# Patient Record
Sex: Female | Born: 1950 | Race: Black or African American | Hispanic: No | Marital: Married
Health system: Southern US, Community
[De-identification: ages and names within clinical notes are randomized; demographics above are authoritative.]

---

## 1998-06-10 ENCOUNTER — Other Ambulatory Visit: Admission: RE | Admit: 1998-06-10 | Discharge: 1998-06-10 | Payer: Self-pay | Admitting: Family Medicine

## 2006-01-08 ENCOUNTER — Other Ambulatory Visit: Admission: RE | Admit: 2006-01-08 | Discharge: 2006-01-08 | Payer: Self-pay | Admitting: Family Medicine

## 2008-03-01 ENCOUNTER — Ambulatory Visit: Payer: Self-pay

## 2008-03-01 ENCOUNTER — Ambulatory Visit: Payer: Self-pay | Admitting: Cardiology

## 2008-03-16 ENCOUNTER — Ambulatory Visit: Payer: Self-pay

## 2008-03-16 ENCOUNTER — Encounter: Payer: Self-pay | Admitting: Cardiology

## 2008-03-16 ENCOUNTER — Ambulatory Visit: Payer: Self-pay | Admitting: Cardiology

## 2008-03-16 LAB — CONVERTED CEMR LAB: Pro B Natriuretic peptide (BNP): 70 pg/mL (ref 0.0–100.0)

## 2010-11-28 NOTE — Letter (Signed)
March 16, 2008    [name]  [address]  [place], [place] [address]   RE:  NYNA, CHILTON  MRN:  [id number]  /  DOB:  03-Dec-1950   DICTATING PHYSICIAN:  Loralie Champagne, MD   Dear Dr. 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  The patient did  have an event monitor, with several readings available to me today.  All  of these showed normal sinus rhythm and no evidence for a tachy or brady  arrhythmia.  The patient did perform an exercise treadmill test today,  which showed a poor overall exercise tolerance.  She stopped after 3  minutes and 4 seconds due to dyspnea likely due to her underlying lung  disease.  Her baseline rhythm was sinus with an intraventricular  conduction delay and a short PR interval, which may be consistent with a  Wolff-Parkinson-White type picture.  At her maximum heart rate of 130  beats per minute, the QRS complex had shortened slightly and the PR  interval had lengthened slightly as well.  This suggests that there is  some decrement in conduction along the possible accessory pathway with  increased heart rate.  However, there does still appear to be some  conduction down the accessory pathway even at a heart rate of up to 130  beats per minute.  The patient additionally had an echocardiogram done  here in the office today, which showed normal left ventricular size and  overall normal ejection fraction; EF is estimated at 55%.  There was  paradoxical septal motion consistent with an intraventricular conduction  delay, however, the septum did thicken and does not appear to be  hypokinetic.  The right ventricle had normal size and function and there  was mild mitral and tricuspid regurgitation as well.  Finally it was  noted that on the treadmill, the patient's blood pressure rose up to a  maximum of 201/109, which is significantly elevated.  I spoke with  the  patient after all of her tests were done.  I did suggest that she start  on ablood pressure medication such as amlodipine, however, she declined  blood pressure medication.  The patient did decline followup here and  states that she will return to see you in your office.  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  She does not have LV systolic dysfunction.  She was  on event monitor for number of weeks with no events.  Per her report,  she has never been symptomatic from the standpoint of having tachy  arrhythmias due to WPW.  So, I think at this time the best course would  be to follow her with observation.   Thank you for the opportunity to care for this patient with you.   Dalton Aundra Dubin    Sincerely,      Loralie Champagne, MD  Electronically Signed    DM/MedQ  DD: 03/16/2008  DT: 03/17/2008  Job #: [id number]   CC:    Vanna Scotland. Olevia Perches, MD, [facility]

## 2010-11-28 NOTE — Procedures (Signed)
[facility]                              EXERCISE TREADMILL   NAME:Kristen Montgomery, Kristen Montgomery                        MRN:          [id number]  DATE:03/16/2008                            DOB:          1951/05/20    INDICATION:  Possible Wolff-Parkinson-White syndrome; please assess for  response of the QRS duration to exercise.   PROCEDURE NOTE:  After informed consent was obtained, the patient  exercised on the treadmill using standard Bruce protocol.  She exercised  for 3 minutes and 4 seconds, achieving 4.60 METS.  The resting heart  rate rose from 78 to 134 beats per minute, representing 81% of the  maximum predicted heart rate.  The resting blood pressure of 134/94,  rose to maximum blood pressure of 202/109.  The patient did reach stage  II of the Bruce protocol. Testing was stopped due to shortness of  breath.  During the test at baseline, the patient's EKG showed sinus  rhythm with a short PR and a widened QRs.  This pattern was thought to  be suggestive of Wolff-Parkinson-White with an accessory pathway.  With  exercise, the patient's heart rate reached up to about 130 beats per  minute.  The QRS complex did narrow somewhat with exercise suggesting a  decremental response of the accessory pathway and some degree of  refractoriness with increased heart rate.   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  The  accessory pathway does seem to be somewhat more refractory at higher  heart rates, as the QRS complex does narrow a bit, however, does not  narrow completely to normal, so there still appears to be some degree of  conduction down the accessory pathway even at higher heart rates.  Also,  it is noted the patient has very poor exercise tolerance; this is  thought to be probably due to her underlying lung disease.     Loralie Champagne, MD  Electronically Signed    DM/MedQ  DD: 03/16/2008  DT: 03/17/2008  Job #: [id number]   cc:   [name], M.D.  Bruce Alfonso Patten Olevia Perches, MD, [facility]

## 2010-11-28 NOTE — Assessment & Plan Note (Signed)
[facility]                            CARDIOLOGY OFFICE NOTE   NAME:Kristen Montgomery, Kristen Montgomery                        MRN:          [id number]  DATE:02/29/2008                            DOB:          [birth year]    PRIMARY CARE PHYSICIAN:  [name], M.D.   HISTORY OF PRESENT ILLNESS:  This is a 60 year old with history of COPD  and smoking who presents to Cardiology Clinic for evaluation of abnormal  EKG at baseline.  The patient does not have any episodes of chest pain.  She does have some dyspnea on exertion that is thought to be related to  her COPD.  If she climbs a flight of steps, she does get short of  breath.  She does not tend to have shortness of breath walking on flat  ground.  She has no orthopnea and no PND.  She was noted by her primary  care physician to have an abnormal EKG that shows a short PR interval  and a possible delta wave consistent with WPW.  She states that she gets  occasional fluttering in her chest and this happens on rare intervals  and tends to be every 6 months to a year (episodes are not common at  all).  They last for about 15 minutes at a time and are not associated  with lightheadedness or syncope.  In fact, she has never had an episode  of syncope or presyncope.  She noted that she does not have any known  heart problems.   PAST MEDICAL HISTORY:  1. COPD.  The patient is an active smoker.  She did stop smoking in      the past using Chantix.  However, she did gain a lot of weight      after she stopped smoking, so she started smoking again.  2. Possible WPW   SOCIAL HISTORY:  The patient is a smoker.  She smokes about a pack a  day.  She drinks alcohol rarely.  She does not use any illicit drugs.  She does work in a factory.  She is married.   FAMILY HISTORY:  The patient's parents are relatively healthy except for  the fact that her mother has some mild COPD.  There is no family history  of premature coronary  artery disease.   REVIEW OF SYSTEMS:  Negative except as noted in the history of present  illness.   MEDICATIONS:  The patient takes  1. Advair 250/50 b.i.d.  2. Singulair 10 mg daily.  3. Albuterol p.r.n.   EKG today normal sinus rhythm.  There is a short PR with what appears to  be a delta wave.  There is a left bundle-branch block pattern.   PHYSICAL EXAMINATION:  VITAL SIGNS:  Blood pressure 133/85, heart rate  85 and regular, weight is 145 pounds.  GENERAL:  No apparent distress.  This is a well-developed female.  NEURO:  Alert and oriented x3.  Normal affect.  LUNGS:  Distant breath sounds, but clear and normal respiratory effort.  NECK:  JVP does appear to be elevated at  about 10-12 cm of water.  There  is no thyromegaly or thyroid nodule.  ABDOMEN:  Soft, nontender.  No hepatosplenomegaly.  Normal bowel sounds.  HEART:  Regular S1 and S2, no S3, no S4.  There is a slight 1/6 systolic  murmur at the left sternal border.  There are no carotid bruits.  There  is no pedal edema.  EXTREMITIES:  No clubbing, cyanosis.  HEENT:  Normal.  SKIN:  Normal.  MUSCULOSKELETAL:  Normal.   ASSESSMENT AND PLAN:  This is a 60 year old with history of COPD and  active smoking who is found to have an abnormal EKG with left bundle-  branch pattern/short PR and possible delta wave.  1. Abnormal EKG.  The patient's EKG does appear consistent with a      Wolff-Parkinson-White pattern but she does not have a whole lot of      symptoms consistent with arrhythmia.  She does have very rare      episodes of palpitations every 65-monthto a year that are not      associated with syncope or lightheadedness.  She has never passed      out.  I do think she needs some further evaluation, given the      abnormal EKG.  I will go ahead and get an echocardiogram to look      for any structural heart abnormalities.  We will set her up with an      event monitor that she can wear for a month.  We will see if  she      has any evidence for AVRT or atrial fibrillation related to WPW.  I      somewhat doubt that we will find either given her general lack of      symptoms.  We will also obtain an exercise treadmill test to follow      her PR interval and the delta wave with exercise.  It may be that      this is weak pathway with decreasing conduction as heart rate      increases.  In this situation, we would see loss of the delta wave      at higher heart rates.  2. I did encourage the patient to stop smoking.  She does have a      prescription for Chantix at home.  I did encourage her to try using      it again as it did work to stop her smoking the first time.  3. We will check fasting lipids when she returns.  4. The patient does appear to have elevated neck veins on exam.  We      are planning on getting an echocardiogram.  We will also check a      BNP.  She does have some shortness of breath on exertion,      especially with climbing stairs, this has been related to her COPD      in the past.     DLoralie Champagne MD  Electronically Signed    DM/MedQ  DD: 03/01/2008  DT: 03/02/2008  Job #: [id number]  cc:   PJudithe Modest M.D.  Thomas C. Wall, MD, [facility]

## 2011-01-16 ENCOUNTER — Ambulatory Visit: Payer: Self-pay | Admitting: Sports Medicine

## 2011-08-13 ENCOUNTER — Ambulatory Visit: Payer: Self-pay | Admitting: Orthopedic Surgery

## 2011-08-13 DIAGNOSIS — I10 Essential (primary) hypertension: Secondary | ICD-10-CM

## 2011-08-13 LAB — BASIC METABOLIC PANEL
BUN: 11 mg/dL (ref 7–18)
Chloride: 107 mmol/L (ref 98–107)
EGFR (Non-African Amer.): >60
Glucose: 89 mg/dL (ref 65–99)
Potassium: 4.2 mmol/L (ref 3.5–5.1)
Sodium: 144 mmol/L (ref 136–145)

## 2011-08-13 LAB — CBC
HCT: 39.3 % (ref 35.0–47.0)
HGB: 13.1 g/dL (ref 12.0–16.0)
MCHC: 33.2 g/dL (ref 32.0–36.0)
RBC: 4.48 10*6/uL (ref 3.80–5.20)

## 2011-08-13 LAB — PROTIME-INR
INR: 0.9
Prothrombin Time: 12.4 secs (ref 11.5–14.7)

## 2011-08-21 ENCOUNTER — Inpatient Hospital Stay: Payer: Self-pay | Admitting: Orthopedic Surgery

## 2011-08-22 LAB — BASIC METABOLIC PANEL
Anion Gap: 9 (ref 7–16)
BUN: 9 mg/dL (ref 7–18)
Creatinine: 0.65 mg/dL (ref 0.60–1.30)
EGFR (Non-African Amer.): >60
Glucose: 103 mg/dL — ABNORMAL HIGH (ref 65–99)
Osmolality: 271 (ref 275–301)
Potassium: 5 mmol/L (ref 3.5–5.1)

## 2011-08-22 LAB — PLATELET COUNT: Platelet: 208 10*3/uL (ref 150–440)

## 2012-05-02 IMAGING — CT CT CHEST W/ CM
1 series · 15 of 32 positions shown, 19 images · IV contrast (APPLIED)
Comparison: none

REASON FOR EXAM: acute hypxia
COMMENTS:

[Series 4: soft tissue · axial · 0.64mm/px · z∈[-31,+230]mm · 15 of 99 slices shown, 19 images]
[im 8/99  mediastinal]
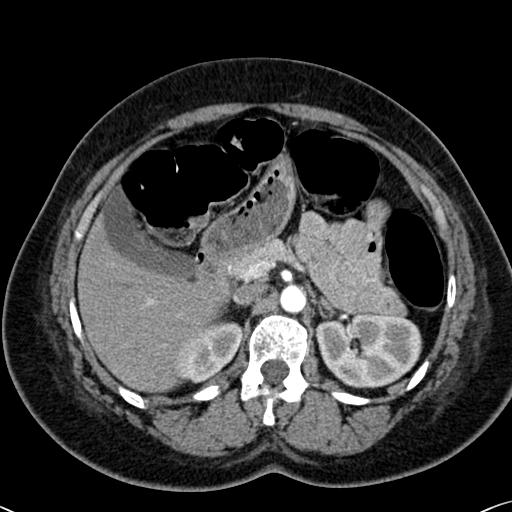
[im 8/99  lung]
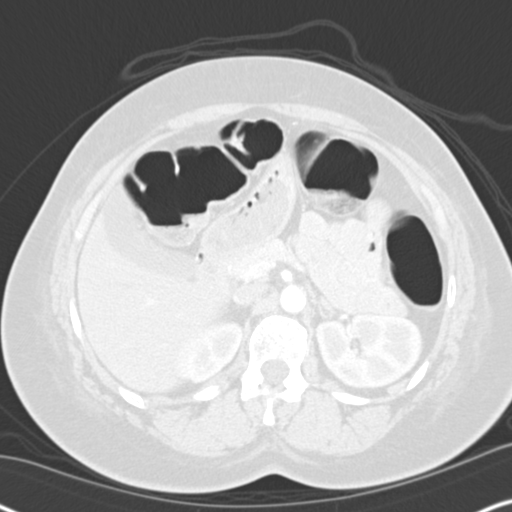
[im 15/99  lung]
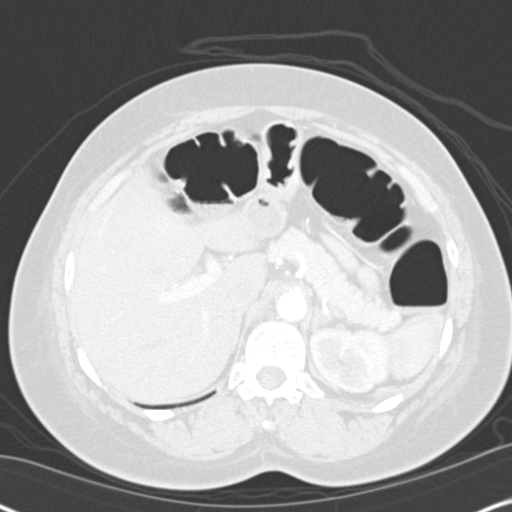
[im 20/99  lung]
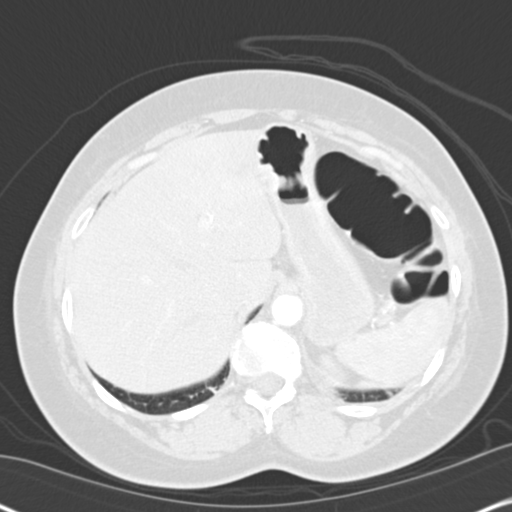
[im 26/99  lung]
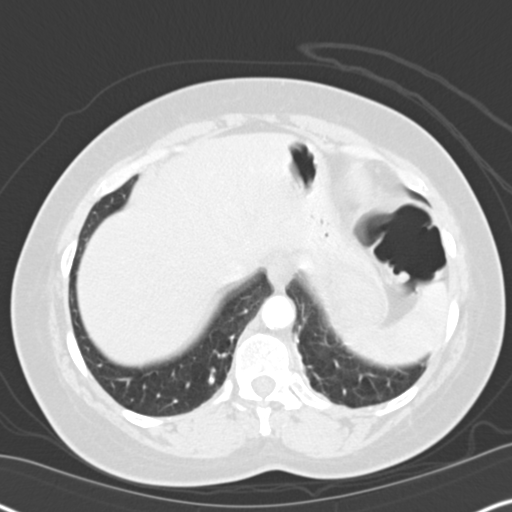
[im 33/99  mediastinal]
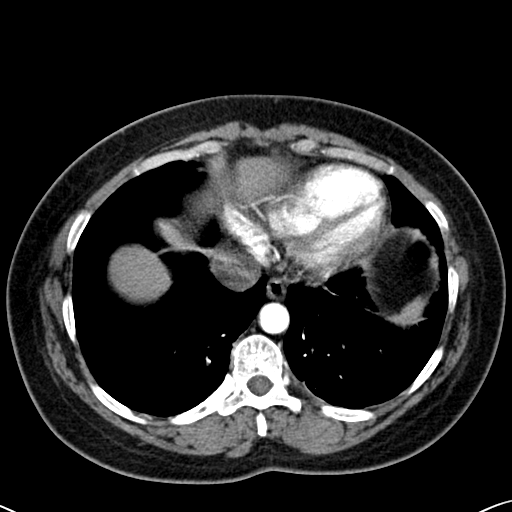
[im 33/99  lung]
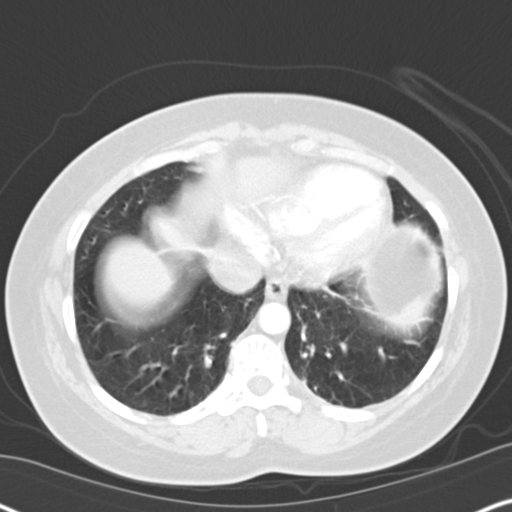
[im 40/99  lung]
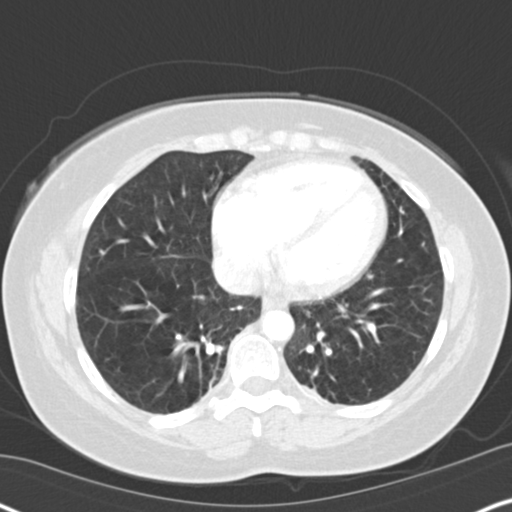
[im 47/99  lung]
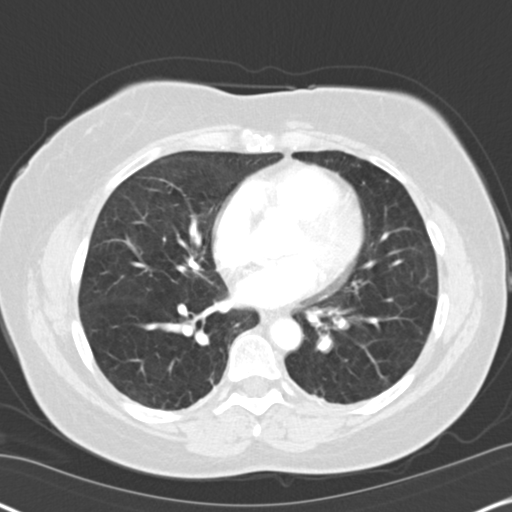
[im 51/99  lung]
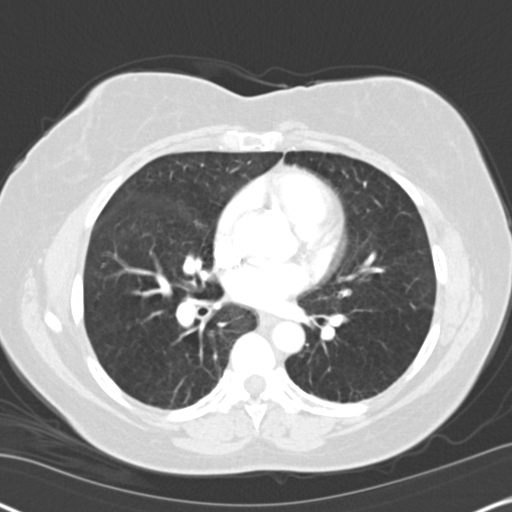
[im 55/99  mediastinal]
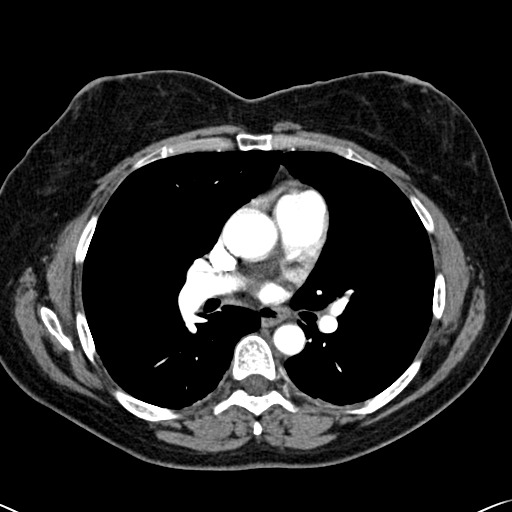
[im 55/99  lung]
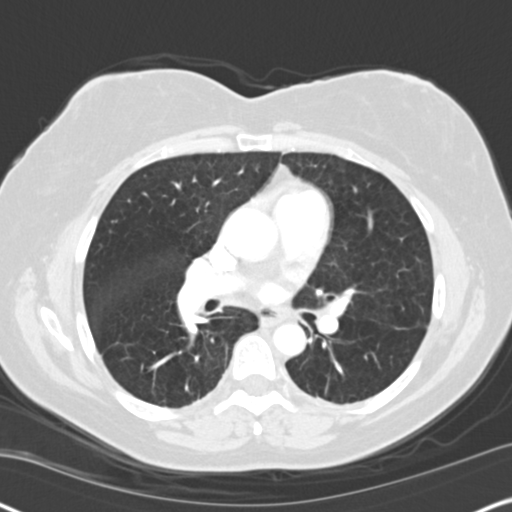
[im 62/99  lung]
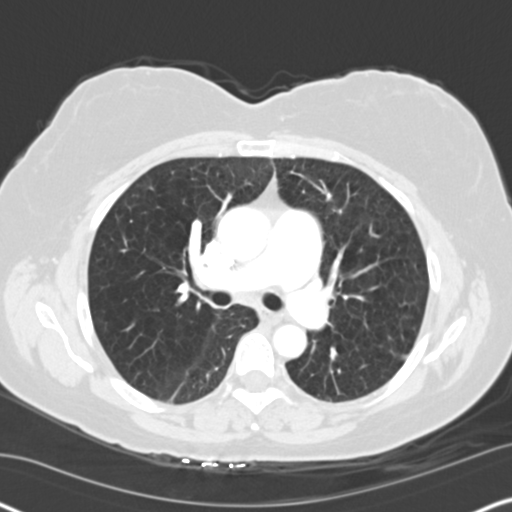
[im 69/99  lung]
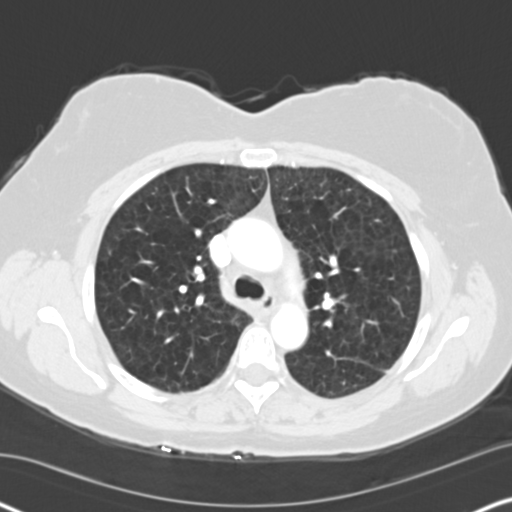
[im 77/99  lung]
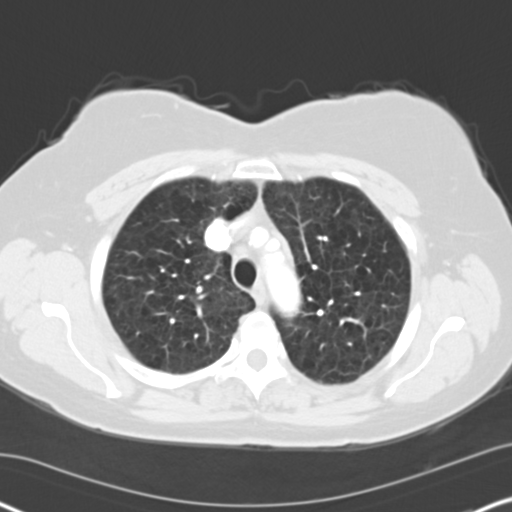
[im 80/99  mediastinal]
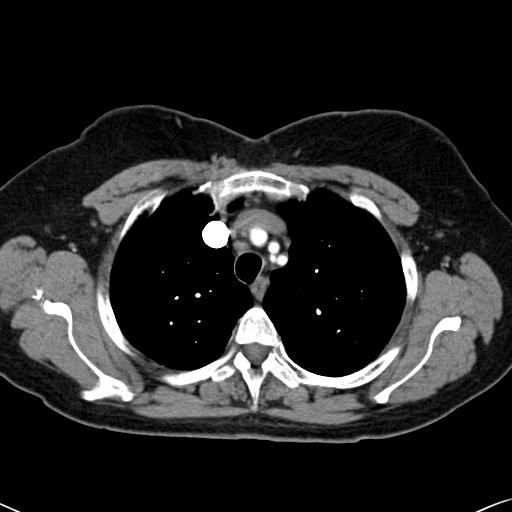
[im 80/99  lung]
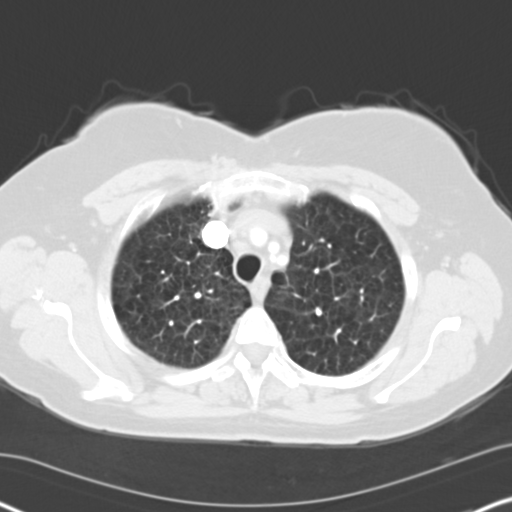
[im 88/99  lung]
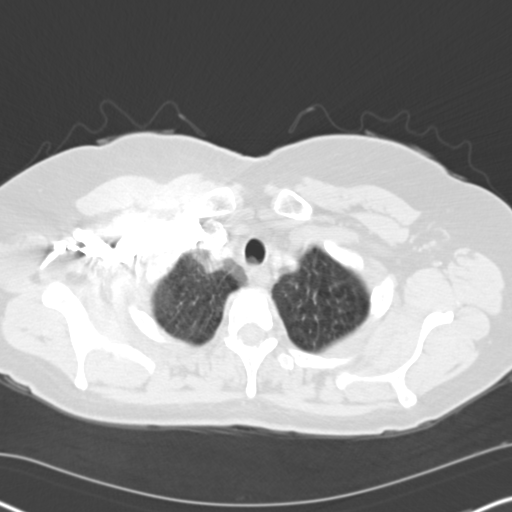
[im 95/99  lung]
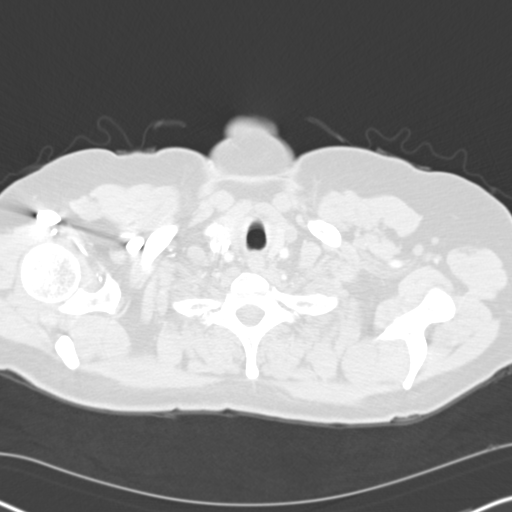

[15 of 32 positions shown; findings below may reference images not displayed]

PROCEDURE:     CT  - CT CHEST (FOR PE) W  - August 23, 2011  [DATE]

RESULT:     Axial CT scanning was performed through the chest with
reconstructions at 3 mm intervals and slice thicknesses following
intravenous administration of 75 cc of [id number]. Review of multiplanar
reconstructed images was performed separately on the VIA monitor.

Contrast within the pulmonary arterial tree is normal in appearance. I see
no filling defects to suggest an acute pulmonary embolism. The caliber of
the thoracic aorta is normal. The cardiac chambers are normal in size. The
thoracic esophagus exhibits no abnormal mural thickening. No pathologic
sized mediastinal or hilar lymph nodes are present. There is no pleural nor
pericardial effusion.

At lung window settings there are emphysematous changes bilaterally. There
is a 2 mm diameter subpleural nodule in the lateral aspect of the left lower
lobe on image 56. I see no alveolar infiltrates.

The thoracic vertebral bodies are preserved in height. Within the upper
abdomen the observed portions of the liver and spleen are normal in
appearance. I see no adrenal masses.
IMPRESSION: 1. I do not see evidence of an acute pulmonary embolism.
2. There is no evidence of CHF nor acute thoracic aortic pathology.
3. I see no mediastinal nor hilar lymphadenopathy.
4. There are emphysematous changes in both lungs.

## 2012-05-02 IMAGING — CR DG CHEST 2V
1 series · 2 of 2 positions shown · non-contrast
Comparison: none

REASON FOR EXAM: post op low O2 sats, COPD, smoker
COMMENTS:

PROCEDURE:     DXR - DXR CHEST PA (OR AP) AND LATERAL  - August 23, 2011 [DATE]
RESULT:     The lung fields are clear. The heart, mediastinal and osseous
structures show no acute changes. There is incidentally noted a slight
thoracic scoliosis with the convexity to the right.

[Series 1: pa · 0.17mm/px · 2 of 2 slices shown]
[im 1/2]
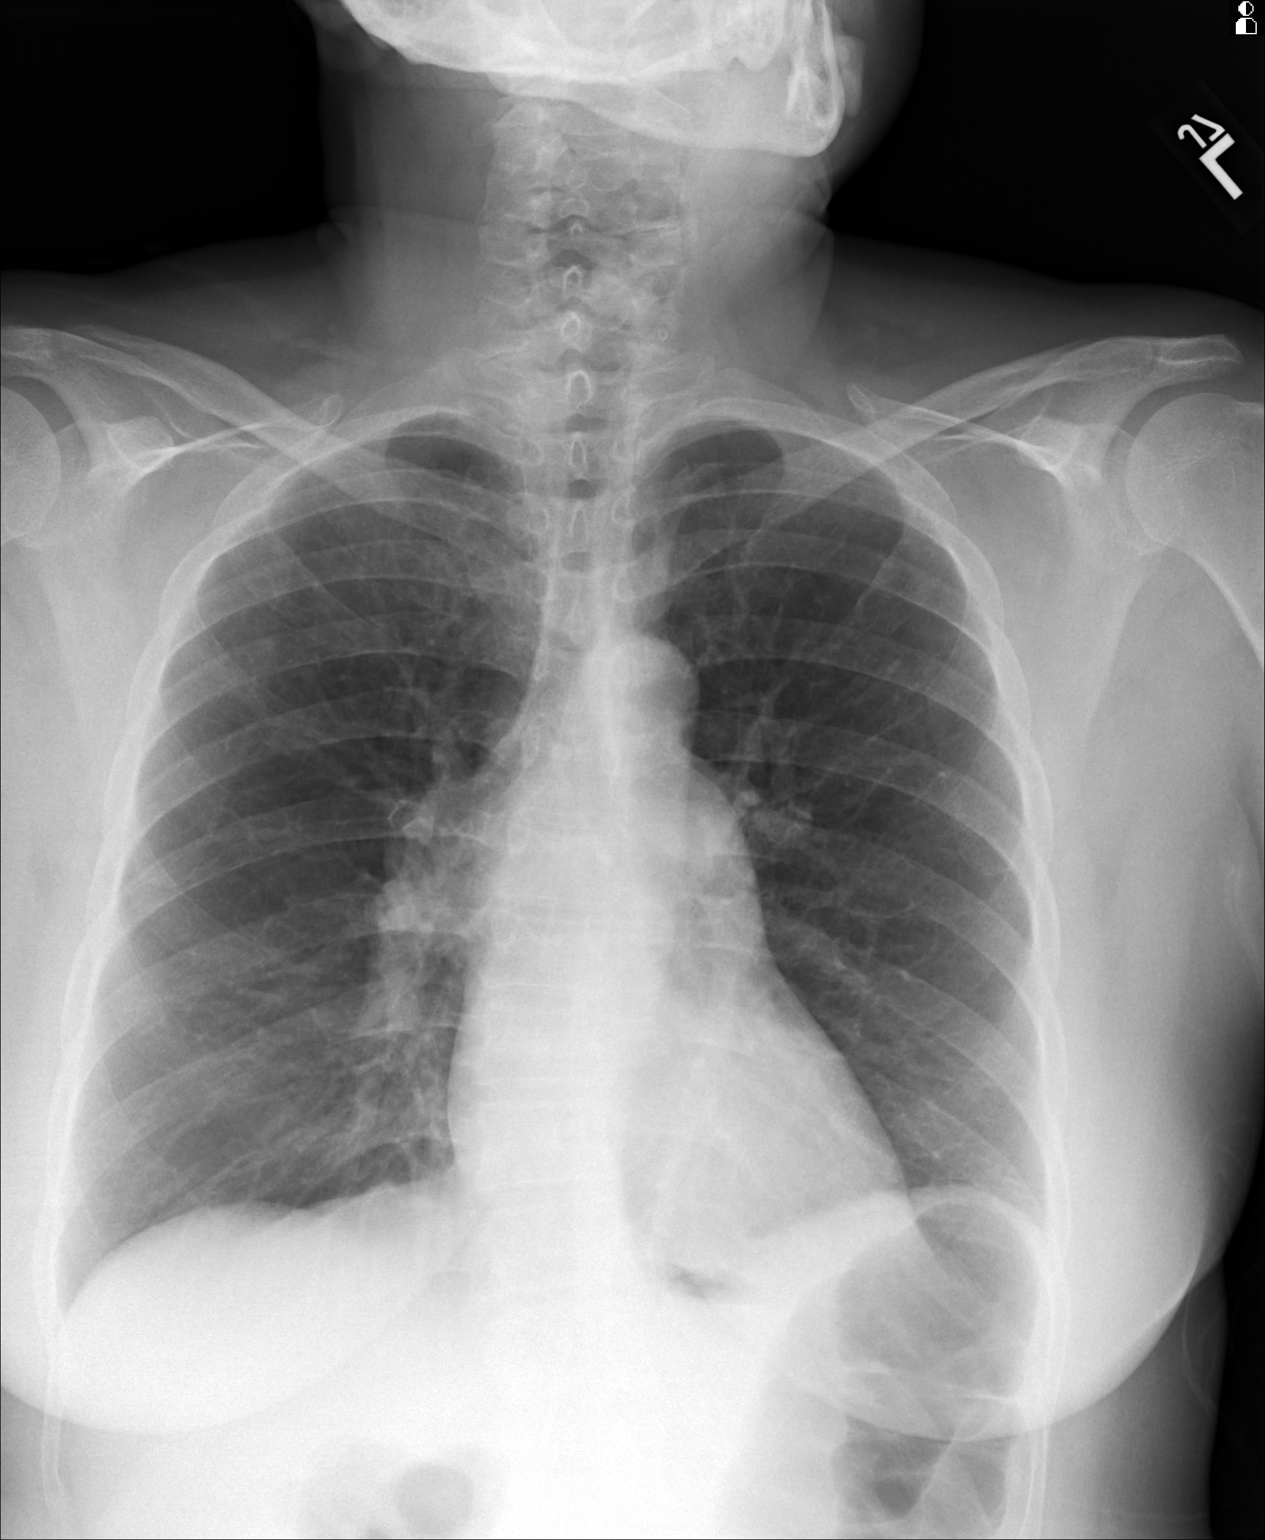
[im 2/2]
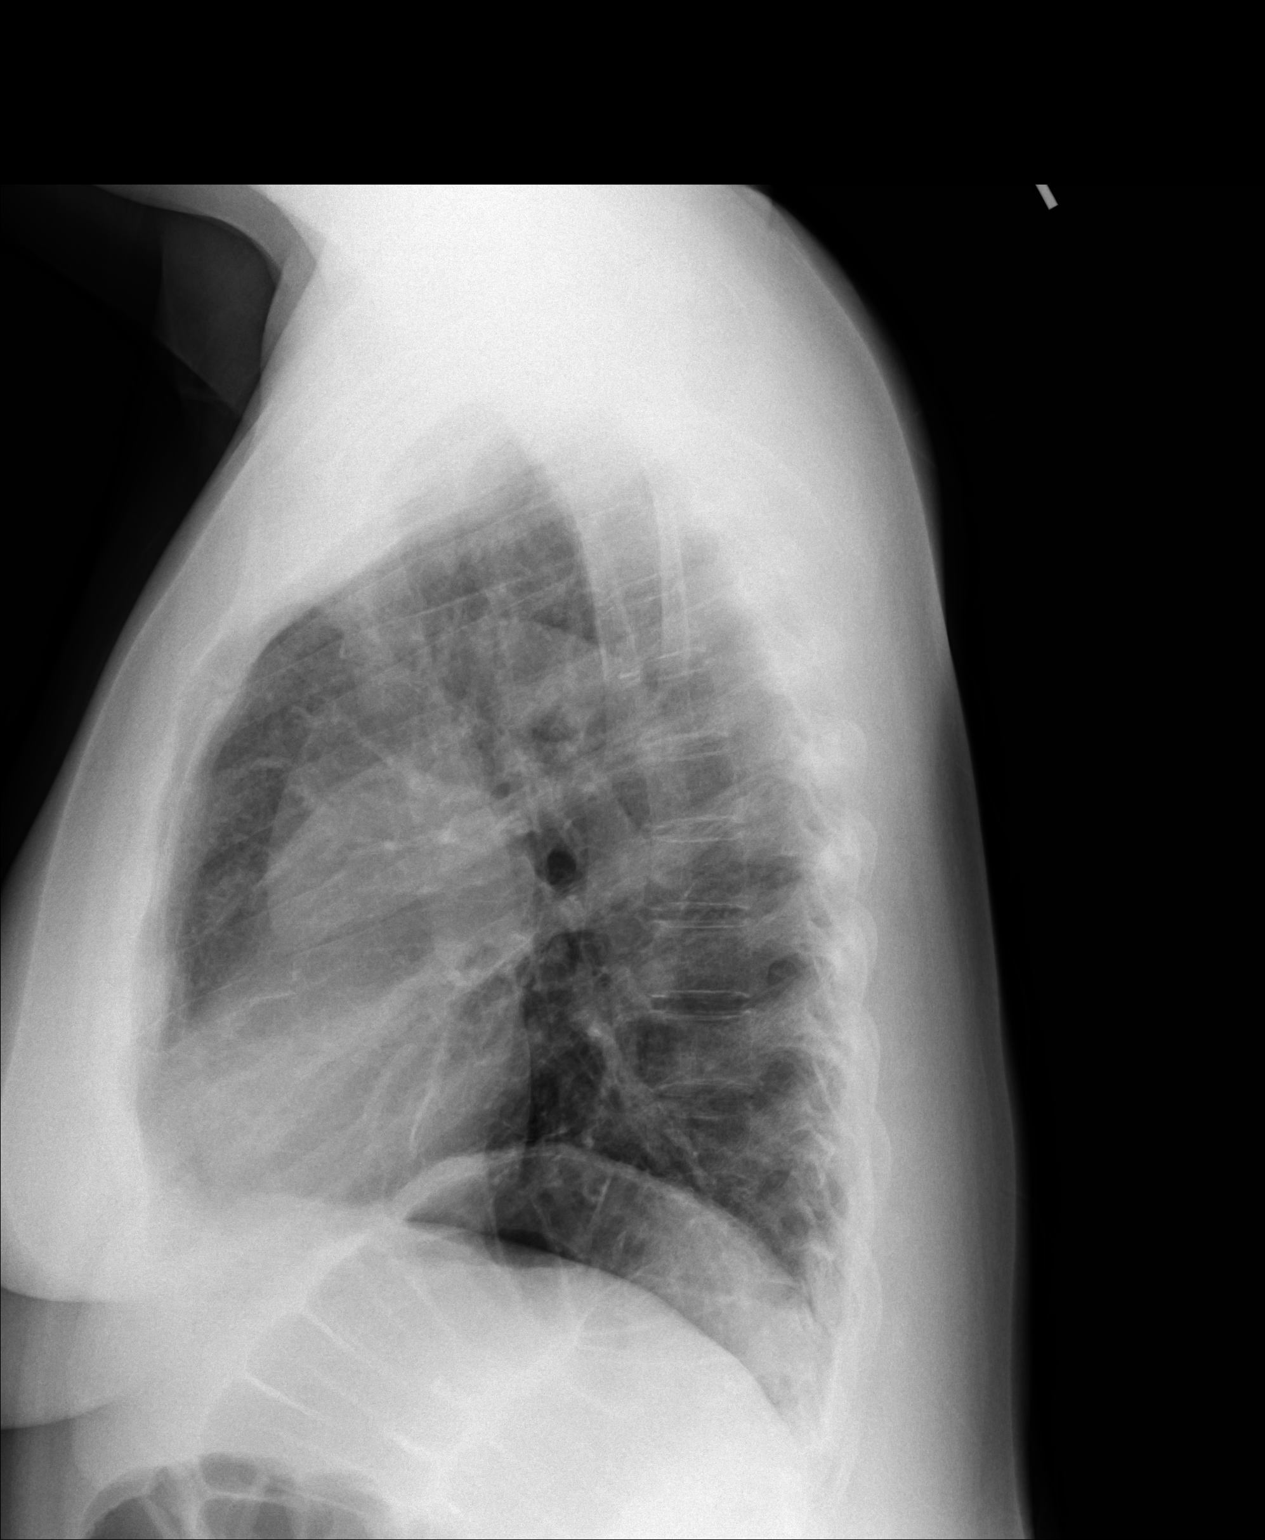

[2 of 2 positions shown; findings below may reference images not displayed]

IMPRESSION: 1.     No acute changes are identified.

## 2013-01-30 ENCOUNTER — Telehealth: Payer: Self-pay | Admitting: Cardiovascular Disease

## 2013-01-30 NOTE — Telephone Encounter (Signed)
Patient spoke with Dr. Claiborne Billings nurse the other week about a CPAP machine and was told she would receive a call back.  Has not heard anything else about getting a machine.

## 2013-01-30 NOTE — Telephone Encounter (Signed)
Kristen Montgomery i do not recall speaking with this lady. I don't see in epic that she has spoken with anyone. Can you call her back and try to get more information. I don't know what to do.

## 2013-02-05 ENCOUNTER — Telehealth: Payer: Self-pay | Admitting: Cardiovascular Disease

## 2013-02-05 NOTE — Telephone Encounter (Signed)
This patient's phone number has been d/c.

## 2013-04-17 DIAGNOSIS — I272 Pulmonary hypertension, unspecified: Secondary | ICD-10-CM | POA: Insufficient documentation

## 2013-04-17 DIAGNOSIS — J449 Chronic obstructive pulmonary disease, unspecified: Secondary | ICD-10-CM | POA: Insufficient documentation

## 2013-09-22 DIAGNOSIS — I456 Pre-excitation syndrome: Secondary | ICD-10-CM | POA: Insufficient documentation

## 2013-11-09 NOTE — Telephone Encounter (Signed)
This encounter is closed.

## 2014-06-03 DIAGNOSIS — D649 Anemia, unspecified: Secondary | ICD-10-CM | POA: Insufficient documentation

## 2014-06-03 DIAGNOSIS — J9622 Acute and chronic respiratory failure with hypercapnia: Secondary | ICD-10-CM | POA: Insufficient documentation

## 2014-06-03 DIAGNOSIS — I10 Essential (primary) hypertension: Secondary | ICD-10-CM | POA: Insufficient documentation

## 2014-07-12 ENCOUNTER — Encounter: Payer: Self-pay | Admitting: Internal Medicine

## 2014-07-16 ENCOUNTER — Encounter: Payer: Self-pay | Admitting: Internal Medicine

## 2014-08-16 ENCOUNTER — Encounter: Payer: Self-pay | Admitting: Internal Medicine

## 2014-09-14 ENCOUNTER — Encounter
Admit: 2014-09-14 | Disposition: A | Discharge status: Home / Self Care | Payer: Self-pay | Attending: Internal Medicine | Admitting: Internal Medicine

## 2014-10-15 ENCOUNTER — Encounter
Admit: 2014-10-15 | Disposition: A | Discharge status: Home / Self Care | Payer: Self-pay | Attending: Internal Medicine | Admitting: Internal Medicine

## 2014-11-07 NOTE — Op Note (Signed)
PATIENT NAME:  Kristen Montgomery, WOODROW MR#:  [id number] DATE OF BIRTH:  [birth year]  DATE OF PROCEDURE:  08/21/2011  PREOPERATIVE DIAGNOSIS: Right hip avascular necrosis.   POSTOPERATIVE DIAGNOSIS: Right hip avascular necrosis.   PROCEDURE: Right total hip arthroplasty.   SURGEON: Laurene Footman, MD   ASSISTANT: Randon Goldsmith, PA-C    ANESTHESIA: Spinal.   DESCRIPTION OF PROCEDURE: The patient was brought to the operating room and after adequate spinal anesthesia was obtained the patient was transferred to the operative table with the left leg in a well legholder, the right leg in a Medacta traction boot. C-arm was brought in and adequate visualization was obtained with initial x-ray printed. Next, the hip was prepped and draped using the barrier drape and after bringing the C-arm in the landmarks were identified to the greater trochanter as well as on the AP the tip of the trochanter for skin incision. A 10 cm incision was made in an oblique fashion in line with the tensor fascia lata. Subcutaneous tissue was incised with hemostasis being achieved with electrocautery. The tensor fascia muscle was identified and the superficial fascia incised. The muscle was retracted laterally and the deep fascia incised. The rectus fascia was incised and then the deep rectus fascia incised. The lateral circumflex vessel was identified but did not cross the operative field and was left alone. A modified Hohmann retractor was then placed anteriorly and a capsulotomy performed anteriorly with a large amount of joint fluid present within the joint consistent with avascular necrosis. A modified Charnley was then placed and with the leg in slight external rotation and C-arm to check position, femoral neck cut was carried out and the head was removed without difficulty. The cartilage did not appear to have displaced at this point. There was significant synovitis within the joint and reaming was carried out after measuring the  head. With reaming to 48 mm, there was very good bony surface in the acetabulum for positioning. A 48 mm Versa fit cup DM from Medacta was implanted after trial component fit very well with pullout type test. Next, the posterior capsule was partially incised to allow for external rotation and pulling the greater trochanter out and exposure of the proximal femur with the leg dropped into extension, externally rotated and adducted. Starter reamer was used after first using the box osteotome. The 0 rasp was then inserted and when it was placed in the appropriate position trial components were placed and this gave a very tight fit proximally and appeared to be the appropriate sized implant. The 0 stem was then impacted along with a short head and the assembled 48 mm DM liner 28 mm diameter with a small -3.5 28 mm head. These components were assembled and inserted onto the 0 stem, impacted, and the hip reduced. There was no capsule interposed and the hip appeared stable on C-arm views. At this point printed picture showed restoration of leg length. The wound was thoroughly irrigated and closed with running quill suture for the tensor fascia. A deep drain was placed followed by a running subcutaneous quill suture and skin staples. Xeroform, 4 x 4's, ABDs, and tape were applied. Prior to closure, the wound had been infiltrated with 10 mg of morphine, 3 mL 0.25% Sensorcaine with epinephrine, and 50 mg Toradol.   ESTIMATED BLOOD LOSS: 100. Cell Saver was used but not required.   SPECIMENS: Femoral head.  ____________________________ Laurene Footman, MD mjm:drc D: 08/21/2011 21:13:10 ET T: 08/22/2011 09:13:00 ET JOB#: [id number]  cc: Laurene Footman, MD, <Dictator> Christian Mate [facility] MD ELECTRONICALLY SIGNED 08/22/2011 13:08

## 2014-11-07 NOTE — Discharge Summary (Signed)
PATIENT NAME:  Kristen Montgomery, Kristen Montgomery MR#:  [id number] DATE OF BIRTH:  [birth year]  DATE OF ADMISSION:  08/21/2011 DATE OF DISCHARGE:  08/24/2011  ADMITTING DIAGNOSIS: Status post right total hip arthroplasty for avascular necrosis.   DISCHARGE DIAGNOSES: 1. Status post right total hip arthroplasty for avascular necrosis.  2. Acute hypoxic respiratory failure.  3. History of chronic obstructive pulmonary disease.   ATTENDING: Hessie Knows, MD with [place]    CONSULTING PHYSICIAN: Dr. Devona Konig     PROCEDURE: The patient underwent right total hip arthroplasty by Dr. Rudene Christians on 08/21/2011. Assistant was Dorthula Matas, PA-C.   ANESTHESIA: Spinal.   ESTIMATED BLOOD LOSS: 100 mL.   SPECIMEN: Femoral head specimen was sent.   OPERATIVE FINDINGS: Joint synovitis.  IMPLANTS: Medacta.   DRAINS: Hemovac drain was placed.   COMPLICATIONS: No complications occurred.   PATIENT HISTORY: Kristen Montgomery is a 64 year old with history of bilateral hip AVN. Her right hip is starting to hurt quite a bit more. She has pain with activity and pain at rest. It bothers her just getting into the office today from the exam room walking in. In fact, the patient is doing very little walking around the house at this point. She has fallen secondary to her hip pain as well. She has pain when trying to sleep and sometimes it does wake her.   ALLERGIES AND ADVERSE REACTIONS: None.   PAST MEDICAL HISTORY:  1. Chronic obstructive pulmonary disease.  2. Hypertension.  3. Cataracts.  4. Chickenpox.   PHYSICAL EXAMINATION: HEART: Regular rate and rhythm. LUNGS: Clear to auscultation. RIGHT HIP: 0 degrees internal rotation, 25 degrees external rotation without flexion contracture. She is neurovascularly intact. X-rays revealed progression of avascular necrosis in both hips with involvement of entire right femoral head with some early collapse apparent. In the left hip there has been some progression  but no collapse.   HOSPITAL COURSE: The patient underwent the aforementioned procedure to her right hip without complication and was transferred to PAC-U and then the orthopedic floor on February 5th. First day postop she did have quite a bit of pain. Celebrex was added to her pain regimen. Her right hip dressing was intact. Her Hemovac drain was pulled. She was treated with TED hose and Xarelto for DVT prophylaxis while here. Hemoglobin was 11.1. She would not require transfusion while here. On postop day two, she underwent right hip dressing change and her incision site was intact with staples and no sign of infection. Unfortunately, on postop day two the patient would have to be seen by Dr. Humphrey Rolls because she was hypoxic with coming off the nasal cannula oxygen. She did not have any chest pain but a little shortness of breath. Dr. Humphrey Rolls had opted to look for PE on chest CT or DVT with lower extremity Doppler's but these tests were both negative for pathology. Dr. Humphrey Rolls had added Spiriva to her medication regimen. She is currently on Advair and albuterol. In the end she would be fine. Her sats are low normal on room air. She did have a bit of hypotension on postop day three but was feeling fine. She had walked quite a good ways with therapy. She tolerated her diet well while here and did report having a large bowel movement on the 7th.    CONDITION AT DISCHARGE: Stable.   DISPOSITION: Home with home health PT.   DISCHARGE MEDICATIONS:  1. Oxycodone 5 mg 1 to 2 every four hours as needed for pain.  2. Xarelto 10 mg a day until finished.   DISCHARGE INSTRUCTIONS AND FOLLOW-UP: 1. Regular diet.  2. Weightbearing as tolerated on the right leg.  3. She is to wear knee-high high TED hose during the day.  4. Resume home medications and take multivitamin.  5. The patient will leave her right hip dressing on and keep it clean and dry. She will call us for any disturbing symptoms.  6. She will call our office  to confirm two week follow-up appointment.  7. She will follow-up with Dr. Devona Konig soon concerning her lung issues.   ____________________________ Jerrel Ivory Charlett Nose, [place] jrp:drc D: 08/24/2011 16:47:10 ET T: 08/25/2011 10:36:43 ET JOB#: [id number]  cc: Jerrel Ivory. Charlett Nose, [place], <Dictator> Jerrel Ivory Marlow Hendrie PA ELECTRONICALLY SIGNED 08/28/2011 12:46

## 2015-10-21 DIAGNOSIS — J449 Chronic obstructive pulmonary disease, unspecified: Secondary | ICD-10-CM | POA: Diagnosis not present

## 2015-10-29 DIAGNOSIS — I270 Primary pulmonary hypertension: Secondary | ICD-10-CM | POA: Diagnosis not present

## 2015-11-02 DIAGNOSIS — J9610 Chronic respiratory failure, unspecified whether with hypoxia or hypercapnia: Secondary | ICD-10-CM | POA: Diagnosis not present

## 2015-11-02 DIAGNOSIS — J449 Chronic obstructive pulmonary disease, unspecified: Secondary | ICD-10-CM | POA: Diagnosis not present

## 2015-11-03 DIAGNOSIS — Z9981 Dependence on supplemental oxygen: Secondary | ICD-10-CM | POA: Diagnosis not present

## 2015-11-03 DIAGNOSIS — J441 Chronic obstructive pulmonary disease with (acute) exacerbation: Secondary | ICD-10-CM | POA: Diagnosis not present

## 2015-11-15 DIAGNOSIS — I272 Other secondary pulmonary hypertension: Secondary | ICD-10-CM | POA: Diagnosis not present

## 2015-11-15 DIAGNOSIS — J449 Chronic obstructive pulmonary disease, unspecified: Secondary | ICD-10-CM | POA: Diagnosis not present

## 2015-11-20 DIAGNOSIS — J449 Chronic obstructive pulmonary disease, unspecified: Secondary | ICD-10-CM | POA: Diagnosis not present

## 2015-11-26 DIAGNOSIS — I270 Primary pulmonary hypertension: Secondary | ICD-10-CM | POA: Diagnosis not present

## 2015-12-02 DIAGNOSIS — J9610 Chronic respiratory failure, unspecified whether with hypoxia or hypercapnia: Secondary | ICD-10-CM | POA: Diagnosis not present

## 2015-12-02 DIAGNOSIS — J449 Chronic obstructive pulmonary disease, unspecified: Secondary | ICD-10-CM | POA: Diagnosis not present

## 2015-12-20 DIAGNOSIS — N182 Chronic kidney disease, stage 2 (mild): Secondary | ICD-10-CM | POA: Diagnosis not present

## 2015-12-20 DIAGNOSIS — E782 Mixed hyperlipidemia: Secondary | ICD-10-CM | POA: Diagnosis not present

## 2015-12-20 DIAGNOSIS — I129 Hypertensive chronic kidney disease with stage 1 through stage 4 chronic kidney disease, or unspecified chronic kidney disease: Secondary | ICD-10-CM | POA: Diagnosis not present

## 2015-12-21 DIAGNOSIS — J449 Chronic obstructive pulmonary disease, unspecified: Secondary | ICD-10-CM | POA: Diagnosis not present

## 2015-12-25 DIAGNOSIS — I270 Primary pulmonary hypertension: Secondary | ICD-10-CM | POA: Diagnosis not present

## 2015-12-26 DIAGNOSIS — J9610 Chronic respiratory failure, unspecified whether with hypoxia or hypercapnia: Secondary | ICD-10-CM | POA: Diagnosis not present

## 2015-12-26 DIAGNOSIS — J449 Chronic obstructive pulmonary disease, unspecified: Secondary | ICD-10-CM | POA: Diagnosis not present

## 2015-12-26 DIAGNOSIS — I270 Primary pulmonary hypertension: Secondary | ICD-10-CM | POA: Diagnosis not present

## 2015-12-26 DIAGNOSIS — Z9981 Dependence on supplemental oxygen: Secondary | ICD-10-CM | POA: Diagnosis not present

## 2016-01-02 DIAGNOSIS — J449 Chronic obstructive pulmonary disease, unspecified: Secondary | ICD-10-CM | POA: Diagnosis not present

## 2016-01-20 DIAGNOSIS — J449 Chronic obstructive pulmonary disease, unspecified: Secondary | ICD-10-CM | POA: Diagnosis not present

## 2016-01-22 DIAGNOSIS — I270 Primary pulmonary hypertension: Secondary | ICD-10-CM | POA: Diagnosis not present

## 2016-01-23 DIAGNOSIS — J441 Chronic obstructive pulmonary disease with (acute) exacerbation: Secondary | ICD-10-CM | POA: Diagnosis not present

## 2016-01-23 DIAGNOSIS — Z9981 Dependence on supplemental oxygen: Secondary | ICD-10-CM | POA: Diagnosis not present

## 2016-02-19 DIAGNOSIS — I270 Primary pulmonary hypertension: Secondary | ICD-10-CM | POA: Diagnosis not present

## 2016-02-20 DIAGNOSIS — J449 Chronic obstructive pulmonary disease, unspecified: Secondary | ICD-10-CM | POA: Diagnosis not present

## 2016-02-28 DIAGNOSIS — I361 Nonrheumatic tricuspid (valve) insufficiency: Secondary | ICD-10-CM | POA: Diagnosis not present

## 2016-02-28 DIAGNOSIS — I517 Cardiomegaly: Secondary | ICD-10-CM | POA: Diagnosis not present

## 2016-02-28 DIAGNOSIS — J449 Chronic obstructive pulmonary disease, unspecified: Secondary | ICD-10-CM | POA: Diagnosis not present

## 2016-02-28 DIAGNOSIS — I272 Other secondary pulmonary hypertension: Secondary | ICD-10-CM | POA: Diagnosis not present

## 2016-03-16 DIAGNOSIS — M879 Osteonecrosis, unspecified: Secondary | ICD-10-CM | POA: Diagnosis not present

## 2016-03-16 DIAGNOSIS — M25552 Pain in left hip: Secondary | ICD-10-CM | POA: Diagnosis not present

## 2016-03-16 DIAGNOSIS — M25551 Pain in right hip: Secondary | ICD-10-CM | POA: Diagnosis not present

## 2016-03-18 DIAGNOSIS — I270 Primary pulmonary hypertension: Secondary | ICD-10-CM | POA: Diagnosis not present

## 2016-03-20 ENCOUNTER — Encounter: Payer: Medicare Other | Attending: Internal Medicine | Admitting: *Deleted

## 2016-03-20 VITALS — Ht 59.0 in | Wt 188.4 lb

## 2016-03-20 DIAGNOSIS — I272 Other secondary pulmonary hypertension: Secondary | ICD-10-CM | POA: Insufficient documentation

## 2016-03-20 DIAGNOSIS — J449 Chronic obstructive pulmonary disease, unspecified: Secondary | ICD-10-CM | POA: Diagnosis not present

## 2016-03-20 NOTE — Progress Notes (Signed)
Daily Session Note  Patient Details  Name: Kristen Montgomery MRN: [id number] Date of Birth: 04-Mar-1951 Referring Provider:    Encounter Date: 03/20/2016  Check In:     Session Check In - 03/20/16 1248      Check-In   Location [facility]   Staff Present Carson Myrtle, BS, RRT, Respiratory Therapist;Pilar Corrales, RN, BSN   Supervising physician immediately available to respond to emergencies [facility] immediately available ER MD   Physician(s) Dr. Jimmye Norman and Dr. Corky Downs   Medication changes reported     Yes   Fall or balance concerns reported    Yes   Comments "Kristen Montgomery is deconditioned so a fall risk"   Warm-up and Cool-down Not performed (comment)  6 minute walk test done.   Resistance Training Performed No   VAD Patient? No     Pain Assessment   Currently in Pain? No/denies         Goals Met:  Personal goals reviewed  Goals Unmet:  Not Applicable  Comments:     Dr. Emily Filbert is Medical Director for [facility] and [facility].

## 2016-03-20 NOTE — Progress Notes (Signed)
Pulmonary Individual Treatment Plan  Patient Details  Name: Kristen Montgomery MRN: [id number] Date of Birth: [birth year] Referring Provider:    Initial Encounter Date:   Visit Diagnosis: No diagnosis found.  Patient's Home Medications on Admission:  Current Outpatient Prescriptions:  .  albuterol (VENTOLIN HFA) 108 (90 Base) MCG/ACT inhaler, INHALE 2 PUFFS BY MOUTH EVERY 4 TO 6 HOURS AS NEEDED FOR SHORTNESS OF BREATH, COUGH OR WHEEZE, Disp: , Rfl:  .  docusate sodium (COLACE) 100 MG capsule, Take 100 mg by mouth., Disp: , Rfl:  .  furosemide (LASIX) 40 MG tablet, Take 40 mg by mouth., Disp: , Rfl:  .  tiotropium (SPIRIVA HANDIHALER) 18 MCG inhalation capsule, INHALE 1 CAPSULE VIA HANDIHALER ONCE DAILY AT THE SAME TIME EVERY DAY, Disp: , Rfl:  .  traZODone (DESYREL) 50 MG tablet, TAKE 1 TABLET BY MOUTH AT BEDTIME AS NEEDED, Disp: , Rfl:  .  Treprostinil (TYVASO) 0.6 MG/ML SOLN, Inhale 3-9 breaths four times daily., Disp: , Rfl:  .  albuterol (ACCUNEB) 0.63 MG/3ML nebulizer solution, Inhale 1 ampule by nebulization every six (6) hours as needed for wheezing., Disp: , Rfl:  .  aspirin EC 81 MG tablet, Take by mouth., Disp: , Rfl:  .  carvedilol (COREG) 3.125 MG tablet, Take 3.125 mg by mouth., Disp: , Rfl:  .  losartan (COZAAR) 50 MG tablet, Take 50 mg by mouth 1 day or 1 dose., Disp: , Rfl:  .  omeprazole (PRILOSEC) 20 MG capsule, Take 20 mg by mouth., Disp: , Rfl:  .  potassium chloride SA (K-DUR,KLOR-CON) 20 MEQ tablet, Take 10 mEq by mouth., Disp: , Rfl:   Past Medical History: No past medical history on file.  Tobacco Use: History  Smoking Status  . Not on file  Smokeless Tobacco  . Not on file    Labs: Recent Review Flowsheet Data    There is no flowsheet data to display.       ADL UCSD:   Pulmonary Function Assessment:   Exercise Target Goals:    Exercise Program Goal: Individual exercise prescription set with THRR, safety & activity barriers. Participant  demonstrates ability to understand and report RPE using BORG scale, to self-measure pulse accurately, and to acknowledge the importance of the exercise prescription.  Exercise Prescription Goal: Starting with aerobic activity 30 plus minutes a day, 3 days per week for initial exercise prescription. Provide home exercise prescription and guidelines that participant acknowledges understanding prior to discharge.  Activity Barriers & Risk Stratification:   6 Minute Walk:   Initial Exercise Prescription:   Perform Capillary Blood Glucose checks as needed.  Exercise Prescription Changes:   Exercise Comments:   Discharge Exercise Prescription (Final Exercise Prescription Changes):    Nutrition:  Target Goals: Understanding of nutrition guidelines, daily intake of sodium <1561m, cholesterol <2028m calories 30% from fat and 7% or less from saturated fats, daily to have 5 or more servings of fruits and vegetables.  Biometrics:    Nutrition Therapy Plan and Nutrition Goals:   Nutrition Discharge: Rate Your Plate Scores:   Psychosocial: Target Goals: Acknowledge presence or absence of depression, maximize coping skills, provide positive support system. Participant is able to verbalize types and ability to use techniques and skills needed for reducing stress and depression.  Initial Review & Psychosocial Screening:     Initial Psych Review & Screening - 03/20/16 1253      Initial Review   Current issues with Current Stress Concerns   Comments Kristen Montgomery  that she had tried to exercise in the [facility] but her father coded upstairs in the hospital and died. Kristen Montgomery said she had not been back to exercise since then. Kristen Montgomery said that she knows she needs a hip surgery but she is not sure when the doctor can do it. We discussed that Pulm Rehab can get her strength up for her upcoming hip surgery.      Family Dynamics   Good Support System? Yes   Concerns Recent loss of  significant other   Comments Her father died since she has attended Pulm REhab last     Barriers   Psychosocial barriers to participate in program The patient should benefit from training in stress management and relaxation.     Screening Interventions   Interventions Encouraged to exercise      Quality of Life Scores:   PHQ-9: Recent Review Flowsheet Data    Depression screen Kristen Montgomery   Decreased Interest 0   Down, Depressed, Hopeless 0   PHQ - 2 Score 0   Altered sleeping 0   Tired, decreased energy 0   Change in appetite 0   Feeling bad or failure about yourself  0   Trouble concentrating 0   Moving slowly or fidgety/restless 0   Suicidal thoughts 0   PHQ-9 Score 0      Psychosocial Evaluation and Intervention:   Psychosocial Re-Evaluation:  Education: Education Goals: Education classes will be provided on a weekly basis, covering required topics. Participant will state understanding/return demonstration of topics presented.  Learning Barriers/Preferences:   Education Topics: Initial Evaluation Education: - Verbal, written and demonstration of respiratory meds, RPE/PD scales, oximetry and breathing techniques. Instruction on use of nebulizers and MDIs: cleaning and proper use, rinsing mouth with steroid doses and importance of monitoring MDI activations.   General Nutrition Guidelines/Fats and Fiber: -Group instruction provided by verbal, written material, models and posters to present the general guidelines for heart healthy nutrition. Gives an explanation and review of dietary fats and fiber.   Controlling Sodium/Reading Food Labels: -Group verbal and written material supporting the discussion of sodium use in heart healthy nutrition. Review and explanation with models, verbal and written materials for utilization of the food label.   Exercise Physiology & Risk Factors: - Group verbal and written instruction with models to review the exercise  physiology of the cardiovascular system and associated critical values. Details cardiovascular disease risk factors and the goals associated with each risk factor.   Aerobic Exercise & Resistance Training: - Gives group verbal and written discussion on the health impact of inactivity. On the components of aerobic and resistive training programs and the benefits of this training and how to safely progress through these programs.   Flexibility, Balance, General Exercise Guidelines: - Provides group verbal and written instruction on the benefits of flexibility and balance training programs. Provides general exercise guidelines with specific guidelines to those with heart or lung disease. Demonstration and skill practice provided.   Stress Management: - Provides group verbal and written instruction about the health risks of elevated stress, cause of high stress, and healthy ways to reduce stress.   Depression: - Provides group verbal and written instruction on the correlation between heart/lung disease and depressed mood, treatment options, and the stigmas associated with seeking treatment.   Exercise & Equipment Safety: - Individual verbal instruction and demonstration of equipment use and safety with use of the equipment. Flowsheet Row Pulmonary Rehab from Montgomery in [facility] Card[facility] and Pulmonary  Rehab  Date  03/20/16  Educator  C. EnterkinRN  Instruction Review Code  1- partially meets, needs review/practice      Infection Prevention: - Provides verbal and written material to individual with discussion of infection control including proper hand washing and proper equipment cleaning during exercise session. Flowsheet Row Pulmonary Rehab from Montgomery in [facility] Cardiac and Pulmonary Rehab  Date  03/20/16  Educator  C. [place]  Instruction Review Code  1- partially meets, needs review/practice      Falls Prevention: - Provides verbal and written material to individual with discussion  of falls prevention and safety. Flowsheet Row Pulmonary Rehab from Montgomery in [facility] Cardiac and Pulmonary Rehab  Date  03/20/16  Educator  C. Nargis Abrams Therapist, sports  Instruction Review Code  1- partially meets, needs review/practice      Diabetes: - Individual verbal and written instruction to review signs/symptoms of diabetes, desired ranges of glucose level fasting, after meals and with exercise. Advice that pre and post exercise glucose checks will be done for 3 sessions at entry of program.   Chronic Lung Diseases: - Group verbal and written instruction to review new updates, new respiratory medications, new advancements in procedures and treatments. Provide informative websites and "800" numbers of self-education.   Lung Procedures: - Group verbal and written instruction to describe testing methods done to diagnose lung disease. Review the outcome of test results. Describe the treatment choices: Pulmonary Function Tests, ABGs and oximetry.   Energy Conservation: - Provide group verbal and written instruction for methods to conserve energy, plan and organize activities. Instruct on pacing techniques, use of adaptive equipment and posture/positioning to relieve shortness of breath.   Triggers: - Group verbal and written instruction to review types of environmental controls: home humidity, furnaces, filters, dust mite/pet prevention, HEPA vacuums. To discuss weather changes, air quality and the benefits of nasal washing.   Exacerbations: - Group verbal and written instruction to provide: warning signs, infection symptoms, calling MD promptly, preventive modes, and value of vaccinations. Review: effective airway clearance, coughing and/or vibration techniques. Create an Sports administrator.   Oxygen: - Individual and group verbal and written instruction on oxygen therapy. Includes supplement oxygen, available portable oxygen systems, continuous and intermittent flow rates, oxygen safety,  concentrators, and Medicare reimbursement for oxygen.   Respiratory Medications: - Group verbal and written instruction to review medications for lung disease. Drug class, frequency, complications, importance of spacers, rinsing mouth after steroid MDI's, and proper cleaning methods for nebulizers.   AED/CPR: - Group verbal and written instruction with the use of models to demonstrate the basic use of the AED with the basic ABC's of resuscitation.   Breathing Retraining: - Provides individuals verbal and written instruction on purpose, frequency, and proper technique of diaphragmatic breathing and pursed-lipped breathing. Applies individual practice skills. Flowsheet Row Pulmonary Rehab from Montgomery in [facility] Cardiac and Pulmonary Rehab  Date  03/20/16  Educator  C. EnterkinRN  Instruction Review Code  2- meets Designer, fashion/clothing and Physiology of the Lungs: - Group verbal and written instruction with the use of models to provide basic lung anatomy and physiology related to function, structure and complications of lung disease.   Heart Failure: - Group verbal and written instruction on the basics of heart failure: signs/symptoms, treatments, explanation of ejection fraction, enlarged heart and cardiomyopathy.   Sleep Apnea: - Individual verbal and written instruction to review Obstructive Sleep Apnea. Review of risk factors, methods for diagnosing and types of masks  and machines for OSA.   Anxiety: - Provides group, verbal and written instruction on the correlation between heart/lung disease and anxiety, treatment options, and management of anxiety.   Relaxation: - Provides group, verbal and written instruction about the benefits of relaxation for patients with heart/lung disease. Also provides patients with examples of relaxation techniques.   Knowledge Questionnaire Score:    Core Components/Risk Factors/Patient Goals at Admission:     Personal Goals and Risk  Factors at Admission - 03/20/16 1254      Core Components/Risk Factors/Patient Goals on Admission    Weight Management Obesity;Weight Loss   Sedentary Yes   Intervention Provide advice, education, support and counseling about physical activity/exercise needs.;Develop an individualized exercise prescription for aerobic and resistive training based on initial evaluation findings, risk stratification, comorbidities and participant's personal goals.   Expected Outcomes Achievement of increased cardiorespiratory fitness and enhanced flexibility, muscular endurance and strength shown through measurements of functional capacity and personal statement of participant.   Increase Strength and Stamina Yes   Intervention Provide advice, education, support and counseling about physical activity/exercise needs.;Develop an individualized exercise prescription for aerobic and resistive training based on initial evaluation findings, risk stratification, comorbidities and participant's personal goals.   Expected Outcomes Achievement of increased cardiorespiratory fitness and enhanced flexibility, muscular endurance and strength shown through measurements of functional capacity and personal statement of participant.   Improve shortness of breath with ADL's Yes   Intervention Provide education, individualized exercise plan and daily activity instruction to help decrease symptoms of SOB with activities of daily living.   Expected Outcomes Short Term: Achieves a reduction of symptoms when performing activities of daily living.      Core Components/Risk Factors/Patient Goals Review:    Core Components/Risk Factors/Patient Goals at Discharge (Final Review):    ITP Comments:     ITP Comments    Row Name 03/20/16 1251           ITP Comments Kristen Montgomery reports that she had tried to exercise in the [facility] but her father coded upstairs in the hospital and died. Kristen Montgomery said she had not been back to exercise since  then. Kristen Montgomery said that she knows she needs a hip surgery but she is not sure when the doctor can do it. We discussed that Pulm Rehab can get her strength up for her upcoming hip surgery.           Comments: Kristen Montgomery is ready to start Pulmonary Rehab.

## 2016-03-20 NOTE — Progress Notes (Signed)
Pulmonary Individual Treatment Plan  Patient Details  Name: Kristen Montgomery MRN: [id number] Date of Birth: [birth year] Referring Provider:   Flowsheet Row Pulmonary Rehab from 03/20/2016 in [facility] Cardiac and Pulmonary Rehab  Referring Provider  [name]      Initial Encounter Date:  Flowsheet Row Pulmonary Rehab from 03/20/2016 in [facility] Cardiac and Pulmonary Rehab  Date  03/20/16  Referring Provider  [name]      Visit Diagnosis: COPD, severe ([place])  Patient's Home Medications on Admission:  Current Outpatient Prescriptions:    albuterol (VENTOLIN HFA) 108 (90 Base) MCG/ACT inhaler, INHALE 2 PUFFS BY MOUTH EVERY 4 TO 6 HOURS AS NEEDED FOR SHORTNESS OF BREATH, COUGH OR WHEEZE, Disp: , Rfl:    docusate sodium (COLACE) 100 MG capsule, Take 100 mg by mouth., Disp: , Rfl:    furosemide (LASIX) 40 MG tablet, Take 40 mg by mouth., Disp: , Rfl:    tiotropium (SPIRIVA HANDIHALER) 18 MCG inhalation capsule, INHALE 1 CAPSULE VIA HANDIHALER ONCE DAILY AT THE SAME TIME EVERY DAY, Disp: , Rfl:    traZODone (DESYREL) 50 MG tablet, TAKE 1 TABLET BY MOUTH AT BEDTIME AS NEEDED, Disp: , Rfl:    Treprostinil (TYVASO) 0.6 MG/ML SOLN, Inhale 3-9 breaths four times daily., Disp: , Rfl:    albuterol (ACCUNEB) 0.63 MG/3ML nebulizer solution, Inhale 1 ampule by nebulization every six (6) hours as needed for wheezing., Disp: , Rfl:    aspirin EC 81 MG tablet, Take by mouth., Disp: , Rfl:    carvedilol (COREG) 3.125 MG tablet, Take 3.125 mg by mouth., Disp: , Rfl:    losartan (COZAAR) 50 MG tablet, Take 50 mg by mouth 1 day or 1 dose., Disp: , Rfl:    omeprazole (PRILOSEC) 20 MG capsule, Take 20 mg by mouth., Disp: , Rfl:    potassium chloride SA (K-DUR,KLOR-CON) 20 MEQ tablet, Take 10 mEq by mouth., Disp: , Rfl:   Past Medical History: No past medical history on file.  Tobacco Use: History  Smoking Status   Not on file  Smokeless Tobacco   Not on file    Labs: Recent Review Flowsheet Data     There is no flowsheet data to display.       ADL UCSD:     Pulmonary Assessment Scores    Row Name 03/20/16 1435         ADL UCSD   ADL Phase Entry     SOB Score total 68     Rest 0     Walk 2     Stairs 4     Bath 2     Dress 3     Shop 3        Pulmonary Function Assessment:     Pulmonary Function Assessment - 03/20/16 1433      Initial Spirometry Results   FVC% 31 %   FEV1% 27 %   FEV1/FVC Ratio 68.6   Comments Test date 03/20/2016      Post Bronchodilator Spirometry Results   FVC% 46 %   FEV1% 29 %   FEV1/FVC Ratio 62.47     Breath   Bilateral Breath Sounds Decreased   Shortness of Breath Yes;Fear of Shortness of Breath;Limiting activity      Exercise Target Goals: Date: 03/20/16  Exercise Program Goal: Individual exercise prescription set with THRR, safety & activity barriers. Participant demonstrates ability to understand and report RPE using BORG scale, to self-measure pulse accurately, and to acknowledge the importance of the  exercise prescription.  Exercise Prescription Goal: Starting with aerobic activity 30 plus minutes a day, 3 days per week for initial exercise prescription. Provide home exercise prescription and guidelines that participant acknowledges understanding prior to discharge.  Activity Barriers & Risk Stratification:     Activity Barriers & Cardiac Risk Stratification - 03/20/16 1432      Activity Barriers & Cardiac Risk Stratification   Activity Barriers Shortness of Breath;Deconditioning   Cardiac Risk Stratification Moderate      6 Minute Walk:     6 Minute Walk    Row Name 03/20/16 1332         6 Minute Walk   Distance 340 feet     Walk Time 3 minutes     # of Rest Breaks 3     MPH 1.28     METS 1.17     RPE 13     Perceived Dyspnea  4     VO2 Peak 4.11     Symptoms No     Resting HR 96 bpm     Resting BP 128/60     Max Ex. HR 116 bpm     Max Ex. BP 142/62     2 Minute Post BP 104/64       Interval HR    Baseline HR 96     1 Minute HR 113     2 Minute HR 117     3 Minute HR 114     5 Minute HR 116     2 Minute Post HR 100     Interval Heart Rate? Yes       Interval Oxygen   Interval Oxygen? Yes     Baseline Oxygen Saturation % 93 %     Baseline Liters of Oxygen 4 L     1 Minute Oxygen Saturation % 93 %     1 Minute Liters of Oxygen 4 L     2 Minute Oxygen Saturation % 88 %     2 Minute Liters of Oxygen 4 L     3 Minute Oxygen Saturation % 93 %     3 Minute Liters of Oxygen 4 L     4 Minute Liters of Oxygen 4 L     5 Minute Oxygen Saturation % 86 %     5 Minute Liters of Oxygen 4 L     6 Minute Liters of Oxygen 4 L     2 Minute Post Oxygen Saturation % 100 %     2 Minute Post Liters of Oxygen 4 L        Initial Exercise Prescription:     Initial Exercise Prescription - 03/20/16 1300      Date of Initial Exercise RX and Referring Provider   Date 03/20/16   Referring Provider [name]     Oxygen   Oxygen Continuous   Liters 4     Treadmill   MPH 0.5   Grade 0   Minutes 15  2 then rest and repeat     NuStep   Level 1   Minutes 15  2 then rest and repeat   METs 1     Arm Ergometer   Level 1   Minutes 15  2 min then rest and repeat     T5 Nustep   Level 1   Minutes 15  2 min then rest and repeat   METs 1     Prescription Details   Frequency (  times per week) 3   Duration Progress to 30 minutes of continuous aerobic without signs/symptoms of physical distress     Intensity   THRR 40-80% of Max Heartrate 120-144   Ratings of Perceived Exertion 11-13   Perceived Dyspnea 0-4     Progression   Progression Continue to progress workloads to maintain intensity without signs/symptoms of physical distress.     Resistance Training   Training Prescription Yes   Weight 1   Reps 10-15      Perform Capillary Blood Glucose checks as needed.  Exercise Prescription Changes:   Exercise Comments:     Exercise Comments    Row Name 03/20/16 1344            Exercise Comments Mayela uses a wheel chair - she did not seem to have trouble with balance other than being very deconditioned and not being able to walk very far.          Discharge Exercise Prescription (Final Exercise Prescription Changes):    Nutrition:  Target Goals: Understanding of nutrition guidelines, daily intake of sodium <1546m, cholesterol <20105m calories 30% from fat and 7% or less from saturated fats, daily to have 5 or more servings of fruits and vegetables.  Biometrics:     Pre Biometrics - 03/20/16 1331      Pre Biometrics   Height _0  (1.499 m)   Weight 188 lb 6.4 oz (85.5 kg)   Waist Circumference 44.5 inches   Hip Circumference 51.13 inches   Waist to Hip Ratio 0.87 %   BMI (Calculated) 38.1       Nutrition Therapy Plan and Nutrition Goals:   Nutrition Discharge: Rate Your Plate Scores:   Psychosocial: Target Goals: Acknowledge presence or absence of depression, maximize coping skills, provide positive support system. Participant is able to verbalize types and ability to use techniques and skills needed for reducing stress and depression.  Initial Review & Psychosocial Screening:     Initial Psych Review & Screening - 03/20/16 1253      Initial Review   Current issues with Current Stress Concerns   Comments JeJamiaeports that she had tried to exercise in the Independent gym but her father coded upstairs in the hospital and died. JeNiyonnaaid she had not been back to exercise since then. JePaysonaid that she knows she needs a hip surgery but she is not sure when the doctor can do it. We discussed that Pulm Rehab can get her strength up for her upcoming hip surgery.      Family Dynamics   Good Support System? Yes   Concerns Recent loss of significant other   Comments Her father died since she has attended Pulm REhab last     Barriers   Psychosocial barriers to participate in program The patient should benefit from training in stress management  and relaxation.     Screening Interventions   Interventions Encouraged to exercise      Quality of Life Scores:     Quality of Life - 03/20/16 1439      Quality of Life Scores   Health/Function Pre 20.93 %   Socioeconomic Pre 21 %   Psych/Spiritual Pre 21 %   Family Pre 21 %   GLOBAL Pre 20.97 %      PHQ-9: Recent Review Flowsheet Data    [facility]   Decreased Interest 0   Down, Depressed, Hopeless 0   PHQ - 2 Score 0  Altered sleeping 0   Tired, decreased energy 0   Change in appetite 0   Feeling bad or failure about yourself  0   Trouble concentrating 0   Moving slowly or fidgety/restless 0   Suicidal thoughts 0   PHQ-9 Score 0      Psychosocial Evaluation and Intervention:   Psychosocial Re-Evaluation:  Education: Education Goals: Education classes will be provided on a weekly basis, covering required topics. Participant will state understanding/return demonstration of topics presented.  Learning Barriers/Preferences:     Learning Barriers/Preferences - 03/20/16 1432      Learning Barriers/Preferences   Learning Barriers None   Learning Preferences Group Instruction;Individual Instruction;Pictoral;Skilled Demonstration;Verbal Instruction;Video;Written Material      Education Topics: Initial Evaluation Education: - Verbal, written and demonstration of respiratory meds, RPE/PD scales, oximetry and breathing techniques. Instruction on use of nebulizers and MDIs: cleaning and proper use, rinsing mouth with steroid doses and importance of monitoring MDI activations.   General Nutrition Guidelines/Fats and Fiber: -Group instruction provided by verbal, written material, models and posters to present the general guidelines for heart healthy nutrition. Gives an explanation and review of dietary fats and fiber.   Controlling Sodium/Reading Food Labels: -Group verbal and written material supporting the discussion of sodium use in  heart healthy nutrition. Review and explanation with models, verbal and written materials for utilization of the food label.   Exercise Physiology & Risk Factors: - Group verbal and written instruction with models to review the exercise physiology of the cardiovascular system and associated critical values. Details cardiovascular disease risk factors and the goals associated with each risk factor.   Aerobic Exercise & Resistance Training: - Gives group verbal and written discussion on the health impact of inactivity. On the components of aerobic and resistive training programs and the benefits of this training and how to safely progress through these programs.   Flexibility, Balance, General Exercise Guidelines: - Provides group verbal and written instruction on the benefits of flexibility and balance training programs. Provides general exercise guidelines with specific guidelines to those with heart or lung disease. Demonstration and skill practice provided.   Stress Management: - Provides group verbal and written instruction about the health risks of elevated stress, cause of high stress, and healthy ways to reduce stress.   Depression: - Provides group verbal and written instruction on the correlation between heart/lung disease and depressed mood, treatment options, and the stigmas associated with seeking treatment.   Exercise & Equipment Safety: - Individual verbal instruction and demonstration of equipment use and safety with use of the equipment. Flowsheet Row Pulmonary Rehab from 03/20/2016 in [facility] Cardiac and Pulmonary Rehab  Date  03/20/16  Educator  C. EnterkinRN  Instruction Review Code  1- partially meets, needs review/practice      Infection Prevention: - Provides verbal and written material to individual with discussion of infection control including proper hand washing and proper equipment cleaning during exercise session. Flowsheet Row Pulmonary Rehab from 03/20/2016 in  [facility] Cardiac and Pulmonary Rehab  Date  03/20/16  Educator  C. Rothbury  Instruction Review Code  1- partially meets, needs review/practice      Falls Prevention: - Provides verbal and written material to individual with discussion of falls prevention and safety. Flowsheet Row Pulmonary Rehab from 03/20/2016 in [facility] Cardiac and Pulmonary Rehab  Date  03/20/16  Educator  C. Enterkin RN  Instruction Review Code  1- partially meets, needs review/practice      Diabetes: - Individual verbal and written  instruction to review signs/symptoms of diabetes, desired ranges of glucose level fasting, after meals and with exercise. Advice that pre and post exercise glucose checks will be done for 3 sessions at entry of program.   Chronic Lung Diseases: - Group verbal and written instruction to review new updates, new respiratory medications, new advancements in procedures and treatments. Provide informative websites and "800" numbers of self-education.   Lung Procedures: - Group verbal and written instruction to describe testing methods done to diagnose lung disease. Review the outcome of test results. Describe the treatment choices: Pulmonary Function Tests, ABGs and oximetry.   Energy Conservation: - Provide group verbal and written instruction for methods to conserve energy, plan and organize activities. Instruct on pacing techniques, use of adaptive equipment and posture/positioning to relieve shortness of breath.   Triggers: - Group verbal and written instruction to review types of environmental controls: home humidity, furnaces, filters, dust mite/pet prevention, HEPA vacuums. To discuss weather changes, air quality and the benefits of nasal washing.   Exacerbations: - Group verbal and written instruction to provide: warning signs, infection symptoms, calling MD promptly, preventive modes, and value of vaccinations. Review: effective airway clearance, coughing and/or vibration techniques.  Create an Sports administrator.   Oxygen: - Individual and group verbal and written instruction on oxygen therapy. Includes supplement oxygen, available portable oxygen systems, continuous and intermittent flow rates, oxygen safety, concentrators, and Medicare reimbursement for oxygen.   Respiratory Medications: - Group verbal and written instruction to review medications for lung disease. Drug class, frequency, complications, importance of spacers, rinsing mouth after steroid MDI's, and proper cleaning methods for nebulizers.   AED/CPR: - Group verbal and written instruction with the use of models to demonstrate the basic use of the AED with the basic ABC's of resuscitation.   Breathing Retraining: - Provides individuals verbal and written instruction on purpose, frequency, and proper technique of diaphragmatic breathing and pursed-lipped breathing. Applies individual practice skills. Flowsheet Row Pulmonary Rehab from 03/20/2016 in [facility] Cardiac and Pulmonary Rehab  Date  03/20/16  Educator  C. EnterkinRN  Instruction Review Code  2- meets Designer, fashion/clothing and Physiology of the Lungs: - Group verbal and written instruction with the use of models to provide basic lung anatomy and physiology related to function, structure and complications of lung disease.   Heart Failure: - Group verbal and written instruction on the basics of heart failure: signs/symptoms, treatments, explanation of ejection fraction, enlarged heart and cardiomyopathy.   Sleep Apnea: - Individual verbal and written instruction to review Obstructive Sleep Apnea. Review of risk factors, methods for diagnosing and types of masks and machines for OSA.   Anxiety: - Provides group, verbal and written instruction on the correlation between heart/lung disease and anxiety, treatment options, and management of anxiety.   Relaxation: - Provides group, verbal and written instruction about the benefits of relaxation for  patients with heart/lung disease. Also provides patients with examples of relaxation techniques.   Knowledge Questionnaire Score:     Knowledge Questionnaire Score - 03/20/16 1432      Knowledge Questionnaire Score   Pre Score 9/10       Core Components/Risk Factors/Patient Goals at Admission:     Personal Goals and Risk Factors at Admission - 03/20/16 1438      Core Components/Risk Factors/Patient Goals on Admission   Develop more efficient breathing techniques such as purse lipped breathing and diaphragmatic breathing; and practicing self-pacing with activity Yes   Intervention Provide education,  demonstration and support about specific breathing techniuqes utilized for more efficient breathing. Include techniques such as pursed lipped breathing, diaphragmatic breathing and self-pacing activity.   Expected Outcomes Short Term: Participant will be able to demonstrate and use breathing techniques as needed throughout daily activities.   Increase knowledge of respiratory medications and ability to use respiratory devices properly  Yes   Intervention Provide education and demonstration as needed of appropriate use of medications, inhalers, and oxygen therapy.   Expected Outcomes Short Term: Achieves understanding of medications use. Understands that oxygen is a medication prescribed by physician. Demonstrates appropriate use of inhaler and oxygen therapy.   Hypertension Yes   Intervention Provide education on lifestyle modifcations including regular physical activity/exercise, weight management, moderate sodium restriction and increased consumption of fresh fruit, vegetables, and low fat dairy, alcohol moderation, and smoking cessation.;Monitor prescription use compliance.   Expected Outcomes Short Term: Continued assessment and intervention until BP is < 140/105m HG in hypertensive participants. < 130/857mHG in hypertensive participants with diabetes, heart failure or chronic kidney  disease.;Long Term: Maintenance of blood pressure at goal levels.      Core Components/Risk Factors/Patient Goals Review:    Core Components/Risk Factors/Patient Goals at Discharge (Final Review):    ITP Comments:     ITP Comments    Row Name 03/20/16 1251           ITP Comments JeDatraeports that she had tried to exercise in the [facility] gym but her father coded upstairs in the hospital and died. JeLyzetteaid she had not been back to exercise since then. JePearlaid that she knows she needs a hip surgery but she is not sure when the doctor can do it. We discussed that Pulm Rehab can get her strength up for her upcoming hip surgery.           Comments: Ms ShDembylans to start LuOsgoodn 03/26/2016 and attend 3 days/week.

## 2016-03-20 NOTE — Patient Instructions (Signed)
Patient Instructions  Patient Details  Name: Kristen Montgomery MRN: [id number] Date of Birth: January 01, 1951 Referring Provider:  French Ana, MD  Below are the personal goals you chose as well as exercise and nutrition goals. Our goal is to help you keep on track towards obtaining and maintaining your goals. We will be discussing your progress on these goals with you throughout the program.  Initial Exercise Prescription:     Initial Exercise Prescription - 03/20/16 1300      Date of Initial Exercise RX and Referring Provider   Date 03/20/16   Referring Provider [name]     Oxygen   Oxygen Continuous   Liters 4     Treadmill   MPH 0.5   Grade 0   Minutes 15  2 then rest and repeat     NuStep   Level 1   Minutes 15  2 then rest and repeat   METs 1     Arm Ergometer   Level 1   Minutes 15  2 min then rest and repeat     T5 Nustep   Level 1   Minutes 15  2 min then rest and repeat   METs 1     Prescription Details   Frequency (times per week) 3   Duration Progress to 30 minutes of continuous aerobic without signs/symptoms of physical distress     Intensity   THRR 40-80% of Max Heartrate 120-144   Ratings of Perceived Exertion 11-13   Perceived Dyspnea 0-4     Progression   Progression Continue to progress workloads to maintain intensity without signs/symptoms of physical distress.     Resistance Training   Training Prescription Yes   Weight 1   Reps 10-15      Exercise Goals: Frequency: Be able to perform aerobic exercise three times per week working toward 3-5 days per week.  Intensity: Work with a perceived exertion of 11 (fairly light) - 15 (hard) as tolerated. Follow your new exercise prescription and watch for changes in prescription as you progress with the program. Changes will be reviewed with you when they are made.  Duration: You should be able to do 30 minutes of continuous aerobic exercise in addition to a 5 minute warm-up and a 5 minute  cool-down routine.  Nutrition Goals: Your personal nutrition goals will be established when you do your nutrition analysis with the dietician.  The following are nutrition guidelines to follow: Cholesterol < 290m/day Sodium < 15056mday Fiber: Women over 50 yrs - 21 grams per day  Personal Goals:     Personal Goals and Risk Factors at Admission - 03/20/16 1438      Core Components/Risk Factors/Patient Goals on Admission   Develop more efficient breathing techniques such as purse lipped breathing and diaphragmatic breathing; and practicing self-pacing with activity Yes   Intervention Provide education, demonstration and support about specific breathing techniuqes utilized for more efficient breathing. Include techniques such as pursed lipped breathing, diaphragmatic breathing and self-pacing activity.   Expected Outcomes Short Term: Participant will be able to demonstrate and use breathing techniques as needed throughout daily activities.   Increase knowledge of respiratory medications and ability to use respiratory devices properly  Yes   Intervention Provide education and demonstration as needed of appropriate use of medications, inhalers, and oxygen therapy.   Expected Outcomes Short Term: Achieves understanding of medications use. Understands that oxygen is a medication prescribed by physician. Demonstrates appropriate use of inhaler and oxygen therapy.  Hypertension Yes   Intervention Provide education on lifestyle modifcations including regular physical activity/exercise, weight management, moderate sodium restriction and increased consumption of fresh fruit, vegetables, and low fat dairy, alcohol moderation, and smoking cessation.;Monitor prescription use compliance.   Expected Outcomes Short Term: Continued assessment and intervention until BP is < 140/64m HG in hypertensive participants. < 130/857mHG in hypertensive participants with diabetes, heart failure or chronic kidney  disease.;Long Term: Maintenance of blood pressure at goal levels.      Tobacco Use Initial Evaluation: History  Smoking Status   Not on file  Smokeless Tobacco   Not on file    Copy of goals given to participant.

## 2016-03-22 DIAGNOSIS — J449 Chronic obstructive pulmonary disease, unspecified: Secondary | ICD-10-CM | POA: Diagnosis not present

## 2016-03-26 DIAGNOSIS — J449 Chronic obstructive pulmonary disease, unspecified: Secondary | ICD-10-CM

## 2016-03-26 DIAGNOSIS — I272 Other secondary pulmonary hypertension: Secondary | ICD-10-CM | POA: Diagnosis not present

## 2016-03-26 NOTE — Progress Notes (Signed)
Daily Session Note  Patient Details  Name: Kristen Montgomery MRN: [id number] Date of Birth: 1950-07-28 Referring Provider:   Flowsheet Row Pulmonary Rehab from 03/20/2016 in [facility] Cardiac and Pulmonary Rehab  Referring Provider  [name]      Encounter Date: 03/26/2016  Check In:     Session Check In - 03/26/16 1322      Check-In   Location [facility]-Cardiac & Pulmonary Rehab   Staff Present Earlean Shawl, BS, ACSM CEP, Exercise Physiologist;[place] Hanlon Oletta Darter, BA, ACSM CEP, Exercise Physiologist;Laureen Janell Quiet, RRT, Respiratory Therapist   Supervising physician immediately available to respond to emergencies [facility] immediately available ER MD   Physician(s) Jenelle Mages   Medication changes reported     No   Fall or balance concerns reported    No   Warm-up and Cool-down Performed as group-led instruction   Resistance Training Performed Yes   VAD Patient? No     Pain Assessment   Currently in Pain? No/denies   Multiple Pain Sites No         Goals Met:  Strength training completed today  Goals Unmet:  Not Applicable  Comments: First full day of exercise!  Patient was oriented to gym and equipment including functions, settings, policies, and procedures.  Patient's individual exercise prescription and treatment plan were reviewed.  All starting workloads were established based on the results of the 6 minute walk test done at initial orientation visit.  The plan for exercise progression was also introduced and progression will be customized based on patient's performance and goals.    Dr. Emily Filbert is Medical Director for [facility] and [facility] Pulmonary Rehabilitation.

## 2016-03-28 ENCOUNTER — Encounter: Payer: Medicare Other | Admitting: *Deleted

## 2016-03-28 DIAGNOSIS — J449 Chronic obstructive pulmonary disease, unspecified: Secondary | ICD-10-CM

## 2016-03-28 NOTE — Progress Notes (Signed)
Daily Session Note  Patient Details  Name: Kristen Montgomery MRN: [id number] Date of Birth: [birth year] Referring Provider:   Flowsheet Row Pulmonary Rehab from 03/20/2016 in [facility] Cardiac and Pulmonary Rehab  Referring Provider  [name]      Encounter Date: 03/28/2016  Check In:     Session Check In - 03/28/16 1145      Check-In   Location [facility]-Cardiac & Pulmonary Rehab   Staff Present Carson Myrtle, BS, RRT, Respiratory Therapist;Jaqwon Manfred, RN, Levie Heritage, MA, ACSM RCEP, Exercise Physiologist   Supervising physician immediately available to respond to emergencies [facility] immediately available ER MD   Physician(s) Dr. Ciro Backer and Dr. Edd Fabian   Medication changes reported     No   Fall or balance concerns reported    No   Warm-up and Cool-down Performed on first and last piece of equipment   Resistance Training Performed Yes         Goals Met:  Proper associated with RPD/PD & O2 Sat Exercise tolerated well  Goals Unmet:  Not Applicable  Comments:     Dr. Emily Filbert is Medical Director for [place] and [facility].

## 2016-03-30 ENCOUNTER — Encounter: Payer: Medicare Other | Admitting: Respiratory Therapy

## 2016-03-30 DIAGNOSIS — I272 Other secondary pulmonary hypertension: Secondary | ICD-10-CM | POA: Diagnosis not present

## 2016-03-30 DIAGNOSIS — I2721 Secondary pulmonary arterial hypertension: Secondary | ICD-10-CM

## 2016-03-30 DIAGNOSIS — J449 Chronic obstructive pulmonary disease, unspecified: Secondary | ICD-10-CM | POA: Diagnosis not present

## 2016-03-30 NOTE — Progress Notes (Signed)
Daily Session Note  Patient Details  Name: Kristen Montgomery MRN: [id number] Date of Birth: 31-May-1951 Referring Provider:   Flowsheet Row Pulmonary Rehab from 03/20/2016 in [facility] Cardiac and Pulmonary Rehab  Referring Provider  [name]      Encounter Date: 03/30/2016  Check In:     Session Check In - 03/30/16 1215      Check-In   Location [facility]-Cardiac & Pulmonary Rehab   Staff Present Heath Lark, RN, BSN, CCRP;Marce Schartz Blanch Media, RRT, RCP, Respiratory Therapist;Carroll Enterkin, RN, BSN   Supervising physician immediately available to respond to emergencies [facility] immediately available ER MD   Physician(s) Dr. Quentin Cornwall & McShane   Medication changes reported     No   Warm-up and Cool-down Performed on first and last piece of equipment   Resistance Training Performed Yes   VAD Patient? No     Pain Assessment   Currently in Pain? No/denies         Goals Met:  Proper associated with RPD/PD & O2 Sat Independence with exercise equipment Using PLB without cueing & demonstrates good technique Exercise tolerated well Strength training completed today  Goals Unmet:  Not Applicable  Comments: Pt able to follow exercise prescription today without complaint.  Will continue to monitor for progression.   Dr. Emily Filbert is Medical Director for [place] and [facility].

## 2016-04-02 DIAGNOSIS — J449 Chronic obstructive pulmonary disease, unspecified: Secondary | ICD-10-CM

## 2016-04-02 DIAGNOSIS — I272 Other secondary pulmonary hypertension: Secondary | ICD-10-CM | POA: Diagnosis not present

## 2016-04-02 DIAGNOSIS — I2721 Secondary pulmonary arterial hypertension: Secondary | ICD-10-CM

## 2016-04-02 NOTE — Progress Notes (Signed)
Daily Session Note  Patient Details  Name: Kristen Montgomery MRN: [id number] Date of Birth: 1950-12-13 Referring Provider:   Flowsheet Row Pulmonary Rehab from 03/20/2016 in [facility] Cardiac and Pulmonary Rehab  Referring Provider  [name]      Encounter Date: 04/02/2016  Check In:     Session Check In - 04/02/16 1527      Check-In   Location [facility]-Cardiac & Pulmonary Rehab   Staff Present Carson Myrtle, BS, RRT, Respiratory Therapist;Kelly Amedeo Plenty, BS, ACSM CEP, Exercise Physiologist;Elyza Whitt Oletta Darter, BA, ACSM CEP, Exercise Physiologist   Supervising physician immediately available to respond to emergencies [facility] immediately available ER MD   Physician(s) Elenore Rota   Medication changes reported     No   Fall or balance concerns reported    No   Warm-up and Cool-down Performed as group-led instruction   Resistance Training Performed Yes   VAD Patient? No     Pain Assessment   Currently in Pain? No/denies         Goals Met:  Proper associated with RPD/PD & O2 Sat Independence with exercise equipment Exercise tolerated well Strength training completed today  Goals Unmet:  Not Applicable  Comments: Pt able to follow exercise prescription today without complaint.  Will continue to monitor for progression.    Dr. Emily Filbert is Medical Director for [place] and [facility].

## 2016-04-04 ENCOUNTER — Encounter: Payer: Medicare Other | Admitting: *Deleted

## 2016-04-04 DIAGNOSIS — J449 Chronic obstructive pulmonary disease, unspecified: Secondary | ICD-10-CM | POA: Diagnosis not present

## 2016-04-04 DIAGNOSIS — I272 Other secondary pulmonary hypertension: Secondary | ICD-10-CM | POA: Diagnosis not present

## 2016-04-04 DIAGNOSIS — I2721 Secondary pulmonary arterial hypertension: Secondary | ICD-10-CM

## 2016-04-04 NOTE — Progress Notes (Signed)
Daily Session Note  Patient Details  Name: Kristen Montgomery MRN: [id number] Date of Birth: May 22, 1951 Referring Provider:   Flowsheet Row Pulmonary Rehab from 03/20/2016 in [facility] Cardiac and Pulmonary Rehab  Referring Provider  [name]      Encounter Date: 04/04/2016  Check In:     Session Check In - 04/04/16 1238      Check-In   Location [facility]-Cardiac & Pulmonary Rehab   Staff Present Nyoka Cowden, RN, BSN, MA;Carroll Enterkin, RN, BSN;Jessica Luan Pulling, MA, ACSM RCEP, Exercise Physiologist         Goals Met:  Proper associated with RPD/PD & O2 Sat Independence with exercise equipment Exercise tolerated well Strength training completed today  Goals Unmet: Not appplicable   Comments: Pt able to follow exercise prescription today without complaint.  Will continue to monitor for progression.    Dr. Emily Filbert is Medical Director for [place] and [facility].

## 2016-04-06 ENCOUNTER — Encounter: Payer: Medicare Other | Admitting: Respiratory Therapy

## 2016-04-06 DIAGNOSIS — J449 Chronic obstructive pulmonary disease, unspecified: Secondary | ICD-10-CM | POA: Diagnosis not present

## 2016-04-06 DIAGNOSIS — I272 Other secondary pulmonary hypertension: Secondary | ICD-10-CM | POA: Diagnosis not present

## 2016-04-06 DIAGNOSIS — I2721 Secondary pulmonary arterial hypertension: Secondary | ICD-10-CM

## 2016-04-06 NOTE — Progress Notes (Signed)
Daily Session Note  Patient Details  Name: Kristen Montgomery MRN: [id number] Date of Birth: April 07, 1951 Referring Provider:   Flowsheet Row Pulmonary Rehab from 03/20/2016 in [facility] Cardiac and Pulmonary Rehab  Referring Provider  [name]      Encounter Date: 04/06/2016  Check In:     Session Check In - 04/06/16 1414      Check-In   Location [facility]-Cardiac & Pulmonary Rehab   Staff Present Alberteen Sam, MA, ACSM RCEP, Exercise Physiologist;Kassy Mcenroe Blanch Media, RRT, RCP, Respiratory Therapist;Carroll Enterkin, RN, BSN   Supervising physician immediately available to respond to emergencies [facility] immediately available ER MD   Physician(s) Dr. Jimmye Norman and Darl Householder   Medication changes reported     No   Fall or balance concerns reported    No   Warm-up and Cool-down Performed on first and last piece of equipment   Resistance Training Performed Yes   VAD Patient? No     Pain Assessment   Currently in Pain? No/denies         Goals Met:  Proper associated with RPD/PD & O2 Sat Independence with exercise equipment Using PLB without cueing & demonstrates good technique Exercise tolerated well Strength training completed today  Goals Unmet:  Not Applicable  Comments: Pt able to follow exercise prescription today without complaint.  Will continue to monitor for progression.    Dr. Emily Filbert is Medical Director for [place] and [facility].

## 2016-04-09 DIAGNOSIS — I272 Other secondary pulmonary hypertension: Secondary | ICD-10-CM | POA: Diagnosis not present

## 2016-04-09 DIAGNOSIS — J449 Chronic obstructive pulmonary disease, unspecified: Secondary | ICD-10-CM | POA: Diagnosis not present

## 2016-04-09 DIAGNOSIS — I2721 Secondary pulmonary arterial hypertension: Secondary | ICD-10-CM

## 2016-04-09 NOTE — Progress Notes (Signed)
Daily Session Note  Patient Details  Name: Kristen Montgomery MRN: [id number] Date of Birth: Mar 12, 1951 Referring Provider:   Flowsheet Row Pulmonary Rehab from 03/20/2016 in [facility] Cardiac and Pulmonary Rehab  Referring Provider  [name]      Encounter Date: 04/09/2016  Check In:     Session Check In - 04/09/16 1459      Check-In   Location [facility]-Cardiac & Pulmonary Rehab   Staff Present Nada Maclachlan, BA, ACSM CEP, Exercise Physiologist;Laureen Owens Shark, BS, RRT, Respiratory Bertis Ruddy, BS, ACSM CEP, Exercise Physiologist   Supervising physician immediately available to respond to emergencies [facility] immediately available ER MD   Physician(s) Orvilla Cornwall         Goals Met:  Proper associated with RPD/PD & O2 Sat Independence with exercise equipment Exercise tolerated well Strength training completed today  Goals Unmet:  Not Applicable  Comments: Pt able to follow exercise prescription today without complaint.  Will continue to monitor for progression.    Dr. Emily Filbert is Medical Director for [place] and [facility].

## 2016-04-11 ENCOUNTER — Encounter: Payer: Medicare Other | Admitting: *Deleted

## 2016-04-11 DIAGNOSIS — I272 Other secondary pulmonary hypertension: Secondary | ICD-10-CM | POA: Diagnosis not present

## 2016-04-11 DIAGNOSIS — I2721 Secondary pulmonary arterial hypertension: Secondary | ICD-10-CM

## 2016-04-11 DIAGNOSIS — J449 Chronic obstructive pulmonary disease, unspecified: Secondary | ICD-10-CM | POA: Diagnosis not present

## 2016-04-11 NOTE — Progress Notes (Signed)
Daily Session Note  Patient Details  Name: Kristen Montgomery MRN: [id number] Date of Birth: 10-Oct-1950 Referring Provider:   Flowsheet Row Pulmonary Rehab from 03/20/2016 in [facility] Cardiac and Pulmonary Rehab  Referring Provider  [name]      Encounter Date: 04/11/2016  Check In:     Session Check In - 04/11/16 1252      Check-In   Location [facility]-Cardiac & Pulmonary Rehab   Staff Present Heath Lark, RN, BSN, CCRP;Laureen Owens Shark, BS, RRT, Respiratory Therapist;Yamato Kopf, RN, BSN   Supervising physician immediately available to respond to emergencies [facility] immediately available ER MD   Physician(s) Dr. Alfred Levins and Dr. Joni Fears   Medication changes reported     No   Fall or balance concerns reported    No   Warm-up and Cool-down Performed as group-led instruction   Resistance Training Performed Yes   VAD Patient? No     Pain Assessment   Currently in Pain? No/denies         Goals Met:  Proper associated with RPD/PD & O2 Sat Exercise tolerated well  Goals Unmet:  Not Applicable  Comments:     Dr. Emily Filbert is Medical Director for [place] and [facility].

## 2016-04-13 ENCOUNTER — Encounter: Payer: Medicare Other | Admitting: Respiratory Therapy

## 2016-04-13 DIAGNOSIS — I272 Other secondary pulmonary hypertension: Secondary | ICD-10-CM | POA: Diagnosis not present

## 2016-04-13 DIAGNOSIS — J449 Chronic obstructive pulmonary disease, unspecified: Secondary | ICD-10-CM | POA: Diagnosis not present

## 2016-04-13 DIAGNOSIS — I2721 Secondary pulmonary arterial hypertension: Secondary | ICD-10-CM

## 2016-04-13 NOTE — Progress Notes (Signed)
Daily Session Note  Patient Details  Name: ALOISE COPUS MRN: [id number] Date of Birth: 1951/03/30 Referring Provider:   Flowsheet Row Pulmonary Rehab from 03/20/2016 in [facility] Cardiac and Pulmonary Rehab  Referring Provider  [name]      Encounter Date: 04/13/2016  Check In:     Session Check In - 04/13/16 1234      Check-In   Location [facility]-Cardiac & Pulmonary Rehab   Staff Present Nyoka Cowden, RN, BSN, MA;Susanne Bice, RN, BSN, CCRP;Geneveive Furness Blanch Media, RRT, RCP, Respiratory Therapist   Supervising physician immediately available to respond to emergencies [facility] immediately available ER MD   Physician(s) Dr. Edd Fabian & Marcelene Butte   Medication changes reported     No   Fall or balance concerns reported    No   Warm-up and Cool-down Performed on first and last piece of equipment   Resistance Training Performed Yes   VAD Patient? No     Pain Assessment   Currently in Pain? No/denies         Goals Met:  Proper associated with RPD/PD & O2 Sat Independence with exercise equipment Using PLB without cueing & demonstrates good technique Exercise tolerated well Strength training completed today  Goals Unmet:  Not Applicable  Comments: Pt able to follow exercise prescription today without complaint.  Will continue to monitor for progression.    Dr. Emily Filbert is Medical Director for [place] and [facility].

## 2016-04-15 DIAGNOSIS — I270 Primary pulmonary hypertension: Secondary | ICD-10-CM | POA: Diagnosis not present

## 2016-04-16 ENCOUNTER — Encounter: Payer: Self-pay | Admitting: *Deleted

## 2016-04-16 ENCOUNTER — Encounter: Payer: Medicare Other | Attending: Internal Medicine

## 2016-04-16 DIAGNOSIS — J449 Chronic obstructive pulmonary disease, unspecified: Secondary | ICD-10-CM | POA: Insufficient documentation

## 2016-04-16 DIAGNOSIS — I272 Pulmonary hypertension, unspecified: Secondary | ICD-10-CM | POA: Diagnosis not present

## 2016-04-16 DIAGNOSIS — I2721 Secondary pulmonary arterial hypertension: Secondary | ICD-10-CM

## 2016-04-16 NOTE — Progress Notes (Signed)
Daily Session Note  Patient Details  Name: Kristen Montgomery MRN: [id number] Date of Birth: 27-Jan-1951 Referring Provider:   Flowsheet Row Pulmonary Rehab from 03/20/2016 in [facility] Cardiac and Pulmonary Rehab  Referring Provider  [name]      Encounter Date: 04/16/2016  Check In:     Session Check In - 04/16/16 1456      Check-In   Location [facility]-Cardiac & Pulmonary Rehab   Staff Present Carson Myrtle, BS, RRT, Respiratory Therapist;Kelly Amedeo Plenty, BS, ACSM CEP, Exercise Physiologist;Davione Lenker Oletta Darter, BA, ACSM CEP, Exercise Physiologist   Supervising physician immediately available to respond to emergencies [facility] immediately available ER MD   Physician(s) Jacqualine Code and Lord   Medication changes reported     No   Fall or balance concerns reported    No   Warm-up and Cool-down Performed as group-led instruction   Resistance Training Performed Yes   VAD Patient? No     Pain Assessment   Currently in Pain? No/denies   Multiple Pain Sites No         Goals Met:  Proper associated with RPD/PD & O2 Sat Independence with exercise equipment Exercise tolerated well Strength training completed today  Goals Unmet:  Not Applicable  Comments: Pt able to follow exercise prescription today without complaint.  Will continue to monitor for progression.    Dr. Emily Filbert is Medical Director for [place] and [facility].

## 2016-04-16 NOTE — Progress Notes (Signed)
Pulmonary Individual Treatment Plan  Patient Details  Name: Kristen Montgomery MRN: [id number] Date of Birth: 1951-04-02 Referring Provider:   Flowsheet Row Pulmonary Rehab from 03/20/2016 in [facility] Cardiac and Pulmonary Rehab  Referring Provider  [name]      Initial Encounter Date:  Flowsheet Row Pulmonary Rehab from 03/20/2016 in [facility] Cardiac and Pulmonary Rehab  Date  03/20/16  Referring Provider  [name]      Visit Diagnosis: PAH (pulmonary artery hypertension)  COPD, severe ([place])  Patient's Home Medications on Admission:  Current Outpatient Prescriptions:  .  albuterol (ACCUNEB) 0.63 MG/3ML nebulizer solution, Inhale 1 ampule by nebulization every six (6) hours as needed for wheezing., Disp: , Rfl:  .  albuterol (VENTOLIN HFA) 108 (90 Base) MCG/ACT inhaler, INHALE 2 PUFFS BY MOUTH EVERY 4 TO 6 HOURS AS NEEDED FOR SHORTNESS OF BREATH, COUGH OR WHEEZE, Disp: , Rfl:  .  aspirin EC 81 MG tablet, Take by mouth., Disp: , Rfl:  .  carvedilol (COREG) 3.125 MG tablet, Take 3.125 mg by mouth., Disp: , Rfl:  .  docusate sodium (COLACE) 100 MG capsule, Take 100 mg by mouth., Disp: , Rfl:  .  furosemide (LASIX) 40 MG tablet, Take 40 mg by mouth., Disp: , Rfl:  .  losartan (COZAAR) 50 MG tablet, Take 50 mg by mouth 1 day or 1 dose., Disp: , Rfl:  .  omeprazole (PRILOSEC) 20 MG capsule, Take 20 mg by mouth., Disp: , Rfl:  .  potassium chloride SA (K-DUR,KLOR-CON) 20 MEQ tablet, Take 10 mEq by mouth., Disp: , Rfl:  .  tiotropium (SPIRIVA HANDIHALER) 18 MCG inhalation capsule, INHALE 1 CAPSULE VIA HANDIHALER ONCE DAILY AT THE SAME TIME EVERY DAY, Disp: , Rfl:  .  traZODone (DESYREL) 50 MG tablet, TAKE 1 TABLET BY MOUTH AT BEDTIME AS NEEDED, Disp: , Rfl:  .  Treprostinil (TYVASO) 0.6 MG/ML SOLN, Inhale 3-9 breaths four times daily., Disp: , Rfl:   Past Medical History: No past medical history on file.  Tobacco Use: History  Smoking Status  . Not on file  Smokeless Tobacco  . Not on file     Labs: Recent Review Flowsheet Data    There is no flowsheet data to display.       ADL UCSD:     Pulmonary Assessment Scores    Row Name 03/20/16 1435         ADL UCSD   ADL Phase Entry     SOB Score total 68     Rest 0     Walk 2     Stairs 4     Bath 2     Dress 3     Shop 3        Pulmonary Function Assessment:     Pulmonary Function Assessment - 03/20/16 1433      Initial Spirometry Results   FVC% 31 %   FEV1% 27 %   FEV1/FVC Ratio 68.6   Comments Test date 03/20/2016      Post Bronchodilator Spirometry Results   FVC% 46 %   FEV1% 29 %   FEV1/FVC Ratio 62.47     Breath   Bilateral Breath Sounds Decreased   Shortness of Breath Yes;Fear of Shortness of Breath;Limiting activity      Exercise Target Goals:    Exercise Program Goal: Individual exercise prescription set with THRR, safety & activity barriers. Participant demonstrates ability to understand and report RPE using BORG scale, to self-measure pulse accurately, and to  acknowledge the importance of the exercise prescription.  Exercise Prescription Goal: Starting with aerobic activity 30 plus minutes a day, 3 days per week for initial exercise prescription. Provide home exercise prescription and guidelines that participant acknowledges understanding prior to discharge.  Activity Barriers & Risk Stratification:     Activity Barriers & Cardiac Risk Stratification - 03/20/16 1432      Activity Barriers & Cardiac Risk Stratification   Activity Barriers Shortness of Breath;Deconditioning   Cardiac Risk Stratification Moderate      6 Minute Walk:     6 Minute Walk    Row Name 03/20/16 1332         6 Minute Walk   Distance 340 feet     Walk Time 3 minutes     # of Rest Breaks 3     MPH 1.28     METS 1.17     RPE 13     Perceived Dyspnea  4     VO2 Peak 4.11     Symptoms No     Resting HR 96 bpm     Resting BP 128/60     Max Ex. HR 116 bpm     Max Ex. BP 142/62     2 Minute  Post BP 104/64       Interval HR   Baseline HR 96     1 Minute HR 113     2 Minute HR 117     3 Minute HR 114     5 Minute HR 116     2 Minute Post HR 100     Interval Heart Rate? Yes       Interval Oxygen   Interval Oxygen? Yes     Baseline Oxygen Saturation % 93 %     Baseline Liters of Oxygen 4 L     1 Minute Oxygen Saturation % 93 %     1 Minute Liters of Oxygen 4 L     2 Minute Oxygen Saturation % 88 %     2 Minute Liters of Oxygen 4 L     3 Minute Oxygen Saturation % 93 %     3 Minute Liters of Oxygen 4 L     4 Minute Liters of Oxygen 4 L     5 Minute Oxygen Saturation % 86 %     5 Minute Liters of Oxygen 4 L     6 Minute Liters of Oxygen 4 L     2 Minute Post Oxygen Saturation % 100 %     2 Minute Post Liters of Oxygen 4 L        Initial Exercise Prescription:     Initial Exercise Prescription - 03/20/16 1300      Date of Initial Exercise RX and Referring Provider   Date 03/20/16   Referring Provider [name]     Oxygen   Oxygen Continuous   Liters 4     Treadmill   MPH 0.5   Grade 0   Minutes 15  2 then rest and repeat     NuStep   Level 1   Minutes 15  2 then rest and repeat   METs 1     Arm Ergometer   Level 1   Minutes 15  2 min then rest and repeat     T5 Nustep   Level 1   Minutes 15  2 min then rest and repeat   METs 1  Prescription Details   Frequency (times per week) 3   Duration Progress to 30 minutes of continuous aerobic without signs/symptoms of physical distress     Intensity   THRR 40-80% of Max Heartrate 120-144   Ratings of Perceived Exertion 11-13   Perceived Dyspnea 0-4     Progression   Progression Continue to progress workloads to maintain intensity without signs/symptoms of physical distress.     Resistance Training   Training Prescription Yes   Weight 1   Reps 10-15      Perform Capillary Blood Glucose checks as needed.  Exercise Prescription Changes:     Exercise Prescription Changes    Row Name  03/26/16 1300 04/12/16 1100           Response to Exercise   Blood Pressure (Admit) 130/66 124/78      Blood Pressure (Exercise) 130/72 130/78      Blood Pressure (Exit) 120/88 120/72      Heart Rate (Admit) 92 bpm 91 bpm      Heart Rate (Exercise) 117 bpm 117 bpm      Heart Rate (Exit) 88 bpm 86 bpm      Oxygen Saturation (Admit) 94 % 95 %      Oxygen Saturation (Exercise) 86 %  moved up to 6L - O2 increased to 90 90 %      Oxygen Saturation (Exit) 98 % 98 %      Rating of Perceived Exertion (Exercise) 13 13      Perceived Dyspnea (Exercise) 4 4      Duration Progress to 30 minutes of continuous aerobic without signs/symptoms of physical distress Progress to 45 minutes of aerobic exercise without signs/symptoms of physical distress      Intensity THRR unchanged THRR unchanged        Progression   Progression Continue to progress workloads to maintain intensity without signs/symptoms of physical distress. Continue to progress workloads to maintain intensity without signs/symptoms of physical distress.        Resistance Training   Training Prescription Yes  -      Weight 1  -      Reps 10-15  -        Interval Training   Interval Training No  -        Oxygen   Oxygen Continuous Continuous      Liters 6 4        Treadmill   MPH 0.8 0.8      Grade 0 0      Minutes 15  2/2/2 15  6/4/4        NuStep   Level 1 1      Minutes 15  5/3/8 15      METs 1  -        Biostep-RELP   Level  - 2      Minutes  - 15         Exercise Comments:     Exercise Comments    Row Name 03/20/16 1344 03/26/16 1325 04/12/16 1130       Exercise Comments Gracemarie uses a wheel chair - she did not seem to have trouble with balance other than being very deconditioned and not being able to walk very far. Aliece did well for her first day of exercise.  Her sats dropped to 86 at 2 L so she uses 6L contiuous during exercise. Arwyn is progressing well with exercise.  Discharge Exercise  Prescription (Final Exercise Prescription Changes):     Exercise Prescription Changes - 04/12/16 1100      Response to Exercise   Blood Pressure (Admit) 124/78   Blood Pressure (Exercise) 130/78   Blood Pressure (Exit) 120/72   Heart Rate (Admit) 91 bpm   Heart Rate (Exercise) 117 bpm   Heart Rate (Exit) 86 bpm   Oxygen Saturation (Admit) 95 %   Oxygen Saturation (Exercise) 90 %   Oxygen Saturation (Exit) 98 %   Rating of Perceived Exertion (Exercise) 13   Perceived Dyspnea (Exercise) 4   Duration Progress to 45 minutes of aerobic exercise without signs/symptoms of physical distress   Intensity THRR unchanged     Progression   Progression Continue to progress workloads to maintain intensity without signs/symptoms of physical distress.     Oxygen   Oxygen Continuous   Liters 4     Treadmill   MPH 0.8   Grade 0   Minutes 15  6/4/4     NuStep   Level 1   Minutes 15     Biostep-RELP   Level 2   Minutes 15       Nutrition:  Target Goals: Understanding of nutrition guidelines, daily intake of sodium <1545m, cholesterol <2041m calories 30% from fat and 7% or less from saturated fats, daily to have 5 or more servings of fruits and vegetables.  Biometrics:     Pre Biometrics - 03/20/16 1331      Pre Biometrics   Height _0  (1.499 m)   Weight 188 lb 6.4 oz (85.5 kg)   Waist Circumference 44.5 inches   Hip Circumference 51.13 inches   Waist to Hip Ratio 0.87 %   BMI (Calculated) 38.1       Nutrition Therapy Plan and Nutrition Goals:   Nutrition Discharge: Rate Your Plate Scores:   Psychosocial: Target Goals: Acknowledge presence or absence of depression, maximize coping skills, provide positive support system. Participant is able to verbalize types and ability to use techniques and skills needed for reducing stress and depression.  Initial Review & Psychosocial Screening:     Initial Psych Review & Screening - 03/20/16 1253      Initial Review    Current issues with Current Stress Concerns   Comments JeOvedaeports that she had tried to exercise in the [facility] gym but her father coded upstairs in the hospital and died. JeMerceraid she had not been back to exercise since then. JeAxelaid that she knows she needs a hip surgery but she is not sure when the doctor can do it. We discussed that Pulm Rehab can get her strength up for her upcoming hip surgery.      Family Dynamics   Good Support System? Yes   Concerns Recent loss of significant other   Comments Her father died since she has attended Pulm REhab last     Barriers   Psychosocial barriers to participate in program The patient should benefit from training in stress management and relaxation.     Screening Interventions   Interventions Encouraged to exercise      Quality of Life Scores:     Quality of Life - 03/20/16 1439      Quality of Life Scores   Health/Function Pre 20.93 %   Socioeconomic Pre 21 %   Psych/Spiritual Pre 21 %   Family Pre 21 %   GLOBAL Pre 20.97 %      PHQ-9: Recent Review Flowsheet  Data    Depression screen [facility] 2/9 03/20/2016   Decreased Interest 0   Down, Depressed, Hopeless 0   PHQ - 2 Score 0   Altered sleeping 0   Tired, decreased energy 0   Change in appetite 0   Feeling bad or failure about yourself  0   Trouble concentrating 0   Moving slowly or fidgety/restless 0   Suicidal thoughts 0   PHQ-9 Score 0      Psychosocial Evaluation and Intervention:     Psychosocial Evaluation - 03/26/16 1235      Psychosocial Evaluation & Interventions   Interventions Encouraged to exercise with the program and follow exercise prescription   Comments Counselor met with Ms. Iott today for initial psychosocial evaluation.  She is a 34 year ol who suffers from COPD and Pulmonary hypertension.  Ms. Severtson has a strong support system with a spouse of 26 years; several adult children who live closeby and active involvement in her local church.   She had hip replacement of right hip in 2013 and reports scheduled for left hip replacement next month.  Ms. Chauncey Cruel states she sleeps okay with the medications prescribed.  She denies a history of depression or anxiety and states she is typically in a positive mood most of the time.  She states that other than her health she has minimal stress in her life.  Ms. Marcano has goals to increase her stamina and strength while in this program and possibly lose some weight as well.        Psychosocial Re-Evaluation:  Education: Education Goals: Education classes will be provided on a weekly basis, covering required topics. Participant will state understanding/return demonstration of topics presented.  Learning Barriers/Preferences:     Learning Barriers/Preferences - 03/20/16 1432      Learning Barriers/Preferences   Learning Barriers None   Learning Preferences Group Instruction;Individual Instruction;Pictoral;Skilled Demonstration;Verbal Instruction;Video;Written Material      Education Topics: Initial Evaluation Education: - Verbal, written and demonstration of respiratory meds, RPE/PD scales, oximetry and breathing techniques. Instruction on use of nebulizers and MDIs: cleaning and proper use, rinsing mouth with steroid doses and importance of monitoring MDI activations. Flowsheet Row Pulmonary Rehab from 04/04/2016 in [facility] Cardiac and Pulmonary Rehab  Date  03/20/16  Educator  LB  Instruction Review Code  2- meets goals/outcomes      General Nutrition Guidelines/Fats and Fiber: -Group instruction provided by verbal, written material, models and posters to present the general guidelines for heart healthy nutrition. Gives an explanation and review of dietary fats and fiber.   Controlling Sodium/Reading Food Labels: -Group verbal and written material supporting the discussion of sodium use in heart healthy nutrition. Review and explanation with models, verbal and written materials for  utilization of the food label.   Exercise Physiology & Risk Factors: - Group verbal and written instruction with models to review the exercise physiology of the cardiovascular system and associated critical values. Details cardiovascular disease risk factors and the goals associated with each risk factor.   Aerobic Exercise & Resistance Training: - Gives group verbal and written discussion on the health impact of inactivity. On the components of aerobic and resistive training programs and the benefits of this training and how to safely progress through these programs. Flowsheet Row Pulmonary Rehab from 04/04/2016 in [facility] Cardiac and Pulmonary Rehab  Date  04/04/16  Educator  JH/AS  Instruction Review Code  2- meets goals/outcomes      Flexibility, Balance, General Exercise Guidelines: -  Provides group verbal and written instruction on the benefits of flexibility and balance training programs. Provides general exercise guidelines with specific guidelines to those with heart or lung disease. Demonstration and skill practice provided.   Stress Management: - Provides group verbal and written instruction about the health risks of elevated stress, cause of high stress, and healthy ways to reduce stress.   Depression: - Provides group verbal and written instruction on the correlation between heart/lung disease and depressed mood, treatment options, and the stigmas associated with seeking treatment.   Exercise & Equipment Safety: - Individual verbal instruction and demonstration of equipment use and safety with use of the equipment. Flowsheet Row Pulmonary Rehab from 04/04/2016 in [facility] Cardiac and Pulmonary Rehab  Date  03/26/16  Educator  AS  Instruction Review Code  2- meets goals/outcomes      Infection Prevention: - Provides verbal and written material to individual with discussion of infection control including proper hand washing and proper equipment cleaning during exercise  session. Flowsheet Row Pulmonary Rehab from 04/04/2016 in [facility] Cardiac and Pulmonary Rehab  Date  03/26/16  Educator  AS  Instruction Review Code  2- meets goals/outcomes      Falls Prevention: - Provides verbal and written material to individual with discussion of falls prevention and safety. Flowsheet Row Pulmonary Rehab from 04/04/2016 in [facility] Cardiac and Pulmonary Rehab  Date  03/20/16  Educator  C. Enterkin Therapist, sports  Instruction Review Code  1- partially meets, needs review/practice      Diabetes: - Individual verbal and written instruction to review signs/symptoms of diabetes, desired ranges of glucose level fasting, after meals and with exercise. Advice that pre and post exercise glucose checks will be done for 3 sessions at entry of program.   Chronic Lung Diseases: - Group verbal and written instruction to review new updates, new respiratory medications, new advancements in procedures and treatments. Provide informative websites and "800" numbers of self-education.   Lung Procedures: - Group verbal and written instruction to describe testing methods done to diagnose lung disease. Review the outcome of test results. Describe the treatment choices: Pulmonary Function Tests, ABGs and oximetry.   Energy Conservation: - Provide group verbal and written instruction for methods to conserve energy, plan and organize activities. Instruct on pacing techniques, use of adaptive equipment and posture/positioning to relieve shortness of breath.   Triggers: - Group verbal and written instruction to review types of environmental controls: home humidity, furnaces, filters, dust mite/pet prevention, HEPA vacuums. To discuss weather changes, air quality and the benefits of nasal washing. Flowsheet Row Pulmonary Rehab from 04/04/2016 in [facility] Cardiac and Pulmonary Rehab  Date  03/26/16  Educator  LB  Instruction Review Code  2- meets goals/outcomes      Exacerbations: - Group verbal and written  instruction to provide: warning signs, infection symptoms, calling MD promptly, preventive modes, and value of vaccinations. Review: effective airway clearance, coughing and/or vibration techniques. Create an Sports administrator.   Oxygen: - Individual and group verbal and written instruction on oxygen therapy. Includes supplement oxygen, available portable oxygen systems, continuous and intermittent flow rates, oxygen safety, concentrators, and Medicare reimbursement for oxygen.   Respiratory Medications: - Group verbal and written instruction to review medications for lung disease. Drug class, frequency, complications, importance of spacers, rinsing mouth after steroid MDI's, and proper cleaning methods for nebulizers.   AED/CPR: - Group verbal and written instruction with the use of models to demonstrate the basic use of the AED with the basic ABC's  of resuscitation.   Breathing Retraining: - Provides individuals verbal and written instruction on purpose, frequency, and proper technique of diaphragmatic breathing and pursed-lipped breathing. Applies individual practice skills. Flowsheet Row Pulmonary Rehab from 04/04/2016 in [facility] Cardiac and Pulmonary Rehab  Date  03/20/16  Educator  C. EnterkinRN  Instruction Review Code  2- meets Designer, fashion/clothing and Physiology of the Lungs: - Group verbal and written instruction with the use of models to provide basic lung anatomy and physiology related to function, structure and complications of lung disease.   Heart Failure: - Group verbal and written instruction on the basics of heart failure: signs/symptoms, treatments, explanation of ejection fraction, enlarged heart and cardiomyopathy.   Sleep Apnea: - Individual verbal and written instruction to review Obstructive Sleep Apnea. Review of risk factors, methods for diagnosing and types of masks and machines for OSA.   Anxiety: - Provides group, verbal and written instruction on the  correlation between heart/lung disease and anxiety, treatment options, and management of anxiety.   Relaxation: - Provides group, verbal and written instruction about the benefits of relaxation for patients with heart/lung disease. Also provides patients with examples of relaxation techniques.   Knowledge Questionnaire Score:     Knowledge Questionnaire Score - 03/20/16 1432      Knowledge Questionnaire Score   Pre Score 9/10       Core Components/Risk Factors/Patient Goals at Admission:     Personal Goals and Risk Factors at Admission - 03/20/16 1438      Core Components/Risk Factors/Patient Goals on Admission   Develop more efficient breathing techniques such as purse lipped breathing and diaphragmatic breathing; and practicing self-pacing with activity Yes   Intervention Provide education, demonstration and support about specific breathing techniuqes utilized for more efficient breathing. Include techniques such as pursed lipped breathing, diaphragmatic breathing and self-pacing activity.   Expected Outcomes Short Term: Participant will be able to demonstrate and use breathing techniques as needed throughout daily activities.   Increase knowledge of respiratory medications and ability to use respiratory devices properly  Yes   Intervention Provide education and demonstration as needed of appropriate use of medications, inhalers, and oxygen therapy.   Expected Outcomes Short Term: Achieves understanding of medications use. Understands that oxygen is a medication prescribed by physician. Demonstrates appropriate use of inhaler and oxygen therapy.   Hypertension Yes   Intervention Provide education on lifestyle modifcations including regular physical activity/exercise, weight management, moderate sodium restriction and increased consumption of fresh fruit, vegetables, and low fat dairy, alcohol moderation, and smoking cessation.;Monitor prescription use compliance.   Expected Outcomes  Short Term: Continued assessment and intervention until BP is < 140/19m HG in hypertensive participants. < 130/878mHG in hypertensive participants with diabetes, heart failure or chronic kidney disease.;Long Term: Maintenance of blood pressure at goal levels.      Core Components/Risk Factors/Patient Goals Review:      Goals and Risk Factor Review    Row Name 03/30/16 1247 04/04/16 1307 04/11/16 1254         Core Components/Risk Factors/Patient Goals Review   Personal Goals Review Sedentary;Increase Strength and Stamina Weight Management/Obesity;Hypertension  -     Review Ms. JeBryleighs not doing much at home.  She is getting here three days a week for exercise.  She felt better after her last round of rehab, and hopes to do the same this time around. JeLoretaaid she used to weight 129-130 before she quit smoking. JeElizzieaid then she  was 150lbs when she started getting sick but now weight is up to 189lbs. Amyrie's blood pressure is very good.  Mckenzi said she doesn't have any problems with her inhalers. Arliene said she knows that she can not get better but she is trying not to get  worse. Continues to use pursed lip breathing and she is on supplemental oxgyen.      Expected Outcomes We will go over home exercise with Romie Minus and continue to monitor for improvements in strength and stamina. To lose 2 lbs. Cont. stable blood pressure.  Continued use of her inhalers and pursed lip breathing techniques        Core Components/Risk Factors/Patient Goals at Discharge (Final Review):      Goals and Risk Factor Review - 04/11/16 1254      Core Components/Risk Factors/Patient Goals Review   Review Leverne said she doesn't have any problems with her inhalers. Laprecious said she knows that she can not get better but she is trying not to get  worse. Continues to use pursed lip breathing and she is on supplemental oxgyen.    Expected Outcomes Continued use of her inhalers and pursed lip breathing techniques      ITP  Comments:     ITP Comments    Row Name 03/20/16 1251 04/12/16 1130         ITP Comments Melane reports that she had tried to exercise in the [facility] but her father coded upstairs in the hospital and died. Jannely said she had not been back to exercise since then. Andreia said that she knows she needs a hip surgery but she is not sure when the doctor can do it. We discussed that Pulm Rehab can get her strength up for her upcoming hip surgery.  Kamry is progressing well with exercise.         Comments: 30 Day Review

## 2016-04-18 ENCOUNTER — Inpatient Hospital Stay: Admission: RE | Admit: 2016-04-18 | Payer: Medicare Other | Source: Ambulatory Visit

## 2016-04-19 DIAGNOSIS — I50812 Chronic right heart failure: Secondary | ICD-10-CM | POA: Diagnosis not present

## 2016-04-19 DIAGNOSIS — I272 Pulmonary hypertension, unspecified: Secondary | ICD-10-CM | POA: Diagnosis not present

## 2016-04-19 DIAGNOSIS — I10 Essential (primary) hypertension: Secondary | ICD-10-CM | POA: Diagnosis not present

## 2016-04-19 DIAGNOSIS — J449 Chronic obstructive pulmonary disease, unspecified: Secondary | ICD-10-CM | POA: Diagnosis not present

## 2016-04-19 DIAGNOSIS — I456 Pre-excitation syndrome: Secondary | ICD-10-CM | POA: Diagnosis not present

## 2016-04-20 ENCOUNTER — Encounter: Payer: Medicare Other | Admitting: *Deleted

## 2016-04-20 DIAGNOSIS — J449 Chronic obstructive pulmonary disease, unspecified: Secondary | ICD-10-CM

## 2016-04-20 DIAGNOSIS — I2721 Secondary pulmonary arterial hypertension: Secondary | ICD-10-CM

## 2016-04-20 DIAGNOSIS — I272 Pulmonary hypertension, unspecified: Secondary | ICD-10-CM | POA: Diagnosis not present

## 2016-04-20 NOTE — Progress Notes (Signed)
Daily Session Note  Patient Details  Name: Kristen Montgomery MRN: [id number] Date of Birth: [birth year] Referring Provider:   Flowsheet Row Pulmonary Rehab from 03/20/2016 in [facility] Cardiac and Pulmonary Rehab  Referring Provider  [name]      Encounter Date: 04/20/2016  Check In:     Session Check In - 04/20/16 1329      Check-In   Location [facility]-Cardiac & Pulmonary Rehab   Staff Present Alberteen Sam, MA, ACSM RCEP, Exercise Physiologist;Laureen Owens Shark, BS, RRT, Respiratory Therapist;Carroll Enterkin, RN, BSN   Supervising physician immediately available to respond to emergencies See telemetry face sheet for immediately available ER MD   Physician(s) Drs. Izola Price and Lord   Medication changes reported     No   Fall or balance concerns reported    No   Warm-up and Cool-down Performed as group-led Location manager Performed No   VAD Patient? No     Pain Assessment   Currently in Pain? No/denies   Multiple Pain Sites No         Goals Met:  Proper associated with RPD/PD & O2 Sat Independence with exercise equipment Using PLB without cueing & demonstrates good technique Exercise tolerated well Strength training completed today  Goals Unmet:  Not Applicable  Comments: Pt able to follow exercise prescription today without complaint.  Will continue to monitor for progression.    Dr. Emily Filbert is Medical Director for [place] and [facility].

## 2016-04-21 DIAGNOSIS — J449 Chronic obstructive pulmonary disease, unspecified: Secondary | ICD-10-CM | POA: Diagnosis not present

## 2016-04-23 DIAGNOSIS — I2721 Secondary pulmonary arterial hypertension: Secondary | ICD-10-CM

## 2016-04-23 DIAGNOSIS — J449 Chronic obstructive pulmonary disease, unspecified: Secondary | ICD-10-CM

## 2016-04-23 DIAGNOSIS — I272 Pulmonary hypertension, unspecified: Secondary | ICD-10-CM | POA: Diagnosis not present

## 2016-04-23 NOTE — Progress Notes (Signed)
Daily Session Note  Patient Details  Name: Kristen Montgomery MRN: [id number] Date of Birth: [birth year] Referring Provider:   Flowsheet Row Pulmonary Rehab from 03/20/2016 in [facility] Cardiac and Pulmonary Rehab  Referring Provider  [name]      Encounter Date: 04/23/2016  Check In:     Session Check In - 04/23/16 1525      Check-In   Location [facility]-Cardiac & Pulmonary Rehab   Staff Present Carson Myrtle, BS, RRT, Respiratory Therapist;Kelly Amedeo Plenty, BS, ACSM CEP, Exercise Physiologist;Amanda Oletta Darter, BA, ACSM CEP, Exercise Physiologist   Supervising physician immediately available to respond to emergencies [facility] immediately available ER MD   Physician(s) Burlene Arnt and Marcelene Butte   Medication changes reported     No   Fall or balance concerns reported    No   Warm-up and Cool-down Performed as group-led instruction   Resistance Training Performed No   VAD Patient? No     Pain Assessment   Currently in Pain? No/denies         Goals Met:  Proper associated with RPD/PD & O2 Sat Independence with exercise equipment Exercise tolerated well Strength training completed today  Goals Unmet:  Not Applicable  Comments: Pt able to follow exercise prescription today without complaint.  Will continue to monitor for progression.    Dr. Emily Filbert is Medical Director for [place] and [facility].

## 2016-04-25 ENCOUNTER — Encounter: Payer: Medicare Other | Admitting: *Deleted

## 2016-04-25 DIAGNOSIS — J449 Chronic obstructive pulmonary disease, unspecified: Secondary | ICD-10-CM | POA: Diagnosis not present

## 2016-04-25 DIAGNOSIS — I272 Pulmonary hypertension, unspecified: Secondary | ICD-10-CM | POA: Diagnosis not present

## 2016-04-25 NOTE — Progress Notes (Signed)
Daily Session Note  Patient Details  Name: Kristen Montgomery MRN: [id number] Date of Birth: Dec 07, 1950 Referring Provider:   April Manson Pulmonary Rehab from 03/20/2016 in [facility] Cardiac and Pulmonary Rehab  Referring Provider  Merrily Brittle Date: 04/25/2016  Check In:     Session Check In - 04/25/16 1217      Check-In   Location [facility]-Cardiac & Pulmonary Rehab   Staff Present Gerlene Burdock, RN, BSN;Laureen Owens Shark, BS, RRT, Respiratory Lennie Hummer, MA, ACSM RCEP, Exercise Physiologist   Supervising physician immediately available to respond to emergencies [facility] immediately available ER MD   Physician(s) Dr. Reita Cliche and DR. Quentin Cornwall   Medication changes reported     No   Warm-up and Cool-down Performed as group-led Location manager Performed Yes   VAD Patient? No     Pain Assessment   Currently in Pain? No/denies           Exercise Prescription Changes - 04/24/16 1500      Exercise Review   Progression Yes     Response to Exercise   Blood Pressure (Admit) 98/60   Blood Pressure (Exercise) 126/70   Blood Pressure (Exit) 100/60   Heart Rate (Admit) 100 bpm   Heart Rate (Exercise) 131 bpm   Heart Rate (Exit) 96 bpm   Oxygen Saturation (Admit) 91 %   Oxygen Saturation (Exercise) 88 %   Oxygen Saturation (Exit) 99 %   Rating of Perceived Exertion (Exercise) 13   Perceived Dyspnea (Exercise) 4   Symptoms none   Duration Progress to 45 minutes of aerobic exercise without signs/symptoms of physical distress   Intensity THRR unchanged     Progression   Progression Continue to progress workloads to maintain intensity without signs/symptoms of physical distress.   Average METs 11.63     Resistance Training   Training Prescription Yes   Weight 1   Reps 10-15     Interval Training   Interval Training No     Oxygen   Oxygen Continuous   Liters 4     Treadmill   MPH 0.8   Grade 0   Minutes 15  5 min x3     NuStep   Level 2   Minutes 15   METs 2.3     Biostep-RELP   Level 2   Minutes 15   METs 1      Goals Met:  Proper associated with RPD/PD & O2 Sat Exercise tolerated well  Goals Unmet:  Not Applicable  Comments:     Dr. Emily Filbert is Medical Director for [place] and [facility].

## 2016-04-27 ENCOUNTER — Encounter: Payer: Medicare Other | Admitting: *Deleted

## 2016-04-27 DIAGNOSIS — I2721 Secondary pulmonary arterial hypertension: Secondary | ICD-10-CM

## 2016-04-27 DIAGNOSIS — J449 Chronic obstructive pulmonary disease, unspecified: Secondary | ICD-10-CM | POA: Diagnosis not present

## 2016-04-27 DIAGNOSIS — I272 Pulmonary hypertension, unspecified: Secondary | ICD-10-CM | POA: Diagnosis not present

## 2016-04-27 NOTE — Progress Notes (Signed)
Daily Session Note  Patient Details  Name: Kristen Montgomery MRN: [id number] Date of Birth: 06/21/1951 Referring Provider:   Flowsheet Row Pulmonary Rehab from 03/20/2016 in [facility] Cardiac and Pulmonary Rehab  Referring Provider  Merrily Brittle Date: 04/27/2016  Check In:     Session Check In - 04/27/16 1142      Check-In   Location [facility]-Cardiac & Pulmonary Rehab   Staff Present Alberteen Sam, MA, ACSM RCEP, Exercise Physiologist;Susanne Bice, RN, BSN, CCRP;Carroll Enterkin, RN, BSN   Supervising physician immediately available to respond to emergencies [facility] immediately available ER MD   Physician(s) Drs. Kinner and [employer]   Medication changes reported     No   Fall or balance concerns reported    No   Warm-up and Cool-down Performed as group-led Location manager Performed Yes   VAD Patient? No     Pain Assessment   Currently in Pain? No/denies   Multiple Pain Sites No         Goals Met:  Proper associated with RPD/PD & O2 Sat Independence with exercise equipment Using PLB without cueing & demonstrates good technique Exercise tolerated well No report of cardiac concerns or symptoms Strength training completed today  Goals Unmet:  Not Applicable  Comments: Pt able to follow exercise prescription today without complaint.  Will continue to monitor for progression.    Dr. Emily Filbert is Medical Director for [place] and [facility].

## 2016-05-01 ENCOUNTER — Inpatient Hospital Stay: Admission: RE | Admit: 2016-05-01 | Payer: Medicare Other | Source: Ambulatory Visit | Admitting: Orthopedic Surgery

## 2016-05-01 ENCOUNTER — Encounter: Admission: RE | Payer: Self-pay | Source: Ambulatory Visit

## 2016-05-01 SURGERY — ARTHROPLASTY, HIP, TOTAL, ANTERIOR APPROACH
Anesthesia: Choice | Laterality: Left

## 2016-05-07 DIAGNOSIS — I2721 Secondary pulmonary arterial hypertension: Secondary | ICD-10-CM

## 2016-05-07 DIAGNOSIS — I272 Pulmonary hypertension, unspecified: Secondary | ICD-10-CM | POA: Diagnosis not present

## 2016-05-07 DIAGNOSIS — J449 Chronic obstructive pulmonary disease, unspecified: Secondary | ICD-10-CM

## 2016-05-07 NOTE — Progress Notes (Signed)
Daily Session Note  Patient Details  Name: Kristen Montgomery MRN: [id number] Date of Birth: [birth year] Referring Provider:   Flowsheet Row Pulmonary Rehab from 03/20/2016 in [facility] Cardiac and Pulmonary Rehab  Referring Provider  [name]      Encounter Date: 05/07/2016  Check In:     Session Check In - 05/07/16 1248      Check-In   Location [facility]-Cardiac & Pulmonary Rehab   Staff Present Carson Myrtle, BS, RRT, Respiratory Therapist;Kelly Amedeo Plenty, BS, ACSM CEP, Exercise Physiologist;Joon Pohle Oletta Darter, BA, ACSM CEP, Exercise Physiologist   Supervising physician immediately available to respond to emergencies [facility] immediately available ER MD   Physician(s) Kerman Passey and Yow   Medication changes reported     No   Fall or balance concerns reported    No   Warm-up and Cool-down Performed as group-led instruction   Resistance Training Performed Yes   VAD Patient? No     Pain Assessment   Currently in Pain? No/denies   Multiple Pain Sites No         Goals Met:  Proper associated with RPD/PD & O2 Sat Independence with exercise equipment Exercise tolerated well Strength training completed today  Goals Unmet:  Not Applicable  Comments: Pt able to follow exercise prescription today without complaint.  Will continue to monitor for progression.    Dr. Emily Filbert is Medical Director for [place] and [facility].

## 2016-05-11 ENCOUNTER — Encounter: Payer: Medicare Other | Admitting: *Deleted

## 2016-05-11 DIAGNOSIS — J449 Chronic obstructive pulmonary disease, unspecified: Secondary | ICD-10-CM

## 2016-05-11 DIAGNOSIS — I272 Pulmonary hypertension, unspecified: Secondary | ICD-10-CM | POA: Diagnosis not present

## 2016-05-11 DIAGNOSIS — I2721 Secondary pulmonary arterial hypertension: Secondary | ICD-10-CM

## 2016-05-11 NOTE — Progress Notes (Signed)
Daily Session Note  Patient Details  Name: Kristen Montgomery MRN: [id number] Date of Birth: Aug 21, 1950 Referring Provider:   Flowsheet Row Pulmonary Rehab from 03/20/2016 in [facility] Cardiac and Pulmonary Rehab  Referring Provider  [name]      Encounter Date: 05/11/2016  Check In:     Session Check In - 05/11/16 1250      Check-In   Staff Present Heath Lark, RN, BSN, CCRP;Laureen Owens Shark, BS, RRT, Respiratory Lennie Hummer, MA, ACSM RCEP, Exercise Physiologist   Supervising physician immediately available to respond to emergencies [facility] immediately available ER MD   Physician(s) Drs: Corky Downs and Alfred Levins   Medication changes reported     No   Fall or balance concerns reported    No   Warm-up and Cool-down Performed as group-led instruction   Resistance Training Performed Yes   VAD Patient? No     Pain Assessment   Currently in Pain? No/denies         Goals Met:  Proper associated with RPD/PD & O2 Sat Independence with exercise equipment Using PLB without cueing & demonstrates good technique Exercise tolerated well Strength training completed today  Goals Unmet:  Not Applicable  Comments: Pt able to follow exercise prescription today without complaint.  Will continue to monitor for progression. Reviewed home exercise with pt today.  Pt plans to use pedaler at home for exercise.  Reviewed THR, pulse, RPE, sign and symptoms, and when to call 911 or MD.  Also discussed weather considerations and indoor options.  Pt voiced understanding.     [place] Name 03/20/16 1332 05/11/16 1331 05/11/16 1336     6 Minute Walk   Phase  - Mid Program Mid Program   Distance 340 feet  - 270 feet   Distance % Change  -  - -20.6 %   Walk Time 3 minutes  - 2.02 minutes   # of Rest Breaks 3  - 3  1:21, 20 sec, 2:18   MPH 1.28  - 1.52   METS 1.17  - 2.2   RPE 13  - 17   Perceived Dyspnea  4  - 4   VO2 Peak 4.11  - 2.95   Symptoms No  - Yes (comment)   Comments   -  - SOB, fatigue   Resting HR 96 bpm  - 96 bpm   Resting BP 128/60  - 122/62   Max Ex. HR 116 bpm  - 114 bpm   Max Ex. BP 142/62  - 126/74   2 Minute Post BP 104/64  - 122/70     Interval HR   Baseline HR 96  - 96   1 Minute HR 113  - 114   2 Minute HR 117  - 112   3 Minute HR 114  - 114   4 Minute HR  -  - 106   5 Minute HR 116  - 98   6 Minute HR  -  - 101   2 Minute Post HR 100  - 101   Interval Heart Rate? Yes  - Yes     Interval Oxygen   Interval Oxygen? Yes  - Yes   Baseline Oxygen Saturation % 93 %  - 95 %   Baseline Liters of Oxygen 4 L  - 3 L   1 Minute Oxygen Saturation % 93 %  - 91 %  57 sec 83%, 1:13 91%   1 Minute Liters of Oxygen  4 L  - 4 L   2 Minute Oxygen Saturation % 88 %  - 87 %   2 Minute Liters of Oxygen 4 L  - 4 L   3 Minute Oxygen Saturation % 93 %  - 85 %   3 Minute Liters of Oxygen 4 L  - 4 L   4 Minute Oxygen Saturation %  -  - 87 %  3:26 81%   4 Minute Liters of Oxygen 4 L  - 4 L   5 Minute Oxygen Saturation % 86 %  - 93 %   5 Minute Liters of Oxygen 4 L  - 4 L   6 Minute Oxygen Saturation %  -  - 87 %  7:00 85%   6 Minute Liters of Oxygen 4 L  - 4 L   2 Minute Post Oxygen Saturation % 100 %  - 90 %   2 Minute Post Liters of Oxygen 4 L  - 4 L         Dr. Emily Filbert is Medical Director for [place] and [facility].

## 2016-05-13 DIAGNOSIS — I270 Primary pulmonary hypertension: Secondary | ICD-10-CM | POA: Diagnosis not present

## 2016-05-14 ENCOUNTER — Encounter: Payer: Self-pay | Admitting: *Deleted

## 2016-05-14 ENCOUNTER — Encounter: Payer: Medicare Other | Admitting: *Deleted

## 2016-05-14 DIAGNOSIS — J449 Chronic obstructive pulmonary disease, unspecified: Secondary | ICD-10-CM

## 2016-05-14 DIAGNOSIS — I272 Pulmonary hypertension, unspecified: Secondary | ICD-10-CM | POA: Diagnosis not present

## 2016-05-14 DIAGNOSIS — I2721 Secondary pulmonary arterial hypertension: Secondary | ICD-10-CM

## 2016-05-14 NOTE — Progress Notes (Signed)
Pulmonary Individual Treatment Plan  Patient Details  Name: Kristen Montgomery MRN: [id number] Date of Birth: [birth year] Referring Provider:   Flowsheet Row Pulmonary Rehab from 03/20/2016 in [facility] Cardiac and Pulmonary Rehab  Referring Provider  [name]      Initial Encounter Date:  Flowsheet Row Pulmonary Rehab from 03/20/2016 in [facility] Cardiac and Pulmonary Rehab  Date  03/20/16  Referring Provider  [name]      Visit Diagnosis: COPD, severe ([place])  PAH (pulmonary artery hypertension)  Patient's Home Medications on Admission:  Current Outpatient Prescriptions:  .  albuterol (ACCUNEB) 0.63 MG/3ML nebulizer solution, Inhale 1 ampule by nebulization every six (6) hours as needed for wheezing., Disp: , Rfl:  .  albuterol (VENTOLIN HFA) 108 (90 Base) MCG/ACT inhaler, INHALE 2 PUFFS BY MOUTH EVERY 4 TO 6 HOURS AS NEEDED FOR SHORTNESS OF BREATH, COUGH OR WHEEZE, Disp: , Rfl:  .  aspirin EC 81 MG tablet, Take by mouth., Disp: , Rfl:  .  carvedilol (COREG) 3.125 MG tablet, Take 3.125 mg by mouth., Disp: , Rfl:  .  docusate sodium (COLACE) 100 MG capsule, Take 100 mg by mouth., Disp: , Rfl:  .  furosemide (LASIX) 40 MG tablet, Take 40 mg by mouth., Disp: , Rfl:  .  losartan (COZAAR) 50 MG tablet, Take 50 mg by mouth 1 day or 1 dose., Disp: , Rfl:  .  omeprazole (PRILOSEC) 20 MG capsule, Take 20 mg by mouth., Disp: , Rfl:  .  potassium chloride SA (K-DUR,KLOR-CON) 20 MEQ tablet, Take 10 mEq by mouth., Disp: , Rfl:  .  tiotropium (SPIRIVA HANDIHALER) 18 MCG inhalation capsule, INHALE 1 CAPSULE VIA HANDIHALER ONCE DAILY AT THE SAME TIME EVERY DAY, Disp: , Rfl:  .  traZODone (DESYREL) 50 MG tablet, TAKE 1 TABLET BY MOUTH AT BEDTIME AS NEEDED, Disp: , Rfl:  .  Treprostinil (TYVASO) 0.6 MG/ML SOLN, Inhale 3-9 breaths four times daily., Disp: , Rfl:   Past Medical History: No past medical history on file.  Tobacco Use: History  Smoking Status  . Not on file  Smokeless Tobacco  . Not on file     Labs: Recent Review Flowsheet Data    There is no flowsheet data to display.       ADL UCSD:     Pulmonary Assessment Scores    Row Name 03/20/16 1435 05/11/16 1324       ADL UCSD   ADL Phase Entry Mid    SOB Score total 68 74    Rest 0 0    Walk 2 4    Stairs 4 4    Bath 2 3    Dress 3 3    Shop 3 0       Pulmonary Function Assessment:     Pulmonary Function Assessment - 03/20/16 1433      Initial Spirometry Results   FVC% 31 %   FEV1% 27 %   FEV1/FVC Ratio 68.6   Comments Test date 03/20/2016      Post Bronchodilator Spirometry Results   FVC% 46 %   FEV1% 29 %   FEV1/FVC Ratio 62.47     Breath   Bilateral Breath Sounds Decreased   Shortness of Breath Yes;Fear of Shortness of Breath;Limiting activity      Exercise Target Goals:    Exercise Program Goal: Individual exercise prescription set with THRR, safety & activity barriers. Participant demonstrates ability to understand and report RPE using BORG scale, to self-measure pulse accurately, and to  acknowledge the importance of the exercise prescription.  Exercise Prescription Goal: Starting with aerobic activity 30 plus minutes a day, 3 days per week for initial exercise prescription. Provide home exercise prescription and guidelines that participant acknowledges understanding prior to discharge.  Activity Barriers & Risk Stratification:     Activity Barriers & Cardiac Risk Stratification - 03/20/16 1432      Activity Barriers & Cardiac Risk Stratification   Activity Barriers Shortness of Breath;Deconditioning   Cardiac Risk Stratification Moderate      6 Minute Walk:     6 Minute Walk    Row Name 03/20/16 1332 05/11/16 1331 05/11/16 1336     6 Minute Walk   Phase  - Mid Program Mid Program   Distance 340 feet  - 270 feet   Distance % Change  -  - -20.6 %   Walk Time 3 minutes  - 2.02 minutes   # of Rest Breaks 3  - 3  1:21, 20 sec, 2:18   MPH 1.28  - 1.52   METS 1.17  - 2.2   RPE  13  - 17   Perceived Dyspnea  4  - 4   VO2 Peak 4.11  - 2.95   Symptoms No  - Yes (comment)   Comments  -  - SOB, fatigue   Resting HR 96 bpm  - 96 bpm   Resting BP 128/60  - 122/62   Max Ex. HR 116 bpm  - 114 bpm   Max Ex. BP 142/62  - 126/74   2 Minute Post BP 104/64  - 122/70     Interval HR   Baseline HR 96  - 96   1 Minute HR 113  - 114   2 Minute HR 117  - 112   3 Minute HR 114  - 114   4 Minute HR  -  - 106   5 Minute HR 116  - 98   6 Minute HR  -  - 101   2 Minute Post HR 100  - 101   Interval Heart Rate? Yes  - Yes     Interval Oxygen   Interval Oxygen? Yes  - Yes   Baseline Oxygen Saturation % 93 %  - 95 %   Baseline Liters of Oxygen 4 L  - 3 L   1 Minute Oxygen Saturation % 93 %  - 91 %  57 sec 83%, 1:13 91%   1 Minute Liters of Oxygen 4 L  - 4 L   2 Minute Oxygen Saturation % 88 %  - 87 %   2 Minute Liters of Oxygen 4 L  - 4 L   3 Minute Oxygen Saturation % 93 %  - 85 %   3 Minute Liters of Oxygen 4 L  - 4 L   4 Minute Oxygen Saturation %  -  - 87 %  3:26 81%   4 Minute Liters of Oxygen 4 L  - 4 L   5 Minute Oxygen Saturation % 86 %  - 93 %   5 Minute Liters of Oxygen 4 L  - 4 L   6 Minute Oxygen Saturation %  -  - 87 %  7:00 85%   6 Minute Liters of Oxygen 4 L  - 4 L   2 Minute Post Oxygen Saturation % 100 %  - 90 %   2 Minute Post Liters of Oxygen 4 L  - 4 L  Initial Exercise Prescription:     Initial Exercise Prescription - 03/20/16 1300      Date of Initial Exercise RX and Referring Provider   Date 03/20/16   Referring Provider [name]     Oxygen   Oxygen Continuous   Liters 4     Treadmill   MPH 0.5   Grade 0   Minutes 15  2 then rest and repeat     NuStep   Level 1   Minutes 15  2 then rest and repeat   METs 1     Arm Ergometer   Level 1   Minutes 15  2 min then rest and repeat     T5 Nustep   Level 1   Minutes 15  2 min then rest and repeat   METs 1     Prescription Details   Frequency (times per week) 3    Duration Progress to 30 minutes of continuous aerobic without signs/symptoms of physical distress     Intensity   THRR 40-80% of Max Heartrate 120-144   Ratings of Perceived Exertion 11-13   Perceived Dyspnea 0-4     Progression   Progression Continue to progress workloads to maintain intensity without signs/symptoms of physical distress.     Resistance Training   Training Prescription Yes   Weight 1   Reps 10-15      Perform Capillary Blood Glucose checks as needed.  Exercise Prescription Changes:     Exercise Prescription Changes    Row Name 03/26/16 1300 04/12/16 1100 04/24/16 1500 05/09/16 1600       Exercise Review   Progression  -  - Yes Yes      Response to Exercise   Blood Pressure (Admit) 130/66 124/78 98/60 110/54    Blood Pressure (Exercise) 130/72 130/78 126/70 130/66    Blood Pressure (Exit) 120/88 120/72 100/60 112/72    Heart Rate (Admit) 92 bpm 91 bpm 100 bpm 83 bpm    Heart Rate (Exercise) 117 bpm 117 bpm 131 bpm 113 bpm    Heart Rate (Exit) 88 bpm 86 bpm 96 bpm 92 bpm    Oxygen Saturation (Admit) 94 % 95 % 91 % 92 %    Oxygen Saturation (Exercise) 86 %  moved up to 6L - O2 increased to 90 90 % 88 % 87 %    Oxygen Saturation (Exit) 98 % 98 % 99 % 98 %    Rating of Perceived Exertion (Exercise) _0 Perceived Dyspnea (Exercise) _1 Symptoms  -  - none none    Duration Progress to 30 minutes of continuous aerobic without signs/symptoms of physical distress Progress to 45 minutes of aerobic exercise without signs/symptoms of physical distress Progress to 45 minutes of aerobic exercise without signs/symptoms of physical distress Progress to 45 minutes of aerobic exercise without signs/symptoms of physical distress    Intensity THRR unchanged THRR unchanged THRR unchanged THRR unchanged      Progression   Progression Continue to progress workloads to maintain intensity without signs/symptoms of physical distress. Continue to progress  workloads to maintain intensity without signs/symptoms of physical distress. Continue to progress workloads to maintain intensity without signs/symptoms of physical distress. Continue to progress workloads to maintain intensity without signs/symptoms of physical distress.    Average METs  -  - 11.63 1.9      Resistance Training   Training Prescription Yes  - Yes Yes  Weight 1  - 1 2 lbs    Reps 10-15  - 10-15 10-12      Interval Training   Interval Training No  - No No      Oxygen   Oxygen Continuous Continuous Continuous Continuous    Liters _0 Treadmill   MPH 0.8 0.8 0.8 0.5    Grade 0 0 0 0    Minutes 15  2/2/2 15  6/4/_1 min x3 16  6 min 5 min 5 min      NuStep   Level _2 Minutes 15  5/3/_3 METs 1  - 2.3 2.1      Biostep-RELP   Level  - _4 Minutes  - _5 METs  -  - 1 2       Exercise Comments:     Exercise Comments    Row Name 03/20/16 1344 03/26/16 1325 04/12/16 1130 04/24/16 1553 05/09/16 1601   Exercise Comments Romie Minus uses a wheel chair - she did not seem to have trouble with balance other than being very deconditioned and not being able to walk very far. Jodee did well for her first day of exercise.  Her sats dropped to 86 at 2 L so she uses 6L contiuous during exercise. Arbadella is progressing well with exercise. Grier is now getting 5 min intervals on the treadmill consistently.  She has also moved down to join the class when on the treadmill, not worrying about how she looks.  We will continue to monitor her progression. Rilya is almost half way through the program.  She is doing great.  We wil continue to monitor her progression.   [place] Name 05/11/16 1343           Exercise Comments Reviewed home exercise with pt today.  Pt plans to use pedaler at home for exercise.  Reviewed THR, pulse, RPE, sign and symptoms, and when to call 911 or MD.  Also discussed weather considerations and indoor options.  Pt voiced  understanding.          Discharge Exercise Prescription (Final Exercise Prescription Changes):     Exercise Prescription Changes - 05/09/16 1600      Exercise Review   Progression Yes     Response to Exercise   Blood Pressure (Admit) 110/54   Blood Pressure (Exercise) 130/66   Blood Pressure (Exit) 112/72   Heart Rate (Admit) 83 bpm   Heart Rate (Exercise) 113 bpm   Heart Rate (Exit) 92 bpm   Oxygen Saturation (Admit) 92 %   Oxygen Saturation (Exercise) 87 %   Oxygen Saturation (Exit) 98 %   Rating of Perceived Exertion (Exercise) 13   Perceived Dyspnea (Exercise) 3   Symptoms none   Duration Progress to 45 minutes of aerobic exercise without signs/symptoms of physical distress   Intensity THRR unchanged     Progression   Progression Continue to progress workloads to maintain intensity without signs/symptoms of physical distress.   Average METs 1.9     Resistance Training   Training Prescription Yes   Weight 2 lbs   Reps 10-12     Interval Training   Interval Training No     Oxygen   Oxygen Continuous   Liters 4     Treadmill   MPH 0.5   Grade 0  Minutes 16  6 min 5 min 5 min     NuStep   Level 3   Minutes 15   METs 2.1     Biostep-RELP   Level 2   Minutes 15   METs 2       Nutrition:  Target Goals: Understanding of nutrition guidelines, daily intake of sodium <1559m, cholesterol <2061m calories 30% from fat and 7% or less from saturated fats, daily to have 5 or more servings of fruits and vegetables.  Biometrics:     Pre Biometrics - 03/20/16 1331      Pre Biometrics   Height _0  (1.499 m)   Weight 188 lb 6.4 oz (85.5 kg)   Waist Circumference 44.5 inches   Hip Circumference 51.13 inches   Waist to Hip Ratio 0.87 %   BMI (Calculated) 38.1       Nutrition Therapy Plan and Nutrition Goals:   Nutrition Discharge: Rate Your Plate Scores:   Psychosocial: Target Goals: Acknowledge presence or absence of depression, maximize  coping skills, provide positive support system. Participant is able to verbalize types and ability to use techniques and skills needed for reducing stress and depression.  Initial Review & Psychosocial Screening:     Initial Psych Review & Screening - 03/20/16 1253      Initial Review   Current issues with Current Stress Concerns   Comments JeHeylieports that she had tried to exercise in the Independent gym but her father coded upstairs in the hospital and died. JeMaleyaaid she had not been back to exercise since then. JeAmranaid that she knows she needs a hip surgery but she is not sure when the doctor can do it. We discussed that Pulm Rehab can get her strength up for her upcoming hip surgery.      Family Dynamics   Good Support System? Yes   Concerns Recent loss of significant other   Comments Her father died since she has attended Pulm REhab last     Barriers   Psychosocial barriers to participate in program The patient should benefit from training in stress management and relaxation.     Screening Interventions   Interventions Encouraged to exercise      Quality of Life Scores:     Quality of Life - 03/20/16 1439      Quality of Life Scores   Health/Function Pre 20.93 %   Socioeconomic Pre 21 %   Psych/Spiritual Pre 21 %   Family Pre 21 %   GLOBAL Pre 20.97 %      PHQ-9: Recent Review Flowsheet Data    [facility]   Decreased Interest 0   Down, Depressed, Hopeless 0   PHQ - 2 Score 0   Altered sleeping 0   Tired, decreased energy 0   Change in appetite 0   Feeling bad or failure about yourself  0   Trouble concentrating 0   Moving slowly or fidgety/restless 0   Suicidal thoughts 0   PHQ-9 Score 0      Psychosocial Evaluation and Intervention:     Psychosocial Evaluation - 03/26/16 1235      Psychosocial Evaluation & Interventions   Interventions Encouraged to exercise with the program and follow exercise prescription   Comments  Counselor met with Ms. Six today for initial psychosocial evaluation.  She is a [age]ear ol who suffers from COPD and Pulmonary hypertension.  Ms. ShIrmenas a strong support system with a spouse  of 26 years; several adult children who live closeby and active involvement in her local church.  She had hip replacement of right hip in 2013 and reports scheduled for left hip replacement next month.  Ms. Chauncey Cruel states she sleeps okay with the medications prescribed.  She denies a history of depression or anxiety and states she is typically in a positive mood most of the time.  She states that other than her health she has minimal stress in her life.  Ms. Lynes has goals to increase her stamina and strength while in this program and possibly lose some weight as well.        Psychosocial Re-Evaluation:     Psychosocial Re-Evaluation    [place] Name 05/07/16 1239             Psychosocial Re-Evaluation   Comments Counselor follow up with Ms. S today.  She reports having increased energy and walking a little better since coming in to this program.  She is now sleeping better as well and is no longer on the medications prescribed earlier.  Ms. Chauncey Cruel states her mood continues to be stable and positive and she is accomplishing many of her goals she set when she began this program.  Counselor commended her on her progress made.         Education: Education Goals: Education classes will be provided on a weekly basis, covering required topics. Participant will state understanding/return demonstration of topics presented.  Learning Barriers/Preferences:     Learning Barriers/Preferences - 03/20/16 1432      Learning Barriers/Preferences   Learning Barriers None   Learning Preferences Group Instruction;Individual Instruction;Pictoral;Skilled Demonstration;Verbal Instruction;Video;Written Material      Education Topics: Initial Evaluation Education: - Verbal, written and demonstration of respiratory meds,  RPE/PD scales, oximetry and breathing techniques. Instruction on use of nebulizers and MDIs: cleaning and proper use, rinsing mouth with steroid doses and importance of monitoring MDI activations. Flowsheet Row Pulmonary Rehab from 04/27/2016 in [facility] Cardiac and Pulmonary Rehab  Date  03/20/16  Educator  LB  Instruction Review Code  2- meets goals/outcomes      General Nutrition Guidelines/Fats and Fiber: -Group instruction provided by verbal, written material, models and posters to present the general guidelines for heart healthy nutrition. Gives an explanation and review of dietary fats and fiber.   Controlling Sodium/Reading Food Labels: -Group verbal and written material supporting the discussion of sodium use in heart healthy nutrition. Review and explanation with models, verbal and written materials for utilization of the food label.   Exercise Physiology & Risk Factors: - Group verbal and written instruction with models to review the exercise physiology of the cardiovascular system and associated critical values. Details cardiovascular disease risk factors and the goals associated with each risk factor.   Aerobic Exercise & Resistance Training: - Gives group verbal and written discussion on the health impact of inactivity. On the components of aerobic and resistive training programs and the benefits of this training and how to safely progress through these programs. Flowsheet Row Pulmonary Rehab from 04/27/2016 in [facility] Cardiac and Pulmonary Rehab  Date  04/04/16  Educator  JH/AS  Instruction Review Code  2- meets goals/outcomes      Flexibility, Balance, General Exercise Guidelines: - Provides group verbal and written instruction on the benefits of flexibility and balance training programs. Provides general exercise guidelines with specific guidelines to those with heart or lung disease. Demonstration and skill practice provided.   Stress Management: - Provides group verbal  and written instruction about the health risks of elevated stress, cause of high stress, and healthy ways to reduce stress.   Depression: - Provides group verbal and written instruction on the correlation between heart/lung disease and depressed mood, treatment options, and the stigmas associated with seeking treatment.   Exercise & Equipment Safety: - Individual verbal instruction and demonstration of equipment use and safety with use of the equipment. Flowsheet Row Pulmonary Rehab from 04/27/2016 in [facility] Cardiac and Pulmonary Rehab  Date  03/26/16  Educator  AS  Instruction Review Code  2- meets goals/outcomes      Infection Prevention: - Provides verbal and written material to individual with discussion of infection control including proper hand washing and proper equipment cleaning during exercise session. Flowsheet Row Pulmonary Rehab from 04/27/2016 in [facility] Cardiac and Pulmonary Rehab  Date  03/26/16  Educator  AS  Instruction Review Code  2- meets goals/outcomes      Falls Prevention: - Provides verbal and written material to individual with discussion of falls prevention and safety. Flowsheet Row Pulmonary Rehab from 04/27/2016 in [facility] Cardiac and Pulmonary Rehab  Date  03/20/16  Educator  C. Enterkin Therapist, sports  Instruction Review Code  1- partially meets, needs review/practice      Diabetes: - Individual verbal and written instruction to review signs/symptoms of diabetes, desired ranges of glucose level fasting, after meals and with exercise. Advice that pre and post exercise glucose checks will be done for 3 sessions at entry of program.   Chronic Lung Diseases: - Group verbal and written instruction to review new updates, new respiratory medications, new advancements in procedures and treatments. Provide informative websites and "800" numbers of self-education.   Lung Procedures: - Group verbal and written instruction to describe testing methods done to diagnose lung  disease. Review the outcome of test results. Describe the treatment choices: Pulmonary Function Tests, ABGs and oximetry.   Energy Conservation: - Provide group verbal and written instruction for methods to conserve energy, plan and organize activities. Instruct on pacing techniques, use of adaptive equipment and posture/positioning to relieve shortness of breath. Flowsheet Row Pulmonary Rehab from 04/27/2016 in [facility] Cardiac and Pulmonary Rehab  Date  04/25/16  Educator  Fredderick Erb, EP  Instruction Review Code  2- meets goals/outcomes      Triggers: - Group verbal and written instruction to review types of environmental controls: home humidity, furnaces, filters, dust mite/pet prevention, HEPA vacuums. To discuss weather changes, air quality and the benefits of nasal washing. Flowsheet Row Pulmonary Rehab from 04/27/2016 in [facility] Cardiac and Pulmonary Rehab  Date  03/26/16  Educator  LB  Instruction Review Code  2- meets goals/outcomes      Exacerbations: - Group verbal and written instruction to provide: warning signs, infection symptoms, calling MD promptly, preventive modes, and value of vaccinations. Review: effective airway clearance, coughing and/or vibration techniques. Create an Sports administrator.   Oxygen: - Individual and group verbal and written instruction on oxygen therapy. Includes supplement oxygen, available portable oxygen systems, continuous and intermittent flow rates, oxygen safety, concentrators, and Medicare reimbursement for oxygen.   Respiratory Medications: - Group verbal and written instruction to review medications for lung disease. Drug class, frequency, complications, importance of spacers, rinsing mouth after steroid MDI's, and proper cleaning methods for nebulizers.   AED/CPR: - Group verbal and written instruction with the use of models to demonstrate the basic use of the AED with the basic ABC's of resuscitation.   Breathing Retraining: - Provides  individuals  verbal and written instruction on purpose, frequency, and proper technique of diaphragmatic breathing and pursed-lipped breathing. Applies individual practice skills. Flowsheet Row Pulmonary Rehab from 04/27/2016 in [facility] Cardiac and Pulmonary Rehab  Date  03/20/16  Educator  C. EnterkinRN  Instruction Review Code  2- meets Designer, fashion/clothing and Physiology of the Lungs: - Group verbal and written instruction with the use of models to provide basic lung anatomy and physiology related to function, structure and complications of lung disease.   Heart Failure: - Group verbal and written instruction on the basics of heart failure: signs/symptoms, treatments, explanation of ejection fraction, enlarged heart and cardiomyopathy.   Sleep Apnea: - Individual verbal and written instruction to review Obstructive Sleep Apnea. Review of risk factors, methods for diagnosing and types of masks and machines for OSA.   Anxiety: - Provides group, verbal and written instruction on the correlation between heart/lung disease and anxiety, treatment options, and management of anxiety.   Relaxation: - Provides group, verbal and written instruction about the benefits of relaxation for patients with heart/lung disease. Also provides patients with examples of relaxation techniques.   Knowledge Questionnaire Score:     Knowledge Questionnaire Score - 03/20/16 1432      Knowledge Questionnaire Score   Pre Score 9/10       Core Components/Risk Factors/Patient Goals at Admission:     Personal Goals and Risk Factors at Admission - 03/20/16 1438      Core Components/Risk Factors/Patient Goals on Admission   Develop more efficient breathing techniques such as purse lipped breathing and diaphragmatic breathing; and practicing self-pacing with activity Yes   Intervention Provide education, demonstration and support about specific breathing techniuqes utilized for more efficient  breathing. Include techniques such as pursed lipped breathing, diaphragmatic breathing and self-pacing activity.   Expected Outcomes Short Term: Participant will be able to demonstrate and use breathing techniques as needed throughout daily activities.   Increase knowledge of respiratory medications and ability to use respiratory devices properly  Yes   Intervention Provide education and demonstration as needed of appropriate use of medications, inhalers, and oxygen therapy.   Expected Outcomes Short Term: Achieves understanding of medications use. Understands that oxygen is a medication prescribed by physician. Demonstrates appropriate use of inhaler and oxygen therapy.   Hypertension Yes   Intervention Provide education on lifestyle modifcations including regular physical activity/exercise, weight management, moderate sodium restriction and increased consumption of fresh fruit, vegetables, and low fat dairy, alcohol moderation, and smoking cessation.;Monitor prescription use compliance.   Expected Outcomes Short Term: Continued assessment and intervention until BP is < 140/62m HG in hypertensive participants. < 130/849mHG in hypertensive participants with diabetes, heart failure or chronic kidney disease.;Long Term: Maintenance of blood pressure at goal levels.      Core Components/Risk Factors/Patient Goals Review:      Goals and Risk Factor Review    Row Name 03/30/16 1247 04/04/16 1307 04/11/16 1254 04/24/16 0726       Core Components/Risk Factors/Patient Goals Review   Personal Goals Review Sedentary;Increase Strength and Stamina Weight Management/Obesity;Hypertension  - Sedentary;Increase Strength and Stamina;Develop more efficient breathing techniques such as purse lipped breathing and diaphragmatic breathing and practicing self-pacing with activity.;Improve shortness of breath with ADL's;Increase knowledge of respiratory medications and ability to use respiratory devices  properly.;Hypertension    Review Ms. JeErmines not doing much at home.  She is getting here three days a week for exercise.  She felt better after her last  round of rehab, and hopes to do the same this time around. Gladine said she used to weight 129-130 before she quit smoking. Briawna said then she was 150lbs when she started getting sick but now weight is up to 189lbs. Alajia's blood pressure is very good.  Johnica said she doesn't have any problems with her inhalers. Talaysia said she knows that she can not get better but she is trying not to get  worse. Continues to use pursed lip breathing and she is on supplemental oxgyen.  Ms Corle was given a spacer today for her Symbicort and Ventolin. She has a routine for the order she takes her inhalers and this has helped her take her tyvaso. She independently adjusts her oxygen with a higher flow on the treadmill. Ms Wible maintains acceptable BP's and knows the importance of lower BP for her [place]. Her exercise goals have increased , and she is very motivated to be more active , especially for her COPD management.    Expected Outcomes We will go over home exercise with Romie Minus and continue to monitor for improvements in strength and stamina. To lose 2 lbs. Cont. stable blood pressure.  Continued use of her inhalers and pursed lip breathing techniques  -       Core Components/Risk Factors/Patient Goals at Discharge (Final Review):      Goals and Risk Factor Review - 04/24/16 0726      Core Components/Risk Factors/Patient Goals Review   Personal Goals Review Sedentary;Increase Strength and Stamina;Develop more efficient breathing techniques such as purse lipped breathing and diaphragmatic breathing and practicing self-pacing with activity.;Improve shortness of breath with ADL's;Increase knowledge of respiratory medications and ability to use respiratory devices properly.;Hypertension   Review Ms Seres was given a spacer today for her Symbicort and Ventolin. She has a  routine for the order she takes her inhalers and this has helped her take her tyvaso. She independently adjusts her oxygen with a higher flow on the treadmill. Ms Michels maintains acceptable BP's and knows the importance of lower BP for her [place]. Her exercise goals have increased , and she is very motivated to be more active , especially for her COPD management.      ITP Comments:     ITP Comments    Row Name 03/20/16 1251 04/12/16 1130         ITP Comments Timmya reports that she had tried to exercise in the [facility] but her father coded upstairs in the hospital and died. Lynessa said she had not been back to exercise since then. Brenlee said that she knows she needs a hip surgery but she is not sure when the doctor can do it. We discussed that Pulm Rehab can get her strength up for her upcoming hip surgery.  Hser is progressing well with exercise.         Comments: 30 Day Review

## 2016-05-14 NOTE — Progress Notes (Signed)
Daily Session Note  Patient Details  Name: Kristen Montgomery MRN: [id number] Date of Birth: [birth year] Referring Provider:   Flowsheet Row Pulmonary Rehab from 03/20/2016 in [facility]iac and Pul[facility] Rehab  Referring Provider  Merrily Brittle Date: 05/14/2016  Check In:     Session Check In - 05/14/16 1337      Check-In   Location [facility]-Cardiac & Pulmonary Rehab   Staff Present Gerlene Burdock, RN, BSN;Laureen Owens Shark, BS, RRT, Respiratory Therapist;Kelly Amedeo Plenty, BS, ACSM CEP, Exercise Physiologist   Supervising physician immediately available to respond to emergencies [facility] immediately available ER MD   Physician(s) Dr. Corky Downs and Dr. Jimmye Norman   Medication changes reported     No   Fall or balance concerns reported    No   Warm-up and Cool-down Performed as group-led instruction   Resistance Training Performed Yes   VAD Patient? No         Goals Met:  Proper associated with RPD/PD & O2 Sat Exercise tolerated well  Goals Unmet:  Not Applicable  Comments:     Dr. Emily Filbert is Medical Director for [facility] and [facility].

## 2016-05-16 ENCOUNTER — Encounter: Payer: Medicare Other | Attending: Internal Medicine

## 2016-05-16 DIAGNOSIS — J449 Chronic obstructive pulmonary disease, unspecified: Secondary | ICD-10-CM | POA: Insufficient documentation

## 2016-05-16 DIAGNOSIS — I272 Pulmonary hypertension, unspecified: Secondary | ICD-10-CM | POA: Insufficient documentation

## 2016-05-18 ENCOUNTER — Encounter: Payer: Medicare Other | Admitting: *Deleted

## 2016-05-18 DIAGNOSIS — I2721 Secondary pulmonary arterial hypertension: Secondary | ICD-10-CM

## 2016-05-18 DIAGNOSIS — I272 Pulmonary hypertension, unspecified: Secondary | ICD-10-CM | POA: Diagnosis not present

## 2016-05-18 DIAGNOSIS — J449 Chronic obstructive pulmonary disease, unspecified: Secondary | ICD-10-CM

## 2016-05-18 NOTE — Progress Notes (Signed)
Daily Session Note  Patient Details  Name: Kristen Montgomery MRN: [id number] Date of Birth: 03/21/1951 Referring Provider:   April Manson Pulmonary Rehab from 03/20/2016 in [facility] Cardiac and Pulmonary Rehab  Referring Provider  [name]      Encounter Date: 05/18/2016  Check In:     Session Check In - 05/18/16 1248      Check-In   Location [facility]-Cardiac & Pulmonary Rehab   Staff Present Alberteen Sam, MA, ACSM RCEP, Exercise Physiologist;Susanne Bice, RN, BSN, CCRP;Laureen Owens Shark, BS, RRT, Respiratory Therapist;Other   Supervising physician immediately available to respond to emergencies [facility] immediately available ER MD   Physician(s) Drs. McShane & Stafford   Medication changes reported     No   Fall or balance concerns reported    No   Warm-up and Cool-down Performed as group-led Location manager Performed Yes   VAD Patient? No     Pain Assessment   Currently in Pain? No/denies   Multiple Pain Sites No         Goals Met:  Independence with exercise equipment Using PLB without cueing & demonstrates good technique No report of cardiac concerns or symptoms Strength training completed today  Goals Unmet:  O2 Sat  Comments: Pt desaturated on the treadmill to 84% on 4L O2.  Pt was using pursed lip breathing on her own without queing, O2 was turned up to 6L and pt took a rest break.  After interventions, O2 sat improved to 98% on 6L.  On the NuStep, pt self-adjusted her O2 down to 3L.  Pt remained on 3L O2 on NuStep and Biostep with saturations at 94%.     Dr. Emily Filbert is Medical Director for [facility] and [facility].

## 2016-05-21 DIAGNOSIS — J449 Chronic obstructive pulmonary disease, unspecified: Secondary | ICD-10-CM | POA: Diagnosis not present

## 2016-05-21 DIAGNOSIS — I2721 Secondary pulmonary arterial hypertension: Secondary | ICD-10-CM

## 2016-05-21 DIAGNOSIS — I272 Pulmonary hypertension, unspecified: Secondary | ICD-10-CM | POA: Diagnosis not present

## 2016-05-21 NOTE — Progress Notes (Signed)
Daily Session Note  Patient Details  Name: Kristen Montgomery MRN: [id number] Date of Birth: 12/06/1950 Referring Provider:   Flowsheet Row Pulmonary Rehab from 03/20/2016 in [facility] Cardiac and Pulmonary Rehab  Referring Provider  Ford      Encounter Date: 05/21/2016  Check In:     Session Check In - 05/21/16 1312      Check-In   Location [facility] & Pulmonary Rehab   Staff Present Laureen Brown, BS, RRT, Respiratory Therapist;Kelly Hayes, BS, ACSM CEP, Exercise Physiologist;Amanda Sommer, BA, ACSM CEP, Exercise Physiologist   Supervising physician immediately available to respond to emergencies [facility] immediately available ER MD   Physician(s) Williams and [place]   Medication changes reported     No   Warm-up and Cool-down Performed as group-led instruction   Resistance Training Performed Yes   VAD Patient? No     Pain Assessment   Currently in Pain? No/denies   Multiple Pain Sites No         Goals Met:  Proper associated with RPD/PD & O2 Sat Independence with exercise equipment Exercise tolerated well Strength training completed today  Goals Unmet:  Not Applicable  Comments: Pt able to follow exercise prescription today without complaint.  Will continue to monitor for progression.    Dr. Mark Miller is Medical Director for [facility] and [facility]. 

## 2016-05-22 DIAGNOSIS — J449 Chronic obstructive pulmonary disease, unspecified: Secondary | ICD-10-CM | POA: Diagnosis not present

## 2016-05-23 ENCOUNTER — Encounter: Payer: Medicare Other | Admitting: *Deleted

## 2016-05-23 DIAGNOSIS — J449 Chronic obstructive pulmonary disease, unspecified: Secondary | ICD-10-CM | POA: Diagnosis not present

## 2016-05-23 DIAGNOSIS — I272 Pulmonary hypertension, unspecified: Secondary | ICD-10-CM | POA: Diagnosis not present

## 2016-05-23 DIAGNOSIS — I2721 Secondary pulmonary arterial hypertension: Secondary | ICD-10-CM

## 2016-05-23 NOTE — Progress Notes (Signed)
Daily Session Note  Patient Details  Name: Kristen Montgomery MRN: [id number] Date of Birth: Oct 15, 1950 Referring Provider:   Flowsheet Row Pulmonary Rehab from 03/20/2016 in [facility] Cardiac and Pulmonary Rehab  Referring Provider  [name]      Encounter Date: 05/23/2016  Check In:     Session Check In - 05/23/16 1209      Check-In   Location [facility] & Pulmonary Rehab   Staff Present Heath Lark, RN, BSN, CCRP;Tiajuana Leppanen, RN, Vickki Hearing, BA, ACSM CEP, Exercise Physiologist   Supervising physician immediately available to respond to emergencies [facility] immediately available ER MD   Physician(s) Dr. Ciro Backer and Burlene Arnt   Medication changes reported     No   Fall or balance concerns reported    No   Warm-up and Cool-down Performed as group-led instruction   Resistance Training Performed Yes   VAD Patient? No     Pain Assessment   Currently in Pain? No/denies         Goals Met:  Proper associated with RPD/PD & O2 Sat Exercise tolerated well  Goals Unmet:  Not Applicable  Comments:     Dr. Emily Filbert is Medical Director for [facility] and [facility].

## 2016-05-25 ENCOUNTER — Encounter: Payer: Medicare Other | Admitting: *Deleted

## 2016-05-25 DIAGNOSIS — J449 Chronic obstructive pulmonary disease, unspecified: Secondary | ICD-10-CM | POA: Diagnosis not present

## 2016-05-25 DIAGNOSIS — I272 Pulmonary hypertension, unspecified: Secondary | ICD-10-CM | POA: Diagnosis not present

## 2016-05-25 DIAGNOSIS — I2721 Secondary pulmonary arterial hypertension: Secondary | ICD-10-CM

## 2016-05-25 NOTE — Progress Notes (Signed)
Daily Session Note  Patient Details  Name: Kristen Montgomery MRN: [id number] Date of Birth: September 16, 1950 Referring Provider:   Flowsheet Row Pulmonary Rehab from 03/20/2016 in [facility] Cardiac and Pulmonary Rehab  Referring Provider  [name]      Encounter Date: 05/25/2016  Check In:     Session Check In - 05/25/16 1141      Check-In   Location [facility]-Cardiac & Pulmonary Rehab   Staff Present Alberteen Sam, MA, ACSM RCEP, Exercise Physiologist;Susanne Bice, RN, BSN, CCRP;Carroll Enterkin, RN, BSN   Supervising physician immediately available to respond to emergencies [facility] immediately available ER MD   Physician(s) Dr. Archie Balboa and Dr. Clearnce Hasten   Medication changes reported     No   Fall or balance concerns reported    No   Warm-up and Cool-down Performed as group-led instruction   Resistance Training Performed Yes   VAD Patient? No     Pain Assessment   Currently in Pain? No/denies   Multiple Pain Sites No         Goals Met:  Proper associated with RPD/PD & O2 Sat Independence with exercise equipment Using PLB without cueing & demonstrates good technique Exercise tolerated well Strength training completed today  Goals Unmet:  Not Applicable  Comments: Pt able to follow exercise prescription today without complaint.  Will continue to monitor for progression.    Dr. Emily Filbert is Medical Director for [name] and [facility].

## 2016-05-30 ENCOUNTER — Encounter: Payer: Medicare Other | Admitting: *Deleted

## 2016-05-30 DIAGNOSIS — I2721 Secondary pulmonary arterial hypertension: Secondary | ICD-10-CM

## 2016-05-30 DIAGNOSIS — J449 Chronic obstructive pulmonary disease, unspecified: Secondary | ICD-10-CM

## 2016-05-30 DIAGNOSIS — I272 Pulmonary hypertension, unspecified: Secondary | ICD-10-CM | POA: Diagnosis not present

## 2016-05-30 NOTE — Progress Notes (Signed)
Daily Session Note  Patient Details  Name: Kristen Montgomery MRN: [id number] Date of Birth: 09-26-1950 Referring Provider:   April Manson Pulmonary Rehab from 03/20/2016 in [facility] Cardiac and Pulmonary Rehab  Referring Provider  [name]      Encounter Date: 05/30/2016  Check In:     Session Check In - 05/30/16 1152      Check-In   Location [facility]-Cardiac & Pulmonary Rehab   Staff Present Carson Myrtle, BS, RRT, Respiratory Lennie Hummer, MA, ACSM RCEP, Exercise Physiologist;Other;Amanda Oletta Darter, BA, ACSM CEP, Exercise Physiologist   Supervising physician immediately available to respond to emergencies [facility] immediately available ER MD   Physician(s) Drs. Alfred Levins and [place]   Medication changes reported     No   Fall or balance concerns reported    No   Warm-up and Cool-down Performed as group-led Location manager Performed Yes   VAD Patient? No     Pain Assessment   Currently in Pain? No/denies   Multiple Pain Sites No        Goals Met:  Proper associated with RPD/PD & O2 Sat Independence with exercise equipment Using PLB without cueing & demonstrates good technique Exercise tolerated well No report of cardiac concerns or symptoms Strength training completed today  Goals Unmet:  Not Applicable  Comments: Pt able to follow exercise prescription today without complaint.  Will continue to monitor for progression.    Dr. Emily Filbert is Medical Director for [place] and [facility].

## 2016-06-01 DIAGNOSIS — I2721 Secondary pulmonary arterial hypertension: Secondary | ICD-10-CM | POA: Diagnosis not present

## 2016-06-01 DIAGNOSIS — J449 Chronic obstructive pulmonary disease, unspecified: Secondary | ICD-10-CM | POA: Diagnosis not present

## 2016-06-01 DIAGNOSIS — R0602 Shortness of breath: Secondary | ICD-10-CM | POA: Diagnosis not present

## 2016-06-06 ENCOUNTER — Encounter: Payer: Self-pay | Admitting: Emergency Medicine

## 2016-06-06 NOTE — Progress Notes (Signed)
Kristen Montgomery psychosocial assessment reveals no barriers at this time to participation in Pulmonary Rehab.  she has good family and friend support that encourages Kristen Montgomery to participate in [place] and progress with Her goals.  Kristen Montgomery concerns are monitored, but she has acknowledge that attending the program has helped to maintain quality life with improved mobility, self-care, and emotional and financial stability.  Kristen Montgomery is commended for regular attendance and self-motivation to improve Her pulmonary disease management.

## 2016-06-06 NOTE — Progress Notes (Signed)
Kristen Montgomery psychosocial assessment reveals no barriers at this time to participation in Pulmonary Rehab.  she has good family and friend support that encourages Kristen Montgomery to participate in [place] and progress with Her goals.  Kristen Montgomery concerns are monitored, but she has acknowledge that attending the program has helped to maintain quality life with improved mobility, self-care, and emotional and financial stability.  Kristen Montgomery is commended for regular attendance and self-motivation to improve her pulmonary disease management.

## 2016-06-10 DIAGNOSIS — I270 Primary pulmonary hypertension: Secondary | ICD-10-CM | POA: Diagnosis not present

## 2016-06-11 ENCOUNTER — Encounter: Payer: Self-pay | Admitting: *Deleted

## 2016-06-11 DIAGNOSIS — J449 Chronic obstructive pulmonary disease, unspecified: Secondary | ICD-10-CM

## 2016-06-11 DIAGNOSIS — I2721 Secondary pulmonary arterial hypertension: Secondary | ICD-10-CM

## 2016-06-11 NOTE — Progress Notes (Signed)
Pulmonary Individual Treatment Plan  Patient Details  Name: Kristen Montgomery MRN: [id number] Date of Birth: [birth year] Referring Provider:   Flowsheet Row Pulmonary Rehab from 03/20/2016 in [facility] Cardiac and Pulmonary Rehab  Referring Provider  [name]      Initial Encounter Date:  Flowsheet Row Pulmonary Rehab from 03/20/2016 in [facility] Cardiac and Pulmonary Rehab  Date  03/20/16  Referring Provider  [name]      Visit Diagnosis: PAH (pulmonary artery hypertension)  COPD, severe ([place])  Patient's Home Medications on Admission:  Current Outpatient Prescriptions:  .  albuterol (ACCUNEB) 0.63 MG/3ML nebulizer solution, Inhale 1 ampule by nebulization every six (6) hours as needed for wheezing., Disp: , Rfl:  .  albuterol (VENTOLIN HFA) 108 (90 Base) MCG/ACT inhaler, INHALE 2 PUFFS BY MOUTH EVERY 4 TO 6 HOURS AS NEEDED FOR SHORTNESS OF BREATH, COUGH OR WHEEZE, Disp: , Rfl:  .  aspirin EC 81 MG tablet, Take by mouth., Disp: , Rfl:  .  carvedilol (COREG) 3.125 MG tablet, Take 3.125 mg by mouth., Disp: , Rfl:  .  docusate sodium (COLACE) 100 MG capsule, Take 100 mg by mouth., Disp: , Rfl:  .  furosemide (LASIX) 40 MG tablet, Take 40 mg by mouth., Disp: , Rfl:  .  losartan (COZAAR) 50 MG tablet, Take 50 mg by mouth 1 day or 1 dose., Disp: , Rfl:  .  omeprazole (PRILOSEC) 20 MG capsule, Take 20 mg by mouth., Disp: , Rfl:  .  potassium chloride SA (K-DUR,KLOR-CON) 20 MEQ tablet, Take 10 mEq by mouth., Disp: , Rfl:  .  tiotropium (SPIRIVA HANDIHALER) 18 MCG inhalation capsule, INHALE 1 CAPSULE VIA HANDIHALER ONCE DAILY AT THE SAME TIME EVERY DAY, Disp: , Rfl:  .  traZODone (DESYREL) 50 MG tablet, TAKE 1 TABLET BY MOUTH AT BEDTIME AS NEEDED, Disp: , Rfl:  .  Treprostinil (TYVASO) 0.6 MG/ML SOLN, Inhale 3-9 breaths four times daily., Disp: , Rfl:   Past Medical History: No past medical history on file.  Tobacco Use: History  Smoking Status  . Not on file  Smokeless Tobacco  . Not on file     Labs: Recent Review Flowsheet Data    There is no flowsheet data to display.       ADL UCSD:     Pulmonary Assessment Scores    Row Name 03/20/16 1435 05/11/16 1324       ADL UCSD   ADL Phase Entry Mid    SOB Score total 68 74    Rest 0 0    Walk 2 4    Stairs 4 4    Bath 2 3    Dress 3 3    Shop 3 0       Pulmonary Function Assessment:     Pulmonary Function Assessment - 03/20/16 1433      Initial Spirometry Results   FVC% 31 %   FEV1% 27 %   FEV1/FVC Ratio 68.6   Comments Test date 03/20/2016      Post Bronchodilator Spirometry Results   FVC% 46 %   FEV1% 29 %   FEV1/FVC Ratio 62.47     Breath   Bilateral Breath Sounds Decreased   Shortness of Breath Yes;Fear of Shortness of Breath;Limiting activity      Exercise Target Goals:    Exercise Program Goal: Individual exercise prescription set with THRR, safety & activity barriers. Participant demonstrates ability to understand and report RPE using BORG scale, to self-measure pulse accurately, and to  acknowledge the importance of the exercise prescription.  Exercise Prescription Goal: Starting with aerobic activity 30 plus minutes a day, 3 days per week for initial exercise prescription. Provide home exercise prescription and guidelines that participant acknowledges understanding prior to discharge.  Activity Barriers & Risk Stratification:     Activity Barriers & Cardiac Risk Stratification - 03/20/16 1432      Activity Barriers & Cardiac Risk Stratification   Activity Barriers Shortness of Breath;Deconditioning   Cardiac Risk Stratification Moderate      6 Minute Walk:     6 Minute Walk    Row Name 03/20/16 1332 05/11/16 1331 05/11/16 1336     6 Minute Walk   Phase  - Mid Program Mid Program   Distance 340 feet  - 270 feet   Distance % Change  -  - -20.6 %   Walk Time 3 minutes  - 2.02 minutes   # of Rest Breaks 3  - 3  1:21, 20 sec, 2:18   MPH 1.28  - 1.52   METS 1.17  - 2.2   RPE  13  - 17   Perceived Dyspnea  4  - 4   VO2 Peak 4.11  - 2.95   Symptoms No  - Yes (comment)   Comments  -  - SOB, fatigue   Resting HR 96 bpm  - 96 bpm   Resting BP 128/60  - 122/62   Max Ex. HR 116 bpm  - 114 bpm   Max Ex. BP 142/62  - 126/74   2 Minute Post BP 104/64  - 122/70     Interval HR   Baseline HR 96  - 96   1 Minute HR 113  - 114   2 Minute HR 117  - 112   3 Minute HR 114  - 114   4 Minute HR  -  - 106   5 Minute HR 116  - 98   6 Minute HR  -  - 101   2 Minute Post HR 100  - 101   Interval Heart Rate? Yes  - Yes     Interval Oxygen   Interval Oxygen? Yes  - Yes   Baseline Oxygen Saturation % 93 %  - 95 %   Baseline Liters of Oxygen 4 L  - 3 L   1 Minute Oxygen Saturation % 93 %  - 91 %  57 sec 83%, 1:13 91%   1 Minute Liters of Oxygen 4 L  - 4 L   2 Minute Oxygen Saturation % 88 %  - 87 %   2 Minute Liters of Oxygen 4 L  - 4 L   3 Minute Oxygen Saturation % 93 %  - 85 %   3 Minute Liters of Oxygen 4 L  - 4 L   4 Minute Oxygen Saturation %  -  - 87 %  3:26 81%   4 Minute Liters of Oxygen 4 L  - 4 L   5 Minute Oxygen Saturation % 86 %  - 93 %   5 Minute Liters of Oxygen 4 L  - 4 L   6 Minute Oxygen Saturation %  -  - 87 %  7:00 85%   6 Minute Liters of Oxygen 4 L  - 4 L   2 Minute Post Oxygen Saturation % 100 %  - 90 %   2 Minute Post Liters of Oxygen 4 L  - 4 L  Initial Exercise Prescription:     Initial Exercise Prescription - 03/20/16 1300      Date of Initial Exercise RX and Referring Provider   Date 03/20/16   Referring Provider [name]     Oxygen   Oxygen Continuous   Liters 4     Treadmill   MPH 0.5   Grade 0   Minutes 15  2 then rest and repeat     NuStep   Level 1   Minutes 15  2 then rest and repeat   METs 1     Arm Ergometer   Level 1   Minutes 15  2 min then rest and repeat     T5 Nustep   Level 1   Minutes 15  2 min then rest and repeat   METs 1     Prescription Details   Frequency (times per week) 3    Duration Progress to 30 minutes of continuous aerobic without signs/symptoms of physical distress     Intensity   THRR 40-80% of Max Heartrate 120-144   Ratings of Perceived Exertion 11-13   Perceived Dyspnea 0-4     Progression   Progression Continue to progress workloads to maintain intensity without signs/symptoms of physical distress.     Resistance Training   Training Prescription Yes   Weight 1   Reps 10-15      Perform Capillary Blood Glucose checks as needed.  Exercise Prescription Changes:     Exercise Prescription Changes    Row Name 03/26/16 1300 04/12/16 1100 04/24/16 1500 05/09/16 1600 05/23/16 1500     Exercise Review   Progression  -  - Yes Yes Yes     Response to Exercise   Blood Pressure (Admit) 130/66 124/78 98/60 110/54 142/70   Blood Pressure (Exercise) 130/72 130/78 126/70 130/66 146/74   Blood Pressure (Exit) 120/88 120/72 100/60 112/72 122/78   Heart Rate (Admit) 92 bpm 91 bpm 100 bpm 83 bpm 89 bpm   Heart Rate (Exercise) 117 bpm 117 bpm 131 bpm 113 bpm 117 bpm   Heart Rate (Exit) 88 bpm 86 bpm 96 bpm 92 bpm 95 bpm   Oxygen Saturation (Admit) 94 % 95 % 91 % 92 % 92 %   Oxygen Saturation (Exercise) 86 %  moved up to 6L - O2 increased to 90 90 % 88 % 87 % 89 %   Oxygen Saturation (Exit) 98 % 98 % 99 % 98 % 97 %   Rating of Perceived Exertion (Exercise) _0 Perceived Dyspnea (Exercise) _1 Symptoms  -  - none none none   Comments  -  -  -  - Home Exercise Guidelines given 05/11/16   Duration Progress to 30 minutes of continuous aerobic without signs/symptoms of physical distress Progress to 45 minutes of aerobic exercise without signs/symptoms of physical distress Progress to 45 minutes of aerobic exercise without signs/symptoms of physical distress Progress to 45 minutes of aerobic exercise without signs/symptoms of physical distress Progress to 45 minutes of aerobic exercise without signs/symptoms of physical distress   Intensity  _2      Progression   Progression Continue to progress workloads to maintain intensity without signs/symptoms of physical distress. Continue to progress workloads to maintain intensity without signs/symptoms of physical distress. Continue to progress workloads to maintain intensity without signs/symptoms of physical distress. Continue to progress workloads to maintain intensity  without signs/symptoms of physical distress. Continue to progress workloads to maintain intensity without signs/symptoms of physical distress.   Average METs  -  - 11.63 1.9 1.67     Resistance Training   Training Prescription Yes  - Yes Yes Yes   Weight 1  - 1 2 lbs 2 lbs   Reps 10-15  - 10-15 10-12 10-12     Interval Training   Interval Training No  - No No No     Oxygen   Oxygen _0    Liters _1 Treadmill   MPH 0.8 0.8 0.8 0.5 0.8   Grade 0 0 0 0 0   Minutes 15  2/2/2 15  6/4/_2 min x3 16  6 min 5 min 5 min 15     NuStep   Level _3 Minutes 15  5/3/_4 METs 1  - 2.3 2.1 2.4     Biostep-RELP   Level  - _5 Minutes  - _6 METs  -  - _7 Home Exercise Plan   Plans to continue exercise at  -  -  -  - Home  walking, chair exercises   Frequency  -  -  -  - Add 2 additional days to program exercise sessions.   Row Name 06/05/16 1600             Exercise Review   Progression Yes         Response to Exercise   Blood Pressure (Admit) 142/62       Blood Pressure (Exercise) 126/74       Blood Pressure (Exit) 112/60       Heart Rate (Admit) 89 bpm       Heart Rate (Exercise) 115 bpm       Heart Rate (Exit) 99 bpm       Oxygen Saturation (Admit) 96 %       Oxygen Saturation (Exercise) 90 %       Oxygen Saturation (Exit) 96 %       Rating of Perceived Exertion (Exercise) 13       Perceived Dyspnea (Exercise) 7        Symptoms none       Comments Home Exercise Guidelines given 05/11/16       Duration Progress to 45 minutes of aerobic exercise without signs/symptoms of physical distress       Intensity THRR unchanged         Progression   Progression Continue to progress workloads to maintain intensity without signs/symptoms of physical distress.       Average METs 1.83         Resistance Training   Training Prescription Yes       Weight 2 lbs       Reps 10-12         Interval Training   Interval Training No         Oxygen   Oxygen Continuous       Liters 4         Treadmill   MPH 0.8       Grade 0       Minutes _8 NuStep   Level 3  Minutes 15       METs 1.8         Biostep-RELP   Level 2       Minutes 15       METs 2         Home Exercise Plan   Plans to continue exercise at Home  walking, chair exercises       Frequency Add 2 additional days to program exercise sessions.          Exercise Comments:     Exercise Comments    Row Name 03/20/16 1344 03/26/16 1325 04/12/16 1130 04/24/16 1553 05/09/16 1601   Exercise Comments May uses a wheel chair - she did not seem to have trouble with balance other than being very deconditioned and not being able to walk very far. Kylah did well for her first day of exercise.  Her sats dropped to 86 at 2 L so she uses 6L contiuous during exercise. [place] is progressing well with exercise. Lerlene is now getting 5 min intervals on the treadmill consistently.  She has also moved down to join the class when on the treadmill, not worrying about how she looks.  We will continue to monitor her progression. Chasady is almost half way through the program.  She is doing great.  We wil continue to monitor her progression.   [place] Name 05/11/16 1343 05/18/16 1254 05/23/16 1405 06/05/16 1634     Exercise Comments Reviewed home exercise with pt today.  Pt plans to use pedaler at home for exercise.  Reviewed THR, pulse, RPE, sign and symptoms, and when  to call 911 or MD.  Also discussed weather considerations and indoor options.  Pt voiced understanding. Pt desaturated on the treadmill to 84% on 4L O2.  Pt was using pursed lip breathing on her own without queing, O2 was turned up to 6L and pt took a rest break.  After interventions, O2 sat improved to 98% on 6L.  On the NuStep, pt self-adjusted her O2 down to 3L.  Pt remained on 3L O2 on NuStep and Biostep with saturations at 94%.  Jordayn continues to come to rehab for exercise.  It gets her out of the house and moving.  She is doing some on her own at home.  We will continue to encourage her to come to rehab and to exercise at home. Leeah has been doing well in rehab.  She is getting 10 min straight on the treadmill!!! We will continue to monitor her progression.       Discharge Exercise Prescription (Final Exercise Prescription Changes):     Exercise Prescription Changes - 06/05/16 1600      Exercise Review   Progression Yes     Response to Exercise   Blood Pressure (Admit) 142/62   Blood Pressure (Exercise) 126/74   Blood Pressure (Exit) 112/60   Heart Rate (Admit) 89 bpm   Heart Rate (Exercise) 115 bpm   Heart Rate (Exit) 99 bpm   Oxygen Saturation (Admit) 96 %   Oxygen Saturation (Exercise) 90 %   Oxygen Saturation (Exit) 96 %   Rating of Perceived Exertion (Exercise) 13   Perceived Dyspnea (Exercise) 7   Symptoms none   Comments Home Exercise Guidelines given 05/11/16   Duration Progress to 45 minutes of aerobic exercise without signs/symptoms of physical distress   Intensity THRR unchanged     Progression   Progression Continue to progress workloads to maintain intensity without signs/symptoms of physical distress.  Average METs 1.83     Resistance Training   Training Prescription Yes   Weight 2 lbs   Reps 10-12     Interval Training   Interval Training No     Oxygen   Oxygen Continuous   Liters 4     Treadmill   MPH 0.8   Grade 0   Minutes _0 NuStep   Level 3   Minutes 15   METs 1.8     Biostep-RELP   Level 2   Minutes 15   METs 2     Home Exercise Plan   Plans to continue exercise at Home  walking, chair exercises   Frequency Add 2 additional days to program exercise sessions.       Nutrition:  Target Goals: Understanding of nutrition guidelines, daily intake of sodium <1534m, cholesterol <2043m calories 30% from fat and 7% or less from saturated fats, daily to have 5 or more servings of fruits and vegetables.  Biometrics:     Pre Biometrics - 03/20/16 1331      Pre Biometrics   Height 4' 11" (1.499 m)   Weight 188 lb 6.4 oz (85.5 kg)   Waist Circumference 44.5 inches   Hip Circumference 51.13 inches   Waist to Hip Ratio 0.87 %   BMI (Calculated) 38.1       Nutrition Therapy Plan and Nutrition Goals:   Nutrition Discharge: Rate Your Plate Scores:   Psychosocial: Target Goals: Acknowledge presence or absence of depression, maximize coping skills, provide positive support system. Participant is able to verbalize types and ability to use techniques and skills needed for reducing stress and depression.  Initial Review & Psychosocial Screening:     Initial Psych Review & Screening - 03/20/16 1253      Initial Review   Current issues with Current Stress Concerns   Comments JeVerbleeports that she had tried to exercise in the [facility] gym but her father coded upstairs in the hospital and died. JeAvaryaid she had not been back to exercise since then. JeLeraaid that she knows she needs a hip surgery but she is not sure when the doctor can do it. We discussed that Pulm Rehab can get her strength up for her upcoming hip surgery.      Family Dynamics   Good Support System? Yes   Concerns Recent loss of significant other   Comments Her father died since she has attended Pulm REhab last     Barriers   Psychosocial barriers to participate in program The patient should benefit from training in stress  management and relaxation.     Screening Interventions   Interventions Encouraged to exercise      Quality of Life Scores:     Quality of Life - 03/20/16 1439      Quality of Life Scores   Health/Function Pre 20.93 %   Socioeconomic Pre 21 %   Psych/Spiritual Pre 21 %   Family Pre 21 %   GLOBAL Pre 20.97 %      PHQ-9: Recent Review Flowsheet Data    [facility]   Decreased Interest 0   Down, Depressed, Hopeless 0   PHQ - 2 Score 0   Altered sleeping 0   Tired, decreased energy 0   Change in appetite 0   Feeling bad or failure about yourself  0   Trouble concentrating 0   Moving slowly or fidgety/restless 0  Suicidal thoughts 0   PHQ-9 Score 0      Psychosocial Evaluation and Intervention:     Psychosocial Evaluation - 03/26/16 1235      Psychosocial Evaluation & Interventions   Interventions Encouraged to exercise with the program and follow exercise prescription   Comments Counselor met with Ms. Vangorder today for initial psychosocial evaluation.  She is a 76 year ol who suffers from COPD and Pulmonary hypertension.  Ms. Mcavoy has a strong support system with a spouse of 26 years; several adult children who live closeby and active involvement in her local church.  She had hip replacement of right hip in 2013 and reports scheduled for left hip replacement next month.  Ms. Chauncey Cruel states she sleeps okay with the medications prescribed.  She denies a history of depression or anxiety and states she is typically in a positive mood most of the time.  She states that other than her health she has minimal stress in her life.  Ms. Hege has goals to increase her stamina and strength while in this program and possibly lose some weight as well.        Psychosocial Re-Evaluation:     Psychosocial Re-Evaluation    [place] Name 05/07/16 1239 06/06/16 1236 06/06/16 1340         Psychosocial Re-Evaluation   Comments Counselor follow up with Ms. S today.   She reports having increased energy and walking a little better since coming in to this program.  She is now sleeping better as well and is no longer on the medications prescribed earlier.  Ms. Chauncey Cruel states her mood continues to be stable and positive and she is accomplishing many of her goals she set when she began this program.  Counselor commended her on her progress made. Romie Minus psychosocial assessment reveals no barriers at this time to participation in Pulmonary Rehab.  she has good family and friend support that encourages Jermisha to participate in [place] and progress with Her goals.  Myron concerns are monitored, but she has acknowledge that attending the program has helped to maintain quality life with improved mobility, self-care, and emotional and financial stability.  Jesilyn is commended for regular attendance and self-motivation to improve her pulmonary disease management. Romie Minus psychosocial assessment reveals no barriers at this time to participation in Pulmonary Rehab.  she has good family and friend support that encourages Jeanita to participate in [place] and progress with Her goals.  Tomasa concerns are monitored, but she has acknowledge that attending the program has helped to maintain quality life with improved mobility, self-care, and emotional and financial stability.  Yesly is commended for regular attendance and self-motivation to improve Her pulmonary disease management.       Education: Education Goals: Education classes will be provided on a weekly basis, covering required topics. Participant will state understanding/return demonstration of topics presented.  Learning Barriers/Preferences:     Learning Barriers/Preferences - 03/20/16 1432      Learning Barriers/Preferences   Learning Barriers None   Learning Preferences Group Instruction;Individual Instruction;Pictoral;Skilled Demonstration;Verbal Instruction;Video;Written Material      Education Topics: Initial Evaluation  Education: - Verbal, written and demonstration of respiratory meds, RPE/PD scales, oximetry and breathing techniques. Instruction on use of nebulizers and MDIs: cleaning and proper use, rinsing mouth with steroid doses and importance of monitoring MDI activations. Flowsheet Row Pulmonary Rehab from 05/30/2016 in [facility] Cardiac and Pulmonary Rehab  Date  03/20/16  Educator  LB  Instruction Review Code  2- meets goals/outcomes  General Nutrition Guidelines/Fats and Fiber: -Group instruction provided by verbal, written material, models and posters to present the general guidelines for heart healthy nutrition. Gives an explanation and review of dietary fats and fiber.   Controlling Sodium/Reading Food Labels: -Group verbal and written material supporting the discussion of sodium use in heart healthy nutrition. Review and explanation with models, verbal and written materials for utilization of the food label.   Exercise Physiology & Risk Factors: - Group verbal and written instruction with models to review the exercise physiology of the cardiovascular system and associated critical values. Details cardiovascular disease risk factors and the goals associated with each risk factor. Flowsheet Row Pulmonary Rehab from 05/30/2016 in [facility] Cardiac and Pulmonary Rehab  Date  05/30/16  Educator  AS  Instruction Review Code  2- meets goals/outcomes      Aerobic Exercise & Resistance Training: - Gives group verbal and written discussion on the health impact of inactivity. On the components of aerobic and resistive training programs and the benefits of this training and how to safely progress through these programs. Flowsheet Row Pulmonary Rehab from 05/30/2016 in [facility] Cardiac and Pulmonary Rehab  Date  04/04/16  Educator  JH/AS  Instruction Review Code  2- meets goals/outcomes      Flexibility, Balance, General Exercise Guidelines: - Provides group verbal and written instruction on the benefits  of flexibility and balance training programs. Provides general exercise guidelines with specific guidelines to those with heart or lung disease. Demonstration and skill practice provided.   Stress Management: - Provides group verbal and written instruction about the health risks of elevated stress, cause of high stress, and healthy ways to reduce stress.   Depression: - Provides group verbal and written instruction on the correlation between heart/lung disease and depressed mood, treatment options, and the stigmas associated with seeking treatment.   Exercise & Equipment Safety: - Individual verbal instruction and demonstration of equipment use and safety with use of the equipment. Flowsheet Row Pulmonary Rehab from 05/30/2016 in [facility] Cardiac and Pulmonary Rehab  Date  03/26/16  Educator  AS  Instruction Review Code  2- meets goals/outcomes      Infection Prevention: - Provides verbal and written material to individual with discussion of infection control including proper hand washing and proper equipment cleaning during exercise session. Flowsheet Row Pulmonary Rehab from 05/30/2016 in [facility] Cardiac and Pulmonary Rehab  Date  03/26/16  Educator  AS  Instruction Review Code  2- meets goals/outcomes      Falls Prevention: - Provides verbal and written material to individual with discussion of falls prevention and safety. Flowsheet Row Pulmonary Rehab from 05/30/2016 in [facility] Cardiac and Pulmonary Rehab  Date  03/20/16  Educator  C. Enterkin Therapist, sports  Instruction Review Code  1- partially meets, needs review/practice      Diabetes: - Individual verbal and written instruction to review signs/symptoms of diabetes, desired ranges of glucose level fasting, after meals and with exercise. Advice that pre and post exercise glucose checks will be done for 3 sessions at entry of program.   Chronic Lung Diseases: - Group verbal and written instruction to review new updates, new respiratory  medications, new advancements in procedures and treatments. Provide informative websites and "800" numbers of self-education.   Lung Procedures: - Group verbal and written instruction to describe testing methods done to diagnose lung disease. Review the outcome of test results. Describe the treatment choices: Pulmonary Function Tests, ABGs and oximetry.   Energy Conservation: - Provide group verbal  and written instruction for methods to conserve energy, plan and organize activities. Instruct on pacing techniques, use of adaptive equipment and posture/positioning to relieve shortness of breath. Flowsheet Row Pulmonary Rehab from 05/30/2016 in [facility] Cardiac and Pulmonary Rehab  Date  04/25/16  Educator  Fredderick Erb, EP  Instruction Review Code  2- meets goals/outcomes      Triggers: - Group verbal and written instruction to review types of environmental controls: home humidity, furnaces, filters, dust mite/pet prevention, HEPA vacuums. To discuss weather changes, air quality and the benefits of nasal washing. Flowsheet Row Pulmonary Rehab [facility] 05[facility]30/2016 in [facility] [facility] Cardiac and Pulmonary Rehab  Date  03/26/16  Educator  LB  Instruction Review Code  2- meets goals/outcomes      Exacerbations: - Group verbal and written instruction to provide: warning signs, infection symptoms, calling MD promptly, preventive modes, and value of vaccinations. Review: effective airway clearance, coughing and/or vibration techniques. Create an Sports administrator.   Oxygen: - Individual and group verbal and written instruction on oxygen therapy. Includes supplement oxygen, available portable oxygen systems, continuous and intermittent flow rates, oxygen safety, concentrators, and Medicare reimbursement for oxygen.   Respiratory Medications: - Group verbal and written instruction to review medications for lung disease. Drug class, frequency, complications, importance of spacers, rinsing mouth after steroid MDI's, and  proper cleaning methods for nebulizers.   AED/CPR: - Group verbal and written instruction with the use of models to demonstrate the basic use of the AED with the basic ABC's of resuscitation. Flowsheet Row Pulmonary Rehab from 05/30/2016 in [facility] Cardiac and Pulmonary Rehab  Date  05/25/16  Educator  CE  Instruction Review Code  2- meets goals/outcomes      Breathing Retraining: - Provides individuals verbal and written instruction on purpose, frequency, and proper technique of diaphragmatic breathing and pursed-lipped breathing. Applies individual practice skills. Flowsheet Row Pulmonary Rehab from 05/30/2016 in [facility] Cardiac and Pulmonary Rehab  Date  03/20/16  Educator  C. EnterkinRN  Instruction Review Code  2- meets Designer, fashion/clothing and Physiology of the Lungs: - Group verbal and written instruction with the use of models to provide basic lung anatomy and physiology related to function, structure and complications of lung disease.   Heart Failure: - Group verbal and written instruction on the basics of heart failure: signs/symptoms, treatments, explanation of ejection fraction, enlarged heart and cardiomyopathy.   Sleep Apnea: - Individual verbal and written instruction to review Obstructive Sleep Apnea. Review of risk factors, methods for diagnosing and types of masks and machines for OSA.   Anxiety: - Provides group, verbal and written instruction on the correlation between heart/lung disease and anxiety, treatment options, and management of anxiety.   Relaxation: - Provides group, verbal and written instruction about the benefits of relaxation for patients with heart/lung disease. Also provides patients with examples of relaxation techniques.   Knowledge Questionnaire Score:     Knowledge Questionnaire Score - 03/20/16 1432      Knowledge Questionnaire Score   Pre Score 9/10       Core Components/Risk Factors/Patient Goals at Admission:      Personal Goals and Risk Factors at Admission - 03/20/16 1438      Core Components/Risk Factors/Patient Goals on Admission   Develop more efficient breathing techniques such as purse lipped breathing and diaphragmatic breathing; and practicing self-pacing with activity Yes   Intervention Provide education, demonstration and support about specific breathing techniuqes utilized for more efficient breathing. Include techniques such  as pursed lipped breathing, diaphragmatic breathing and self-pacing activity.   Expected Outcomes Short Term: Participant will be able to demonstrate and use breathing techniques as needed throughout daily activities.   Increase knowledge of respiratory medications and ability to use respiratory devices properly  Yes   Intervention Provide education and demonstration as needed of appropriate use of medications, inhalers, and oxygen therapy.   Expected Outcomes Short Term: Achieves understanding of medications use. Understands that oxygen is a medication prescribed by physician. Demonstrates appropriate use of inhaler and oxygen therapy.   Hypertension Yes   Intervention Provide education on lifestyle modifcations including regular physical activity/exercise, weight management, moderate sodium restriction and increased consumption of fresh fruit, vegetables, and low fat dairy, alcohol moderation, and smoking cessation.;Monitor prescription use compliance.   Expected Outcomes Short Term: Continued assessment and intervention until BP is < 140/60m HG in hypertensive participants. < 130/837mHG in hypertensive participants with diabetes, heart failure or chronic kidney disease.;Long Term: Maintenance of blood pressure at goal levels.      Core Components/Risk Factors/Patient Goals Review:      Goals and Risk Factor Review    Row Name 03/30/16 1247 04/04/16 1307 04/11/16 1254 04/24/16 0726 06/06/16 1333     Core Components/Risk Factors/Patient Goals Review   Personal Goals  Review Sedentary;Increase Strength and Stamina Weight Management/Obesity;Hypertension  - Sedentary;Increase Strength and Stamina;Develop more efficient breathing techniques such as purse lipped breathing and diaphragmatic breathing and practicing self-pacing with activity.;Improve shortness of breath with ADL's;Increase knowledge of respiratory medications and ability to use respiratory devices properly.;Hypertension  -   Review Ms. JeDeedras not doing much at home.  She is getting here three days a week for exercise.  She felt better after her last round of rehab, and hopes to do the same this time around. JeEmmilyaid she used to weight 129-130 before she quit smoking. JeIkiaaid then she was 150lbs when she started getting sick but now weight is up to 189lbs. Kristyna's blood pressure is very good.  JeMorgannaaid she doesn't have any problems with her inhalers. JeKourtniaid she knows that she can not get better but she is trying not to get  worse. Continues to use pursed lip breathing and she is on supplemental oxgyen.  Ms ShBurressas given a spacer today for her Symbicort and Ventolin. She has a routine for the order she takes her inhalers and this has helped her take her tyvaso. She independently adjusts her oxygen with a higher flow on the treadmill. Ms ShWachtelaintains acceptable BP's and knows the importance of lower BP for her [place] exercise goals have increased , and she is very motivated to be more active , especially for her COPD management. Ms ShCastellos progressing with her exercise goals. She is maintaining acceptable BP and adjusts her oxygen accordingly with her different exercise goals. Ms ShLevettas a good understanding of her inhalers and her tyvaso SVN for her PAH.   Expected Outcomes We will go over home exercise with JeRomie Minusnd continue to monitor for improvements in strength and stamina. To lose 2 lbs. Cont. stable blood pressure.  Continued use of her inhalers and pursed lip breathing techniques  -  [place] continue her management of COPD.      Core Components/Risk Factors/Patient Goals at Discharge (Final Review):      Goals and Risk Factor Review - 06/06/16 1333      Core Components/Risk Factors/Patient Goals Review  Review Ms Habermehl is progressing with her exercise goals. She is maintaining acceptable BP and adjusts her oxygen accordingly with her different exercise goals. Ms Castner has a good understanding of her inhalers and her tyvaso SVN for her PAH.   Expected Outcomes Continue exercising regularly in [place] and continue her management of COPD.      ITP Comments:     ITP Comments    Row Name 03/20/16 1251 04/12/16 1130         ITP Comments Shekina reports that she had tried to exercise in the [facility] gym but her father coded upstairs in the hospital and died. Renaye said she had not been back to exercise since then. Ajayla said that she knows she needs a hip surgery but she is not sure when the doctor can do it. We discussed that Pulm Rehab can get her strength up for her upcoming hip surgery.  Vinia is progressing well with exercise.         Comments: 30 Day Review

## 2016-06-13 ENCOUNTER — Encounter: Payer: Medicare Other | Admitting: *Deleted

## 2016-06-13 DIAGNOSIS — J449 Chronic obstructive pulmonary disease, unspecified: Secondary | ICD-10-CM

## 2016-06-13 DIAGNOSIS — I2721 Secondary pulmonary arterial hypertension: Secondary | ICD-10-CM

## 2016-06-13 DIAGNOSIS — I272 Pulmonary hypertension, unspecified: Secondary | ICD-10-CM | POA: Diagnosis not present

## 2016-06-13 NOTE — Progress Notes (Signed)
Daily Session Note  Patient Details  Name: Kristen Montgomery MRN: [id number] Date of Birth: 05/17/1951 Referring Provider:   Flowsheet Row Pulmonary Rehab from 03/20/2016 in [facility] Cardiac and Pulmonary Rehab  Referring Provider  Ford      Encounter Date: 06/13/2016  Check In:     Session Check In - 06/13/16 1143      Check-In   Location [facility]-Cardiac & Pulmonary Rehab   Staff Present Laureen Brown, BS, RRT, Respiratory Therapist;Jessica Hawkins, MA, ACSM RCEP, Exercise Physiologist   Supervising physician immediately available to respond to emergencies [facility] immediately available ER MD   Physician(s) Drs. Williams and Lord   Medication changes reported     No   Fall or balance concerns reported    No   Warm-up and Cool-down Performed as group-led instruction   Resistance Training Performed Yes   VAD Patient? No     Pain Assessment   Currently in Pain? No/denies   Multiple Pain Sites No         Goals Met:  Proper associated with RPD/PD & O2 Sat [facility] with exercise equipment Using PLB without cueing & demonstrates good technique Exercise tolerated well No report of cardiac concerns or symptoms Strength training completed today  Goals Unmet:  Not Applicable  Comments: Pt able to follow exercise prescription today without complaint.  Will continue to monitor for progression.    Dr. Mark Miller is Medical Director for [facility] and [facility]. 

## 2016-06-15 ENCOUNTER — Encounter: Payer: Medicare Other | Attending: Internal Medicine | Admitting: *Deleted

## 2016-06-15 DIAGNOSIS — I272 Pulmonary hypertension, unspecified: Secondary | ICD-10-CM | POA: Insufficient documentation

## 2016-06-15 DIAGNOSIS — I2721 Secondary pulmonary arterial hypertension: Secondary | ICD-10-CM

## 2016-06-15 DIAGNOSIS — J449 Chronic obstructive pulmonary disease, unspecified: Secondary | ICD-10-CM | POA: Insufficient documentation

## 2016-06-15 NOTE — Progress Notes (Signed)
Daily Session Note  Patient Details  Name: Kristen Montgomery MRN: [id number] Date of Birth: 10/13/1950 Referring Provider:   Flowsheet Row Pulmonary Rehab from 03/20/2016 in [facility] Cardiac and Pulmonary Rehab  Referring Provider  [name]      Encounter Date: 06/15/2016  Check In:     Session Check In - 06/15/16 1147      Check-In   Location [facility]-Cardiac & Pulmonary Rehab   Staff Present Alberteen Sam, MA, ACSM RCEP, Exercise Physiologist;Susanne Bice, RN, BSN, CCRP;Other   Supervising physician immediately available to respond to emergencies [facility] immediately available ER MD   Physician(s) Drs. Schevit and Veronese   Medication changes reported     No   Fall or balance concerns reported    No   Warm-up and Cool-down Performed as group-led Location manager Performed Yes   VAD Patient? No     Pain Assessment   Currently in Pain? No/denies   Multiple Pain Sites No         Goals Met:  Proper associated with RPD/PD & O2 Sat [employer] with exercise equipment Using PLB without cueing & demonstrates good technique Exercise tolerated well Strength training completed today  Goals Unmet:  Not Applicable  Comments: Pt able to follow exercise prescription today without complaint.  Will continue to monitor for progression. Attended Know Your Numbers education class   Dr. Emily Filbert is Medical Director for [place] and [facility]monary [facility].

## 2016-06-18 DIAGNOSIS — I272 Pulmonary hypertension, unspecified: Secondary | ICD-10-CM | POA: Diagnosis not present

## 2016-06-18 DIAGNOSIS — J449 Chronic obstructive pulmonary disease, unspecified: Secondary | ICD-10-CM | POA: Diagnosis not present

## 2016-06-18 DIAGNOSIS — I2721 Secondary pulmonary arterial hypertension: Secondary | ICD-10-CM

## 2016-06-18 NOTE — Progress Notes (Signed)
Daily Session Note  Patient Details  Name: Kristen Montgomery MRN: [id number] Date of Birth: [birth year] Referring Provider:   Flowsheet Row Pulmonary Rehab from 03/20/2016 in [facility] Cardiac and Pulmonary Rehab  Referring Provider  [name]      Encounter Date: 06/18/2016  Check In:     Session Check In - 06/18/16 1525      Check-In   Location [facility]-Cardiac & Pulmonary Rehab   Staff Present Carson Myrtle, BS, RRT, Respiratory Therapist;Kelly Amedeo Plenty, BS, ACSM CEP, Exercise Physiologist;Andreas Sobolewski Oletta Darter, BA, ACSM CEP, Exercise Physiologist   Supervising physician immediately available to respond to emergencies [facility] immediately available ER MD   Physician(s) Jimmye Norman and Mariea Clonts   Medication changes reported     No   Fall or balance concerns reported    No   Warm-up and Cool-down Performed as group-led instruction   Resistance Training Performed Yes   VAD Patient? No     VAD patient   Has back up controller? No     Pain Assessment   Currently in Pain? No/denies         Goals Met:  Proper associated with RPD/PD & O2 Sat [employer] with exercise equipment Exercise tolerated well Strength training completed today  Goals Unmet:  Not Applicable  Comments: Pt able to follow exercise prescription today without complaint.  Will continue to monitor for progression.    Dr. Emily Filbert is Medical Director for [place] and [facility]monary Rehabilitation.

## 2016-06-21 DIAGNOSIS — J449 Chronic obstructive pulmonary disease, unspecified: Secondary | ICD-10-CM | POA: Diagnosis not present

## 2016-06-22 ENCOUNTER — Encounter: Payer: Medicare Other | Admitting: *Deleted

## 2016-06-22 DIAGNOSIS — I2721 Secondary pulmonary arterial hypertension: Secondary | ICD-10-CM

## 2016-06-22 DIAGNOSIS — I272 Pulmonary hypertension, unspecified: Secondary | ICD-10-CM | POA: Diagnosis not present

## 2016-06-22 DIAGNOSIS — J449 Chronic obstructive pulmonary disease, unspecified: Secondary | ICD-10-CM

## 2016-06-22 NOTE — Progress Notes (Signed)
Daily Session Note  Patient Details  Name: Kristen Montgomery MRN: [id number] Date of Birth: January 25, 1951 Referring Provider:   April Manson Pulmonary Rehab from 03/20/2016 in [facility] Cardiac and Pulmonary Rehab  Referring Provider  [name]      Encounter Date: 06/22/2016  Check In:     Session Check In - 06/22/16 1159      Check-In   Location [facility]-Cardiac & Pulmonary Rehab   Staff Present Alberteen Sam, MA, ACSM RCEP, Exercise Physiologist;Rebecca Preston, DPT, CEEA  Levell July, RN   Supervising physician immediately available to respond to emergencies [facility] immediately available ER MD   Physician(s) Drs. Quale and Paduchowski   Medication changes reported     No   Fall or balance concerns reported    No   Warm-up and Cool-down Performed as group-led Location manager Performed Yes   VAD Patient? No     VAD patient   Has back up controller? No     Pain Assessment   Currently in Pain? No/denies   Multiple Pain Sites No         Goals Met:  Proper associated with RPD/PD & O2 Sat [employer] with exercise equipment Using PLB without cueing & demonstrates good technique Exercise tolerated well No report of cardiac concerns or symptoms Strength training completed today  Goals Unmet:  Not Applicable  Comments: Pt able to follow exercise prescription today without complaint.  Will continue to monitor for progression.    Dr. Emily Filbert is Medical Director for [place] and [facility].

## 2016-06-25 DIAGNOSIS — J449 Chronic obstructive pulmonary disease, unspecified: Secondary | ICD-10-CM

## 2016-06-25 DIAGNOSIS — I2721 Secondary pulmonary arterial hypertension: Secondary | ICD-10-CM

## 2016-06-25 DIAGNOSIS — I272 Pulmonary hypertension, unspecified: Secondary | ICD-10-CM | POA: Diagnosis not present

## 2016-06-25 NOTE — Progress Notes (Signed)
Daily Session Note  Patient Details  Name: Kristen Montgomery MRN: [id number] Date of Birth: 05-18-1951 Referring Provider:   Flowsheet Row Pulmonary Rehab from 03/20/2016 in [facility] Cardiac and Pulmonary Rehab  Referring Provider  [name]      Encounter Date: 06/25/2016  Check In:     Session Check In - 06/25/16 1312      Check-In   Location [facility]-Cardiac & Pulmonary Rehab   Staff Present Carson Myrtle, BS, RRT, Respiratory Therapist;Kelly Amedeo Plenty, BS, ACSM CEP, Exercise Physiologist;Amanda Oletta Darter, BA, ACSM CEP, Exercise Physiologist   Supervising physician immediately available to respond to emergencies [facility] immediately available ER MD   Physician(s) Astrid Divine and Joni Fears   Medication changes reported     No   Fall or balance concerns reported    No   Warm-up and Cool-down Performed as group-led Location manager Performed Yes   VAD Patient? No     VAD patient   Has back up controller? No     Pain Assessment   Currently in Pain? No/denies         Goals Met:  Proper associated with RPD/PD & O2 Sat [employer] with exercise equipment Exercise tolerated well Strength training completed today  Goals Unmet:  Not Applicable  Comments: Pt able to follow exercise prescription today without complaint.  Will continue to monitor for progression.    Dr. Emily Filbert is Medical Director for [place] and [facility]monary Rehabilitation.

## 2016-06-25 NOTE — Progress Notes (Signed)
Kristen Montgomery psychosocial assessment reveals no barriers at this time to participation in Pulmonary Rehab.  Kristen Montgomery has good family and friend support that encourages Kristen Montgomery to participate in [place] and progress with Her goals.  Kristen Montgomery concerns are monitored, but Kristen Montgomery has acknowledge that attending the program has helped to maintain quality life with improved mobility, self-care, and emotional and financial stability.  Kristen Montgomery is commended for regular attendance and self-motivation to improve Her pulmonary disease management. Kristen Montgomery graduates soon. This has been her second time attending pul rehab and has benefited greatly from the program - increasing her knowledge of Kristen Montgomery and COPD and increasing her stamina and strength. Kristen Montgomery has a very supportive family, and Kristen Montgomery knows the benefits of continuing exercise.

## 2016-06-27 ENCOUNTER — Encounter: Payer: Medicare Other | Admitting: *Deleted

## 2016-06-27 DIAGNOSIS — J449 Chronic obstructive pulmonary disease, unspecified: Secondary | ICD-10-CM | POA: Diagnosis not present

## 2016-06-27 DIAGNOSIS — I2721 Secondary pulmonary arterial hypertension: Secondary | ICD-10-CM

## 2016-06-27 DIAGNOSIS — I272 Pulmonary hypertension, unspecified: Secondary | ICD-10-CM | POA: Diagnosis not present

## 2016-06-27 NOTE — Progress Notes (Signed)
Daily Session Note  Patient Details  Name: MAXCINE STRONG MRN: [id number] Date of Birth: 04-05-1951 Referring Provider:   April Manson Pulmonary Rehab from 03/20/2016 in [facility] Cardiac and Pulmonary Rehab  Referring Provider  Merrily Brittle Date: 06/27/2016  Check In:     Session Check In - 06/27/16 1232      Check-In   Location [facility]-Cardiac & Pulmonary Rehab   Staff Present Alberteen Sam, MA, ACSM RCEP, Exercise Physiologist;Laureen Owens Shark, BS, RRT, Respiratory Therapist;Other   Supervising physician immediately available to respond to emergencies [facility] immediately available ER MD   Physician(s) Drs. Norm Salt, and [place]   Medication changes reported     No   Fall or balance concerns reported    No   Warm-up and Cool-down Performed as group-led instruction   Resistance Training Performed Yes   VAD Patient? No     VAD patient   Has back up controller? No     Pain Assessment   Currently in Pain? No/denies   Multiple Pain Sites No         Goals Met:  Proper associated with RPD/PD & O2 Sat [employer] with exercise equipment Using PLB without cueing & demonstrates good technique Exercise tolerated well Strength training completed today  Goals Unmet:  Not Applicable  Comments: Pt able to follow exercise prescription today without complaint.  Will continue to monitor for progression.    Dr. Emily Filbert is Medical Director for [place] and [facility]monary [facility].

## 2016-06-29 ENCOUNTER — Encounter: Payer: Medicare Other | Admitting: *Deleted

## 2016-06-29 DIAGNOSIS — J449 Chronic obstructive pulmonary disease, unspecified: Secondary | ICD-10-CM

## 2016-06-29 DIAGNOSIS — I272 Pulmonary hypertension, unspecified: Secondary | ICD-10-CM | POA: Diagnosis not present

## 2016-06-29 DIAGNOSIS — I2721 Secondary pulmonary arterial hypertension: Secondary | ICD-10-CM

## 2016-06-29 NOTE — Progress Notes (Signed)
Daily Session Note  Patient Details  Name: Kristen Montgomery MRN: [id number] Date of Birth: Apr 16, 1951 Referring Provider:   April Manson Pulmonary Rehab from 03/20/2016 in [facility] Cardiac and Pulmonary Rehab  Referring Provider  [name]      Encounter Date: 06/29/2016  Check In:     Session Check In - 06/29/16 1137      Check-In   Location [facility]-Cardiac & Pulmonary Rehab   Staff Present Alberteen Sam, MA, ACSM RCEP, Exercise Physiologist;Susanne Bice, RN, BSN, CCRP;Other  Simmie Davies, RN, BSN   Supervising physician immediately available to respond to emergencies [facility] immediately available ER MD   Physician(s) Drs. Earnstine Regal, and Paduchowski   Medication changes reported     No   Fall or balance concerns reported    No   Warm-up and Cool-down Performed as group-led instruction   Resistance Training Performed Yes   VAD Patient? No     VAD patient   Has back up controller? No     Pain Assessment   Currently in Pain? No/denies   Multiple Pain Sites No         Goals Met:  Proper associated with RPD/PD & O2 Sat [employer] with exercise equipment Using PLB without cueing & demonstrates good technique Exercise tolerated well Strength training completed today  Goals Unmet:  Not Applicable  Comments: Pt able to follow exercise prescription today without complaint.  Will continue to monitor for progression.    Dr. Emily Filbert is Medical Director for [place] and [facility]monary [facility].

## 2016-07-02 ENCOUNTER — Encounter: Payer: Self-pay | Admitting: Respiratory Therapy

## 2016-07-02 DIAGNOSIS — I2721 Secondary pulmonary arterial hypertension: Secondary | ICD-10-CM

## 2016-07-02 DIAGNOSIS — J449 Chronic obstructive pulmonary disease, unspecified: Secondary | ICD-10-CM

## 2016-07-02 DIAGNOSIS — I272 Pulmonary hypertension, unspecified: Secondary | ICD-10-CM | POA: Diagnosis not present

## 2016-07-02 NOTE — Progress Notes (Signed)
Pulmonary Individual Treatment Plan  Patient Details  Name: Kristen Montgomery MRN: [id number] Date of Birth: 10-18-1950 Referring Provider:   Flowsheet Row Pulmonary Rehab from 03/20/2016 in [facility] Cardiac and Pulmonary Rehab  Referring Provider  [name]      Initial Encounter Date:  Flowsheet Row Pulmonary Rehab from 03/20/2016 in [facility] Cardiac and Pulmonary Rehab  Date  03/20/16  Referring Provider  [name]      Visit Diagnosis: PAH (pulmonary artery hypertension)  COPD, severe ([place])  Patient's Home Medications on Admission:  Current Outpatient Prescriptions:    albuterol (ACCUNEB) 0.63 MG/3ML nebulizer solution, Inhale 1 ampule by nebulization every six (6) hours as needed for wheezing., Disp: , Rfl:    albuterol (VENTOLIN HFA) 108 (90 Base) MCG/ACT inhaler, INHALE 2 PUFFS BY MOUTH EVERY 4 TO 6 HOURS AS NEEDED FOR SHORTNESS OF BREATH, COUGH OR WHEEZE, Disp: , Rfl:    aspirin EC 81 MG tablet, Take by mouth., Disp: , Rfl:    carvedilol (COREG) 3.125 MG tablet, Take 3.125 mg by mouth., Disp: , Rfl:    docusate sodium (COLACE) 100 MG capsule, Take 100 mg by mouth., Disp: , Rfl:    furosemide (LASIX) 40 MG tablet, Take 40 mg by mouth., Disp: , Rfl:    losartan (COZAAR) 50 MG tablet, Take 50 mg by mouth 1 day or 1 dose., Disp: , Rfl:    omeprazole (PRILOSEC) 20 MG capsule, Take 20 mg by mouth., Disp: , Rfl:    potassium chloride SA (K-DUR,KLOR-CON) 20 MEQ tablet, Take 10 mEq by mouth., Disp: , Rfl:    tiotropium (SPIRIVA HANDIHALER) 18 MCG inhalation capsule, INHALE 1 CAPSULE VIA HANDIHALER ONCE DAILY AT THE SAME TIME EVERY DAY, Disp: , Rfl:    traZODone (DESYREL) 50 MG tablet, TAKE 1 TABLET BY MOUTH AT BEDTIME AS NEEDED, Disp: , Rfl:    Treprostinil (TYVASO) 0.6 MG/ML SOLN, Inhale 3-9 breaths four times daily., Disp: , Rfl:   Past Medical History: No past medical history on file.  Tobacco Use: History  Smoking Status   Not on file  Smokeless Tobacco   Not on file     Labs: Recent Review Flowsheet Data    There is no flowsheet data to display.       ADL UCSD:     Pulmonary Assessment Scores    Row Name 03/20/16 1435 05/11/16 1324 06/29/16 1150     ADL UCSD   ADL Phase Entry Mid Exit   SOB Score total 68 74 86   Rest 0 0 0   Walk _0 Stairs _1 Bath _2 Dress _3 Shop 3 0 3      Pulmonary Function Assessment:     Pulmonary Function Assessment - 03/20/16 1433      Initial Spirometry Results   FVC% 31 %   FEV1% 27 %   FEV1/FVC Ratio 68.6   Comments Test date 03/20/2016      Post Bronchodilator Spirometry Results   FVC% 46 %   FEV1% 29 %   FEV1/FVC Ratio 62.47     Breath   Bilateral Breath Sounds Decreased   Shortness of Breath Yes;Fear of Shortness of Breath;Limiting activity      Exercise Target Goals:    Exercise Program Goal: Individual exercise prescription set with THRR, safety & activity barriers. Participant demonstrates ability to understand and report RPE using BORG scale, to self-measure pulse accurately, and to  acknowledge the importance of the exercise prescription.  Exercise Prescription Goal: Starting with aerobic activity 30 plus minutes a day, 3 days per week for initial exercise prescription. Provide home exercise prescription and guidelines that participant acknowledges understanding prior to discharge.  Activity Barriers & Risk Stratification:     Activity Barriers & Cardiac Risk Stratification - 03/20/16 1432      Activity Barriers & Cardiac Risk Stratification   Activity Barriers Shortness of Breath;Deconditioning   Cardiac Risk Stratification Moderate      6 Minute Walk:     6 Minute Walk    Row Name 03/20/16 1332 05/11/16 1331 05/11/16 1336     6 Minute Walk   Phase  -- Mid Program Mid Program   Distance 340 feet  -- 270 feet   Distance % Change  --  -- -20.6 %   Walk Time 3 minutes  -- 2.02 minutes   # of Rest Breaks 3  -- 3  1:21, 20 sec, 2:18   MPH 1.28  --  1.52   METS 1.17  -- 2.2   RPE 13  -- 17   Perceived Dyspnea  4  -- 4   VO2 Peak 4.11  -- 2.95   Symptoms No  -- Yes (comment)   Comments  --  -- SOB, fatigue   Resting HR 96 bpm  -- 96 bpm   Resting BP 128/60  -- 122/62   Max Ex. HR 116 bpm  -- 114 bpm   Max Ex. BP 142/62  -- 126/74   2 Minute Post BP 104/64  -- 122/70     Interval HR   Baseline HR 96  -- 96   1 Minute HR 113  -- 114   2 Minute HR 117  -- 112   3 Minute HR 114  -- 114   4 Minute HR  --  -- 106   5 Minute HR 116  -- 98   6 Minute HR  --  -- 101   2 Minute Post HR 100  -- 101   Interval Heart Rate? Yes  -- Yes     Interval Oxygen   Interval Oxygen? Yes  -- Yes   Baseline Oxygen Saturation % 93 %  -- 95 %   Baseline Liters of Oxygen 4 L  -- 3 L   1 Minute Oxygen Saturation % 93 %  -- 91 %  57 sec 83%, 1:13 91%   1 Minute Liters of Oxygen 4 L  -- 4 L   2 Minute Oxygen Saturation % 88 %  -- 87 %   2 Minute Liters of Oxygen 4 L  -- 4 L   3 Minute Oxygen Saturation % 93 %  -- 85 %   3 Minute Liters of Oxygen 4 L  -- 4 L   4 Minute Oxygen Saturation %  --  -- 87 %  3:26 81%   4 Minute Liters of Oxygen 4 L  -- 4 L   5 Minute Oxygen Saturation % 86 %  -- 93 %   5 Minute Liters of Oxygen 4 L  -- 4 L   6 Minute Oxygen Saturation %  --  -- 87 %  7:00 85%   6 Minute Liters of Oxygen 4 L  -- 4 L   2 Minute Post Oxygen Saturation % 100 %  -- 90 %   2 Minute Post Liters of Oxygen 4 L  -- 4 L  Initial Exercise Prescription:     Initial Exercise Prescription - 03/20/16 1300      Date of Initial Exercise RX and Referring Provider   Date 03/20/16   Referring Provider [name]     Oxygen   Oxygen Continuous   Liters 4     Treadmill   MPH 0.5   Grade 0   Minutes 15  2 then rest and repeat     NuStep   Level 1   Minutes 15  2 then rest and repeat   METs 1     Arm Ergometer   Level 1   Minutes 15  2 min then rest and repeat     T5 Nustep   Level 1   Minutes 15  2 min then rest and repeat    METs 1     Prescription Details   Frequency (times per week) 3   Duration Progress to 30 minutes of continuous aerobic without signs/symptoms of physical distress     Intensity   THRR 40-80% of Max Heartrate 120-144   Ratings of Perceived Exertion 11-13   Perceived Dyspnea 0-4     Progression   Progression Continue to progress workloads to maintain intensity without signs/symptoms of physical distress.     Resistance Training   Training Prescription Yes   Weight 1   Reps 10-15      Perform Capillary Blood Glucose checks as needed.  Exercise Prescription Changes:     Exercise Prescription Changes    Row Name 03/26/16 1300 04/12/16 1100 04/24/16 1500 05/09/16 1600 05/23/16 1500     Exercise Review   Progression  --  -- Yes Yes Yes     Response to Exercise   Blood Pressure (Admit) 130/66 124/78 98/60 110/54 142/70   Blood Pressure (Exercise) 130/72 130/78 126/70 130/66 146/74   Blood Pressure (Exit) 120/88 120/72 100/60 112/72 122/78   Heart Rate (Admit) 92 bpm 91 bpm 100 bpm 83 bpm 89 bpm   Heart Rate (Exercise) 117 bpm 117 bpm 131 bpm 113 bpm 117 bpm   Heart Rate (Exit) 88 bpm 86 bpm 96 bpm 92 bpm 95 bpm   Oxygen Saturation (Admit) 94 % 95 % 91 % 92 % 92 %   Oxygen Saturation (Exercise) 86 %  moved up to 6L - O2 increased to 90 90 % 88 % 87 % 89 %   Oxygen Saturation (Exit) 98 % 98 % 99 % 98 % 97 %   Rating of Perceived Exertion (Exercise) _0 Perceived Dyspnea (Exercise) _1 Symptoms  --  -- none none none   Comments  --  --  --  -- Home Exercise Guidelines given 05/11/16   Duration Progress to 30 minutes of continuous aerobic without signs/symptoms of physical distress Progress to 45 minutes of aerobic exercise without signs/symptoms of physical distress Progress to 45 minutes of aerobic exercise without signs/symptoms of physical distress Progress to 45 minutes of aerobic exercise without signs/symptoms of physical distress Progress to 45 minutes  of aerobic exercise without signs/symptoms of physical distress   Intensity _2      Progression   Progression Continue to progress workloads to maintain intensity without signs/symptoms of physical distress. Continue to progress workloads to maintain intensity without signs/symptoms of physical distress. Continue to progress workloads to maintain intensity without signs/symptoms of physical distress. Continue to progress workloads to maintain intensity  without signs/symptoms of physical distress. Continue to progress workloads to maintain intensity without signs/symptoms of physical distress.   Average METs  --  -- 11.63 1.9 1.67     Resistance Training   Training Prescription Yes  -- Yes Yes Yes   Weight 1  -- 1 2 lbs 2 lbs   Reps 10-15  -- 10-15 10-12 10-12     Interval Training   Interval Training No  -- No No No     Oxygen   Oxygen _0    Liters _1 Treadmill   MPH 0.8 0.8 0.8 0.5 0.8   Grade 0 0 0 0 0   Minutes 15  2/2/2 15  6/4/_2 min x3 16  6 min 5 min 5 min 15     NuStep   Level _3 Minutes 15  5/3/_4 METs 1  -- 2.3 2.1 2.4     Biostep-RELP   Level  -- _5 Minutes  -- _6 METs  --  -- _7 Home Exercise Plan   Plans to continue exercise at  --  --  --  -- Home  walking, chair exercises   Frequency  --  --  --  -- Add 2 additional days to program exercise sessions.   Row Name 06/05/16 1600 06/19/16 1500           Exercise Review   Progression Yes Yes        Response to Exercise   Blood Pressure (Admit) 142/62 122/78      Blood Pressure (Exercise) 126/74 138/72      Blood Pressure (Exit) 112/60 124/60      Heart Rate (Admit) 89 bpm 101 bpm      Heart Rate (Exercise) 115 bpm 114 bpm      Heart Rate (Exit) 99 bpm 96 bpm      Oxygen Saturation (Admit) 96 % 91 %      Oxygen  Saturation (Exercise) 90 % 88 %      Oxygen Saturation (Exit) 96 % 93 %      Rating of Perceived Exertion (Exercise) 13 14      Perceived Dyspnea (Exercise) 7 4      Symptoms none none      Comments Home Exercise Guidelines given 05/11/16 Home Exercise Guidelines given 05/11/16      Duration Progress to 45 minutes of aerobic exercise without signs/symptoms of physical distress Progress to 45 minutes of aerobic exercise without signs/symptoms of physical distress      Intensity THRR unchanged THRR unchanged        Progression   Progression Continue to progress workloads to maintain intensity without signs/symptoms of physical distress. Continue to progress workloads to maintain intensity without signs/symptoms of physical distress.      Average METs 1.83 1.43        Resistance Training   Training Prescription Yes Yes      Weight 2 lbs 2 lbs      Reps 10-12 10-12        Interval Training   Interval Training No No        Oxygen   Oxygen Continuous Continuous      Liters 4 4        Treadmill  MPH 0.8 0.8      Grade 0 0      Minutes _0 NuStep   Level 3 3      Minutes 15 15      METs 1.8 1.6        Biostep-RELP   Level 2 2      Minutes 15 15      METs 2 1        Home Exercise Plan   Plans to continue exercise at Home  walking, chair exercises Home  walking, chair exercises      Frequency Add 2 additional days to program exercise sessions. Add 2 additional days to program exercise sessions.         Exercise Comments:     Exercise Comments    Row Name 03/20/16 1344 03/26/16 1325 04/12/16 1130 04/24/16 1553 05/09/16 1601   Exercise Comments Harriet uses a wheel chair - she did not seem to have trouble with balance other than being very deconditioned and not being able to walk very far. Ilya did well for her first day of exercise.  Her sats dropped to 86 at 2 L so she uses 6L contiuous during exercise. Oakley is progressing well with exercise. Almer is now  getting 5 min intervals on the treadmill consistently.  She has also moved down to join the class when on the treadmill, not worrying about how she looks.  We will continue to monitor her progression. Tinya is almost half way through the program.  She is doing great.  We wil continue to monitor her progression.   [place] Name 05/11/16 1343 05/18/16 1254 05/23/16 1405 06/05/16 1634 06/19/16 1541   Exercise Comments Reviewed home exercise with pt today.  Pt plans to use pedaler at home for exercise.  Reviewed THR, pulse, RPE, sign and symptoms, and when to call 911 or MD.  Also discussed weather considerations and indoor options.  Pt voiced understanding. Pt desaturated on the treadmill to 84% on 4L O2.  Pt was using pursed lip breathing on her own without queing, O2 was turned up to 6L and pt took a rest break.  After interventions, O2 sat improved to 98% on 6L.  On the NuStep, pt self-adjusted her O2 down to 3L.  Pt remained on 3L O2 on NuStep and Biostep with saturations at 94%.  Itza continues to come to rehab for exercise.  It gets her out of the house and moving.  She is doing some on her own at home.  We will continue to encourage her to come to rehab and to exercise at home. Wynee has been doing well in rehab.  She is getting 10 min straight on the treadmill!!! We will continue to monitor her progression. Miriah continues to do well in rehab.  She has done a full 15 min on the treadmill!  We will continue to monitor her progression.   [place] Name 06/22/16 1210 06/27/16 1232         Exercise Comments Reviewed METs average and discussed progression with pt today. Tyler did her full 15 minutes on the treadmill without stopping today!!         Discharge Exercise Prescription (Final Exercise Prescription Changes):     Exercise Prescription Changes - 06/19/16 1500      Exercise Review   Progression Yes     Response to Exercise   Blood Pressure (Admit) 122/78   Blood  Pressure (Exercise) 138/72   Blood  Pressure (Exit) 124/60   Heart Rate (Admit) 101 bpm   Heart Rate (Exercise) 114 bpm   Heart Rate (Exit) 96 bpm   Oxygen Saturation (Admit) 91 %   Oxygen Saturation (Exercise) 88 %   Oxygen Saturation (Exit) 93 %   Rating of Perceived Exertion (Exercise) 14   Perceived Dyspnea (Exercise) 4   Symptoms none   Comments Home Exercise Guidelines given 05/11/16   Duration Progress to 45 minutes of aerobic exercise without signs/symptoms of physical distress   Intensity THRR unchanged     Progression   Progression Continue to progress workloads to maintain intensity without signs/symptoms of physical distress.   Average METs 1.43     Resistance Training   Training Prescription Yes   Weight 2 lbs   Reps 10-12     Interval Training   Interval Training No     Oxygen   Oxygen Continuous   Liters 4     Treadmill   MPH 0.8   Grade 0   Minutes 15     NuStep   Level 3   Minutes 15   METs 1.6     Biostep-RELP   Level 2   Minutes 15   METs 1     Home Exercise Plan   Plans to continue exercise at Home  walking, chair exercises   Frequency Add 2 additional days to program exercise sessions.       Nutrition:  Target Goals: Understanding of nutrition guidelines, daily intake of sodium <1527m, cholesterol <2018m calories 30% from fat and 7% or less from saturated fats, daily to have 5 or more servings of fruits and vegetables.  Biometrics:     Pre Biometrics - 03/20/16 1331      Pre Biometrics   Height _0  (1.499 m)   Weight 188 lb 6.4 oz (85.5 kg)   Waist Circumference 44.5 inches   Hip Circumference 51.13 inches   Waist to Hip Ratio 0.87 %   BMI (Calculated) 38.1       Nutrition Therapy Plan and Nutrition Goals:   Nutrition Discharge: Rate Your Plate Scores:   Psychosocial: Target Goals: Acknowledge presence or absence of depression, maximize coping skills, provide positive support system. Participant is able to verbalize types and ability to use  techniques and skills needed for reducing stress and depression.  Initial Review & Psychosocial Screening:     Initial Psych Review & Screening - 03/20/16 1253      Initial Review   Current issues with Current Stress Concerns   Comments JeMichaeliaeports that she had tried to exercise in the [facility] gym but her father coded upstairs in the hospital and died. JeDanineaid she had not been back to exercise since then. JeJosiephineaid that she knows she needs a hip surgery but she is not sure when the doctor can do it. We discussed that Pulm Rehab can get her strength up for her upcoming hip surgery.      Family Dynamics   Good Support System? Yes   Concerns Recent loss of significant other   Comments Her father died since she has attended Pulm REhab last     Barriers   Psychosocial barriers to participate in program The patient should benefit from training in stress management and relaxation.     Screening Interventions   Interventions Encouraged to exercise      Quality of Life Scores:     Quality of Life -  06/29/16 1151      Quality of Life Scores   Health/Function Pre 20.93 %   Health/Function Post 20.4 %   Health/Function % Change -2.53 %   Socioeconomic Pre 21 %   Socioeconomic Post 27.43 %   Socioeconomic % Change  30.62 %   Psych/Spiritual Pre 21 %   Psych/Spiritual Post 27.43 %   Psych/Spiritual % Change 30.62 %   Family Pre 21 %   Family Post 30 %   Family % Change 42.86 %   GLOBAL Pre 20.97 %   GLOBAL Post 24.71 %   GLOBAL % Change 17.84 %      PHQ-9: Recent Review Flowsheet Data    Depression screen [facility] 2/9 06/29/2016 03/20/2016   Decreased Interest 0 0   Down, Depressed, Hopeless 0 0   PHQ - 2 Score 0 0   Altered sleeping 0 0   Tired, decreased energy 0 0   Change in appetite 0 0   Feeling bad or failure about yourself  0 0   Trouble concentrating 0 0   Moving slowly or fidgety/restless 0 0   Suicidal thoughts 0 0   PHQ-9 Score 0 0      Psychosocial  Evaluation and Intervention:     Psychosocial Evaluation - 06/25/16 1608      Discharge Psychosocial Assessment & Intervention   Comments Romie Minus psychosocial assessment reveals no barriers at this time to participation in Pulmonary Rehab.  She has good family and friend support that encourages Tyhesha to participate in [place] and progress with Her goals.  Cassey concerns are monitored, but she has acknowledge that attending the program has helped to maintain quality life with improved mobility, self-care, and emotional and financial stability.  Winona is commended for regular attendance and self-motivation to improve Her pulmonary disease management. Romie Minus graduates soon. This has been her second time attending pul rehab and has benefited greatly from the program - increasing her knowledge of [place] and COPD and increasing her stamina and strength. She has a very supportive family, and she knows the benefits of continuing exercise      Psychosocial Re-Evaluation:     Psychosocial Re-Evaluation    [place] Name 05/07/16 1239 06/06/16 1236 06/06/16 1340         Psychosocial Re-Evaluation   Comments Counselor follow up with Ms. S today.  She reports having increased energy and walking a little better since coming in to this program.  She is now sleeping better as well and is no longer on the medications prescribed earlier.  Ms. Chauncey Cruel states her mood continues to be stable and positive and she is accomplishing many of her goals she set when she began this program.  Counselor commended her on her progress made. Romie Minus psychosocial assessment reveals no barriers at this time to participation in Pulmonary Rehab.  she has good family and friend support that encourages Tryphena to participate in [place] and progress with Her goals.  [place] concerns are monitored, but she has acknowledge that attending the program has helped to maintain quality life with improved mobility, self-care, and emotional and financial stability.  Lacee is  commended for regular attendance and self-motivation to improve her pulmonary disease management. Romie Minus psychosocial assessment reveals no barriers at this time to participation in Pulmonary Rehab.  she has good family and friend support that encourages Shelisha to participate in [place] and progress with Her goals.  Alfie concerns are monitored, but she has acknowledge that attending the program has helped  to maintain quality life with improved mobility, self-care, and emotional and financial stability.  Stormey is commended for regular attendance and self-motivation to improve Her pulmonary disease management.       Education: Education Goals: Education classes will be provided on a weekly basis, covering required topics. Participant will state understanding/return demonstration of topics presented.  Learning Barriers/Preferences:     Learning Barriers/Preferences - 03/20/16 1432      Learning Barriers/Preferences   Learning Barriers None   Learning Preferences Group Instruction;Individual Instruction;Pictoral;Skilled Demonstration;Verbal Instruction;Video;Written Material      Education Topics: Initial Evaluation Education: - Verbal, written and demonstration of respiratory meds, RPE/PD scales, oximetry and breathing techniques. Instruction on use of nebulizers and MDIs: cleaning and proper use, rinsing mouth with steroid doses and importance of monitoring MDI activations. Flowsheet Row Pulmonary Rehab from 06/29/2016 in [facility] Cardiac and Pulmonary Rehab  Date  03/20/16  Educator  LB  Instruction Review Code  2- meets goals/outcomes      General Nutrition Guidelines/Fats and Fiber: -Group instruction provided by verbal, written material, models and posters to present the general guidelines for heart healthy nutrition. Gives an explanation and review of dietary fats and fiber.   Controlling Sodium/Reading Food Labels: -Group verbal and written material supporting the discussion of sodium  use in heart healthy nutrition. Review and explanation with models, verbal and written materials for utilization of the food label.   Exercise Physiology & Risk Factors: - Group verbal and written instruction with models to review the exercise physiology of the cardiovascular system and associated critical values. Details cardiovascular disease risk factors and the goals associated with each risk factor. Flowsheet Row Pulmonary Rehab from 06/29/2016 in [facility] Cardiac and Pulmonary Rehab  Date  05/30/16  Educator  AS  Instruction Review Code  2- meets goals/outcomes      Aerobic Exercise & Resistance Training: - Gives group verbal and written discussion on the health impact of inactivity. On the components of aerobic and resistive training programs and the benefits of this training and how to safely progress through these programs. Flowsheet Row Pulmonary Rehab from 06/29/2016 in [facility] Cardiac and Pulmonary Rehab  Date  04/04/16  Educator  JH/AS  Instruction Review Code  2- meets goals/outcomes      Flexibility, Balance, General Exercise Guidelines: - Provides group verbal and written instruction on the benefits of flexibility and balance training programs. Provides general exercise guidelines with specific guidelines to those with heart or lung disease. Demonstration and skill practice provided.   Stress Management: - Provides group verbal and written instruction about the health risks of elevated stress, cause of high stress, and healthy ways to reduce stress.   Depression: - Provides group verbal and written instruction on the correlation between heart/lung disease and depressed mood, treatment options, and the stigmas associated with seeking treatment. Flowsheet Row Pulmonary Rehab from 06/29/2016 in [facility] Cardiac and Pulmonary Rehab  Date  06/18/16  Educator  [facility]  Instruction Review Code  2- meets goals/outcomes      Exercise & Equipment Safety: - Individual verbal instruction  and demonstration of equipment use and safety with use of the equipment. Flowsheet Row Pulmonary Rehab from 06/29/2016 in [facility] Cardiac and Pulmonary Rehab  Date  03/26/16  Educator  AS  Instruction Review Code  2- meets goals/outcomes      Infection Prevention: - Provides verbal and written material to individual with discussion of infection control including proper hand washing and proper equipment cleaning during exercise session. Flowsheet Row  Pulmonary Rehab from 06/29/2016 in [facility] Cardiac and Pulmonary Rehab  Date  03/26/16  Educator  AS  Instruction Review Code  2- meets goals/outcomes      Falls Prevention: - Provides verbal and written material to individual with discussion of falls prevention and safety. Flowsheet Row Pulmonary Rehab from 06/29/2016 in [facility] Cardiac and Pulmonary Rehab  Date  03/20/16  Educator  C. [employer] Therapist, sports  Instruction Review Code  1- partially meets, needs review/practice      Diabetes: - Individual verbal and written instruction to review signs/symptoms of diabetes, desired ranges of glucose level fasting, after meals and with exercise. Advice that pre and post exercise glucose checks will be done for 3 sessions at entry of program.   Chronic Lung Diseases: - Group verbal and written instruction to review new updates, new respiratory medications, new advancements in procedures and treatments. Provide informative websites and "800" numbers of self-education. Flowsheet Row Pulmonary Rehab from 06/29/2016 in [facility] Cardiac and Pulmonary Rehab  Date  06/13/16  Educator  LB  Instruction Review Code  2- meets goals/outcomes      Lung Procedures: - Group verbal and written instruction to describe testing methods done to diagnose lung disease. Review the outcome of test results. Describe the treatment choices: Pulmonary Function Tests, ABGs and oximetry.   Energy Conservation: - Provide group verbal and written instruction for methods to conserve  energy, plan and organize activities. Instruct on pacing techniques, use of adaptive equipment and posture/positioning to relieve shortness of breath. Flowsheet Row Pulmonary Rehab from 06/29/2016 in [facility] Cardiac and Pulmonary Rehab  Date  04/25/16  Educator  Fredderick Erb, EP  Instruction Review Code  2- meets goals/outcomes      Triggers: - Group verbal and written instruction to review types of environmental controls: home humidity, furnaces, filters, dust mite/pet prevention, HEPA vacuums. To discuss weather changes, air quality and the benefits of nasal washing. Flowsheet Row Pulmonary Rehab from 06/29/2016 in [facility] Cardiac and Pulmonary Rehab  Date  03/26/16  Educator  LB  Instruction Review Code  2- meets goals/outcomes      Exacerbations: - Group verbal and written instruction to provide: warning signs, infection symptoms, calling MD promptly, preventive modes, and value of vaccinations. Review: effective airway clearance, coughing and/or vibration techniques. Create an Sports administrator.   Oxygen: - Individual and group verbal and written instruction on oxygen therapy. Includes supplement oxygen, available portable oxygen systems, continuous and intermittent flow rates, oxygen safety, concentrators, and Medicare reimbursement for oxygen.   Respiratory Medications: - Group verbal and written instruction to review medications for lung disease. Drug class, frequency, complications, importance of spacers, rinsing mouth after steroid MDI's, and proper cleaning methods for nebulizers.   AED/CPR: - Group verbal and written instruction with the use of models to demonstrate the basic use of the AED with the basic ABC's of resuscitation. Flowsheet Row Pulmonary Rehab from 06/29/2016 in [facility] Cardiac and Pulmonary Rehab  Date  05/25/16  Educator  CE  Instruction Review Code  2- meets goals/outcomes      Breathing Retraining: - Provides individuals verbal and written instruction on purpose,  frequency, and proper technique of diaphragmatic breathing and pursed-lipped breathing. Applies individual practice skills. Flowsheet Row Pulmonary Rehab from 06/29/2016 in [facility] Cardiac and Pulmonary Rehab  Date  03/20/16  Educator  C. EnterkinRN  Instruction Review Code  2- meets goals/outcomes      Anatomy and Physiology of the Lungs: - Group verbal and written instruction with the use  of models to provide basic lung anatomy and physiology related to function, structure and complications of lung disease.   Heart Failure: - Group verbal and written instruction on the basics of heart failure: signs/symptoms, treatments, explanation of ejection fraction, enlarged heart and cardiomyopathy. Flowsheet Row Pulmonary Rehab from 06/29/2016 in [facility] Cardiac and Pulmonary Rehab  Date  06/29/16  Educator  SB  Instruction Review Code  2- meets goals/outcomes      Sleep Apnea: - Individual verbal and written instruction to review Obstructive Sleep Apnea. Review of risk factors, methods for diagnosing and types of masks and machines for OSA.   Anxiety: - Provides group, verbal and written instruction on the correlation between heart/lung disease and anxiety, treatment options, and management of anxiety.   Relaxation: - Provides group, verbal and written instruction about the benefits of relaxation for patients with heart/lung disease. Also provides patients with examples of relaxation techniques.   Knowledge Questionnaire Score:     Knowledge Questionnaire Score - 06/29/16 1150      Knowledge Questionnaire Score   Pre Score 9/10   Post Score 7/10       Core Components/Risk Factors/Patient Goals at Admission:     Personal Goals and Risk Factors at Admission - 03/20/16 1438      Core Components/Risk Factors/Patient Goals on Admission   Develop more efficient breathing techniques such as purse lipped breathing and diaphragmatic breathing; and practicing self-pacing with activity Yes    Intervention Provide education, demonstration and support about specific breathing techniuqes utilized for more efficient breathing. Include techniques such as pursed lipped breathing, diaphragmatic breathing and self-pacing activity.   Expected Outcomes Short Term: Participant will be able to demonstrate and use breathing techniques as needed throughout daily activities.   Increase knowledge of respiratory medications and ability to use respiratory devices properly  Yes   Intervention Provide education and demonstration as needed of appropriate use of medications, inhalers, and oxygen therapy.   Expected Outcomes Short Term: Achieves understanding of medications use. Understands that oxygen is a medication prescribed by physician. Demonstrates appropriate use of inhaler and oxygen therapy.   Hypertension Yes   Intervention Provide education on lifestyle modifcations including regular physical activity/exercise, weight management, moderate sodium restriction and increased consumption of fresh fruit, vegetables, and low fat dairy, alcohol moderation, and smoking cessation.;Monitor prescription use compliance.   Expected Outcomes Short Term: Continued assessment and intervention until BP is < 140/69m HG in hypertensive participants. < 130/861mHG in hypertensive participants with diabetes, heart failure or chronic kidney disease.;Long Term: Maintenance of blood pressure at goal levels.      Core Components/Risk Factors/Patient Goals Review:      Goals and Risk Factor Review    Row Name 03/30/16 1247 04/04/16 1307 04/11/16 1254 04/24/16 0726 06/06/16 1333     Core Components/Risk Factors/Patient Goals Review   Personal Goals Review Sedentary;Increase Strength and Stamina Weight Management/Obesity;Hypertension  -- Sedentary;Increase Strength and Stamina;Develop more efficient breathing techniques such as purse lipped breathing and diaphragmatic breathing and practicing self-pacing with  activity.;Improve shortness of breath with ADL's;Increase knowledge of respiratory medications and ability to use respiratory devices properly.;Hypertension  --   Review Ms. JeFatous not doing much at home.  She is getting here three days a week for exercise.  She felt better after her last round of rehab, and hopes to do the same this time around. JeShavoneaid she used to weight 129-130 before she quit smoking. JeBariaid then she was 150lbs when she started  getting sick but now weight is up to 189lbs. Quenna's blood pressure is very good.  Ettel said she doesn't have any problems with her inhalers. Rand said she knows that she can not get better but she is trying not to get  worse. Continues to use pursed lip breathing and she is on supplemental oxgyen.  Ms Chay was given a spacer today for her Symbicort and Ventolin. She has a routine for the order she takes her inhalers and this has helped her take her tyvaso. She independently adjusts her oxygen with a higher flow on the treadmill. Ms Kubicki maintains acceptable BP's and knows the importance of lower BP for her [place]. Her exercise goals have increased , and she is very motivated to be more active , especially for her COPD management. Ms Qin is progressing with her exercise goals. She is maintaining acceptable BP and adjusts her oxygen accordingly with her different exercise goals. Ms Shaheen has a good understanding of her inhalers and her tyvaso SVN for her PAH.   Expected Outcomes We will go over home exercise with Romie Minus and continue to monitor for improvements in strength and stamina. To lose 2 lbs. Cont. stable blood pressure.  Continued use of her inhalers and pursed lip breathing techniques  -- Continue exercising regularly in [place] and continue her management of COPD.   [place] Name 06/25/16 1554             Core Components/Risk Factors/Patient Goals Review   Personal Goals Review Sedentary;Increase Strength and Stamina;Improve shortness of  breath with ADL's;Increase knowledge of respiratory medications and ability to use respiratory devices properly.;Develop more efficient breathing techniques such as purse lipped breathing and diaphragmatic breathing and practicing self-pacing with activity.;Hypertension       Review Ms Marrin is close to graduating. We talked today about her plans for exercise after [facility]. She had been in [employer], but will not consider FF in the winter, since she has a long drive and is worried about exercising with a large group. She states she can walk in her home. The purchase of an arm and foot pedometer is an option. She has a good understanding of her inhalers and has maintained acceptable blood pressure in [facility]. She is very competent with her oxygen and adjusting the flow rate for her different levels of excertion.       Expected Outcomes Continue exercise after [facility].          Core Components/Risk Factors/Patient Goals at Discharge (Final Review):      Goals and Risk Factor Review - 06/25/16 1554      Core Components/Risk Factors/Patient Goals Review   Personal Goals Review Sedentary;Increase Strength and Stamina;Improve shortness of breath with ADL's;Increase knowledge of respiratory medications and ability to use respiratory devices properly.;Develop more efficient breathing techniques such as purse lipped breathing and diaphragmatic breathing and practicing self-pacing with activity.;Hypertension   Review Ms Levengood is close to graduating. We talked today about her plans for exercise after [facility]. She had been in [employer], but will not consider FF in the winter, since she has a long drive and is worried about exercising with a large group. She states she can walk in her home. The purchase of an arm and foot pedometer is an option. She has a good understanding of her inhalers and has maintained acceptable blood pressure in [facility]. She is very competent with her oxygen and adjusting  the flow rate for her different levels of excertion.   Expected  Outcomes Continue exercise after [facility].      ITP Comments:     ITP Comments    Row Name 03/20/16 1251 04/12/16 1130 06/15/16 1148       ITP Comments Zilah reports that she had tried to exercise in the [facility] but her father coded upstairs in the hospital and died. Dru said she had not been back to exercise since then. Tiffini said that she knows she needs a hip surgery but she is not sure when the doctor can do it. We discussed that Pulm Rehab can get her strength up for her upcoming hip surgery.  Zoriah is progressing well with exercise. Attended [employer] education class        Comments: 30 day note review

## 2016-07-02 NOTE — Progress Notes (Signed)
Daily Session Note  Patient Details  Name: Kristen Montgomery MRN: [id number] Date of Birth: [birth year] Referring Provider:   Flowsheet Row Pulmonary Rehab from 03/20/2016 in [facility] Cardiac and Pulmonary Rehab  Referring Provider  [name]      Encounter Date: 07/02/2016  Check In:     Session Check In - 07/02/16 1255      Check-In   Location [facility]-Cardiac & Pulmonary Rehab   Staff Present Nyoka Cowden, RN, BSN, [place], BS, RRT, Respiratory Bertis Ruddy, BS, ACSM CEP, Exercise Physiologist;Bernarr Longsworth Oletta Darter, [place], ACSM CEP, Exercise Physiologist   Supervising physician immediately available to respond to emergencies [facility] immediately available ER MD   Physician(s) Dominic Pea and Paduchowski   Medication changes reported     No   Fall or balance concerns reported    No   Warm-up and Cool-down Performed as group-led instruction   Resistance Training Performed Yes   VAD Patient? No     VAD patient   Has back up controller? No     Pain Assessment   Currently in Pain? No/denies         Goals Met:  Proper associated with RPD/PD & O2 Sat [employer] with exercise equipment Exercise tolerated well Strength training completed today  Goals Unmet:  Not Applicable  Comments: Pt able to follow exercise prescription today without complaint.  Will continue to monitor for progression.    Dr. Emily Filbert is Medical Director for [place] and [facility].

## 2016-07-05 DIAGNOSIS — I129 Hypertensive chronic kidney disease with stage 1 through stage 4 chronic kidney disease, or unspecified chronic kidney disease: Secondary | ICD-10-CM | POA: Diagnosis not present

## 2016-07-05 DIAGNOSIS — E782 Mixed hyperlipidemia: Secondary | ICD-10-CM | POA: Diagnosis not present

## 2016-07-08 DIAGNOSIS — I270 Primary pulmonary hypertension: Secondary | ICD-10-CM | POA: Diagnosis not present

## 2016-07-12 DIAGNOSIS — E782 Mixed hyperlipidemia: Secondary | ICD-10-CM | POA: Diagnosis not present

## 2016-07-12 DIAGNOSIS — N183 Chronic kidney disease, stage 3 (moderate): Secondary | ICD-10-CM | POA: Diagnosis not present

## 2016-07-12 DIAGNOSIS — I129 Hypertensive chronic kidney disease with stage 1 through stage 4 chronic kidney disease, or unspecified chronic kidney disease: Secondary | ICD-10-CM | POA: Diagnosis not present

## 2016-07-12 DIAGNOSIS — J449 Chronic obstructive pulmonary disease, unspecified: Secondary | ICD-10-CM | POA: Diagnosis not present

## 2016-07-16 DIAGNOSIS — I270 Primary pulmonary hypertension: Secondary | ICD-10-CM | POA: Diagnosis not present

## 2016-07-18 ENCOUNTER — Telehealth: Payer: Self-pay | Admitting: *Deleted

## 2016-07-18 ENCOUNTER — Encounter: Payer: Self-pay | Admitting: *Deleted

## 2016-07-18 ENCOUNTER — Encounter: Payer: Medicare Other | Attending: Internal Medicine

## 2016-07-18 DIAGNOSIS — J449 Chronic obstructive pulmonary disease, unspecified: Secondary | ICD-10-CM | POA: Insufficient documentation

## 2016-07-18 DIAGNOSIS — I2721 Secondary pulmonary arterial hypertension: Secondary | ICD-10-CM

## 2016-07-18 DIAGNOSIS — I272 Pulmonary hypertension, unspecified: Secondary | ICD-10-CM | POA: Insufficient documentation

## 2016-07-18 NOTE — Telephone Encounter (Signed)
Called to check on Mrs. Kristen Montgomery.  Daughter has had pneumonia and not been able to drive her to class.  She hopes to return when it warms up.

## 2016-07-22 DIAGNOSIS — J449 Chronic obstructive pulmonary disease, unspecified: Secondary | ICD-10-CM | POA: Diagnosis not present

## 2016-07-25 ENCOUNTER — Encounter: Payer: Medicare Other | Admitting: Respiratory Therapy

## 2016-07-25 DIAGNOSIS — I2721 Secondary pulmonary arterial hypertension: Secondary | ICD-10-CM

## 2016-07-25 DIAGNOSIS — J449 Chronic obstructive pulmonary disease, unspecified: Secondary | ICD-10-CM

## 2016-07-25 DIAGNOSIS — I272 Pulmonary hypertension, unspecified: Secondary | ICD-10-CM | POA: Diagnosis not present

## 2016-07-25 NOTE — Progress Notes (Signed)
Daily Session Note  Patient Details  Name: Kristen Montgomery MRN: [id number] Date of Birth: 1951-07-01 Referring Provider:   April Manson Pulmonary Rehab from 03/20/2016 in [facility] Cardiac and Pulmonary Rehab  Referring Provider  Merrily Brittle Date: 07/25/2016  Check In:     Session Check In - 07/25/16 1129      Check-In   Location [facility]-Cardiac & Pulmonary Rehab   Staff Present Alberteen Sam, MA, ACSM RCEP, Exercise Physiologist;Patricia Surles RN BSN;Laureen Owens Shark, BS, RRT, Respiratory Therapist   Supervising physician immediately available to respond to emergencies [facility] immediately available ER MD   Physician(s) Drs. Darl Householder and Alfred Levins   Medication changes reported     No   Fall or balance concerns reported    No   Warm-up and Cool-down Performed as group-led Location manager Performed Yes   VAD Patient? No     VAD patient   Has back up controller? No     Pain Assessment   Currently in Pain? No/denies   Multiple Pain Sites No         Goals Met:  Proper associated with RPD/PD & O2 Sat [employer] with exercise equipment Using PLB without cueing & demonstrates good technique Exercise tolerated well Strength training completed today  Goals Unmet:  Not Applicable  Comments: Pt able to follow exercise prescription today without complaint.  Will continue to monitor for progression. Kristen Montgomery returned today after being out due to lack of transportation while her family members have been sick.  She had the opportunity to meet with Kristen Montgomery today.  We will hold off on her walk test until next week to give her a few days back in exercise.   Dr. Emily Filbert is Medical Director for [place] and [facility].

## 2016-07-27 ENCOUNTER — Encounter: Payer: Medicare Other | Admitting: *Deleted

## 2016-07-27 DIAGNOSIS — J449 Chronic obstructive pulmonary disease, unspecified: Secondary | ICD-10-CM | POA: Diagnosis not present

## 2016-07-27 DIAGNOSIS — I2721 Secondary pulmonary arterial hypertension: Secondary | ICD-10-CM

## 2016-07-27 DIAGNOSIS — I272 Pulmonary hypertension, unspecified: Secondary | ICD-10-CM | POA: Diagnosis not present

## 2016-07-27 NOTE — Progress Notes (Signed)
Daily Session Note  Patient Details  Name: Kristen Montgomery MRN: [id number] Date of Birth: 11-14-1950 Referring Provider:   April Manson Pulmonary Rehab from 03/20/2016 in [facility] Cardiac and Pulmonary Rehab  Referring Provider  [name]      Encounter Date: 07/27/2016  Check In:     Session Check In - 07/27/16 1156      Check-In   Location [facility]-Cardiac & Pulmonary Rehab   Staff Present Heath Lark, RN, BSN, CCRP;Jessica Luan [place], [place], ACSM RCEP, Exercise Physiologist;Dani Danis RN BSN   Supervising physician immediately available to respond to emergencies [facility] immediately available ER MD   Physician(s) Drs. McShane & Kinner   Medication changes reported     No   Fall or balance concerns reported    No   Warm-up and Cool-down Performed as group-led Location manager Performed Yes   VAD Patient? No     VAD patient   Has back up controller? No     Pain Assessment   Currently in Pain? No/denies   Multiple Pain Sites No         Goals Met:  Proper associated with RPD/PD & O2 Sat [employer] with exercise equipment Using PLB without cueing & demonstrates good technique Exercise tolerated well No report of cardiac concerns or symptoms Strength training completed today  Goals Unmet:  Not Applicable  Comments: Pt able to follow exercise prescription today without complaint.  Will continue to monitor for progression.    Dr. Emily Filbert is Medical Director for [place] and [facility].

## 2016-07-30 ENCOUNTER — Encounter: Payer: Self-pay | Admitting: Respiratory Therapy

## 2016-07-30 DIAGNOSIS — I2721 Secondary pulmonary arterial hypertension: Secondary | ICD-10-CM

## 2016-07-30 DIAGNOSIS — J449 Chronic obstructive pulmonary disease, unspecified: Secondary | ICD-10-CM

## 2016-07-30 NOTE — Progress Notes (Signed)
Pulmonary Individual Treatment Plan  Patient Details  Name: Kristen Montgomery MRN: [id number] Date of Birth: 06/10/1951 Referring Provider:   Flowsheet Row Pulmonary Rehab from 03/20/2016 in [facility] Cardiac and Pulmonary Rehab  Referring Provider  [name]      Initial Encounter Date:  Flowsheet Row Pulmonary Rehab from 03/20/2016 in [facility] Cardiac and Pulmonary Rehab  Date  03/20/16  Referring Provider  [name]      Visit Diagnosis: PAH (pulmonary artery hypertension)  COPD, severe ([place])  Patient'Montgomery Home Medications on Admission:  Current Outpatient Prescriptions:    albuterol (ACCUNEB) 0.63 MG/3ML nebulizer solution, Inhale 1 ampule by nebulization every six (6) hours as needed for wheezing., Disp: , Rfl:    albuterol (VENTOLIN HFA) 108 (90 Base) MCG/ACT inhaler, INHALE 2 PUFFS BY MOUTH EVERY 4 TO 6 HOURS AS NEEDED FOR SHORTNESS OF BREATH, COUGH OR WHEEZE, Disp: , Rfl:    aspirin EC 81 MG tablet, Take by mouth., Disp: , Rfl:    carvedilol (COREG) 3.125 MG tablet, Take 3.125 mg by mouth., Disp: , Rfl:    docusate sodium (COLACE) 100 MG capsule, Take 100 mg by mouth., Disp: , Rfl:    furosemide (LASIX) 40 MG tablet, Take 40 mg by mouth., Disp: , Rfl:    losartan (COZAAR) 50 MG tablet, Take 50 mg by mouth 1 day or 1 dose., Disp: , Rfl:    omeprazole (PRILOSEC) 20 MG capsule, Take 20 mg by mouth., Disp: , Rfl:    potassium chloride SA (K-DUR,KLOR-CON) 20 MEQ tablet, Take 10 mEq by mouth., Disp: , Rfl:    tiotropium (SPIRIVA HANDIHALER) 18 MCG inhalation capsule, INHALE 1 CAPSULE VIA HANDIHALER ONCE DAILY AT THE SAME TIME EVERY DAY, Disp: , Rfl:    traZODone (DESYREL) 50 MG tablet, TAKE 1 TABLET BY MOUTH AT BEDTIME AS NEEDED, Disp: , Rfl:    Treprostinil (TYVASO) 0.6 MG/ML SOLN, Inhale 3-9 breaths four times daily., Disp: , Rfl:   Past Medical History: No past medical history on file.  Tobacco Use: History  Smoking Status   Not on file  Smokeless Tobacco   Not on file     Labs: Recent Review Flowsheet Data    There is no flowsheet data to display.       ADL UCSD:     Pulmonary Assessment Scores    Row Name 03/20/16 1435 05/11/16 1324 06/29/16 1150     ADL UCSD   ADL Phase Entry Mid Exit   SOB Score total 68 74 86   Rest 0 0 0   Walk _0 Stairs _1 Bath _2 Dress _3 Shop 3 0 3      Pulmonary Function Assessment:     Pulmonary Function Assessment - 03/20/16 1433      Initial Spirometry Results   FVC% 31 %   FEV1% 27 %   FEV1/FVC Ratio 68.6   Comments Test date 03/20/2016      Post Bronchodilator Spirometry Results   FVC% 46 %   FEV1% 29 %   FEV1/FVC Ratio 62.47     Breath   Bilateral Breath Sounds Decreased   Shortness of Breath Yes;Fear of Shortness of Breath;Limiting activity      Exercise Target Goals:    Exercise Program Goal: Individual exercise prescription set with THRR, safety & activity barriers. Participant demonstrates ability to understand and report RPE using BORG scale, to self-measure pulse accurately, and to  acknowledge the importance of the exercise prescription.  Exercise Prescription Goal: Starting with aerobic activity 30 plus minutes a day, 3 days per week for initial exercise prescription. Provide home exercise prescription and guidelines that participant acknowledges understanding prior to discharge.  Activity Barriers & Risk Stratification:     Activity Barriers & Cardiac Risk Stratification - 03/20/16 1432      Activity Barriers & Cardiac Risk Stratification   Activity Barriers Shortness of Breath;Deconditioning   Cardiac Risk Stratification Moderate      6 Minute Walk:     6 Minute Walk    Row Name 03/20/16 1332 05/11/16 1331 05/11/16 1336     6 Minute Walk   Phase  -- Mid Program Mid Program   Distance 340 feet  -- 270 feet   Distance % Change  --  -- -20.6 %   Walk Time 3 minutes  -- 2.02 minutes   # of Rest Breaks 3  -- 3  1:21, 20 sec, 2:18   MPH 1.28  --  1.52   METS 1.17  -- 2.2   RPE 13  -- 17   Perceived Dyspnea  4  -- 4   VO2 Peak 4.11  -- 2.95   Symptoms No  -- Yes (comment)   Comments  --  -- SOB, fatigue   Resting HR 96 bpm  -- 96 bpm   Resting BP 128/60  -- 122/62   Max Ex. HR 116 bpm  -- 114 bpm   Max Ex. BP 142/62  -- 126/74   2 Minute Post BP 104/64  -- 122/70     Interval HR   Baseline HR 96  -- 96   1 Minute HR 113  -- 114   2 Minute HR 117  -- 112   3 Minute HR 114  -- 114   4 Minute HR  --  -- 106   5 Minute HR 116  -- 98   6 Minute HR  --  -- 101   2 Minute Post HR 100  -- 101   Interval Heart Rate? Yes  -- Yes     Interval Oxygen   Interval Oxygen? Yes  -- Yes   Baseline Oxygen Saturation % 93 %  -- 95 %   Baseline Liters of Oxygen 4 L  -- 3 L   1 Minute Oxygen Saturation % 93 %  -- 91 %  57 sec 83%, 1:13 91%   1 Minute Liters of Oxygen 4 L  -- 4 L   2 Minute Oxygen Saturation % 88 %  -- 87 %   2 Minute Liters of Oxygen 4 L  -- 4 L   3 Minute Oxygen Saturation % 93 %  -- 85 %   3 Minute Liters of Oxygen 4 L  -- 4 L   4 Minute Oxygen Saturation %  --  -- 87 %  3:26 81%   4 Minute Liters of Oxygen 4 L  -- 4 L   5 Minute Oxygen Saturation % 86 %  -- 93 %   5 Minute Liters of Oxygen 4 L  -- 4 L   6 Minute Oxygen Saturation %  --  -- 87 %  7:00 85%   6 Minute Liters of Oxygen 4 L  -- 4 L   2 Minute Post Oxygen Saturation % 100 %  -- 90 %   2 Minute Post Liters of Oxygen 4 L  -- 4 L  Initial Exercise Prescription:     Initial Exercise Prescription - 03/20/16 1300      Date of Initial Exercise RX and Referring Provider   Date 03/20/16   Referring Provider [name]     Oxygen   Oxygen Continuous   Liters 4     Treadmill   MPH 0.5   Grade 0   Minutes 15  2 then rest and repeat     NuStep   Level 1   Minutes 15  2 then rest and repeat   METs 1     Arm Ergometer   Level 1   Minutes 15  2 min then rest and repeat     T5 Nustep   Level 1   Minutes 15  2 min then rest and repeat    METs 1     Prescription Details   Frequency (times per week) 3   Duration Progress to 30 minutes of continuous aerobic without signs/symptoms of physical distress     Intensity   THRR 40-80% of Max Heartrate 120-144   Ratings of Perceived Exertion 11-13   Perceived Dyspnea 0-4     Progression   Progression Continue to progress workloads to maintain intensity without signs/symptoms of physical distress.     Resistance Training   Training Prescription Yes   Weight 1   Reps 10-15      Perform Capillary Blood Glucose checks as needed.  Exercise Prescription Changes:     Exercise Prescription Changes    Row Name 03/26/16 1300 04/12/16 1100 04/24/16 1500 05/09/16 1600 05/23/16 1500     Exercise Review   Progression  --  -- Yes Yes Yes     Response to Exercise   Blood Pressure (Admit) 130/66 124/78 98/60 110/54 142/70   Blood Pressure (Exercise) 130/72 130/78 126/70 130/66 146/74   Blood Pressure (Exit) 120/88 120/72 100/60 112/72 122/78   Heart Rate (Admit) 92 bpm 91 bpm 100 bpm 83 bpm 89 bpm   Heart Rate (Exercise) 117 bpm 117 bpm 131 bpm 113 bpm 117 bpm   Heart Rate (Exit) 88 bpm 86 bpm 96 bpm 92 bpm 95 bpm   Oxygen Saturation (Admit) 94 % 95 % 91 % 92 % 92 %   Oxygen Saturation (Exercise) 86 %  moved up to 6L - O2 increased to 90 90 % 88 % 87 % 89 %   Oxygen Saturation (Exit) 98 % 98 % 99 % 98 % 97 %   Rating of Perceived Exertion (Exercise) _0 Perceived Dyspnea (Exercise) _1 Symptoms  --  -- none none none   Comments  --  --  --  -- Home Exercise Guidelines given 05/11/16   Duration Progress to 30 minutes of continuous aerobic without signs/symptoms of physical distress Progress to 45 minutes of aerobic exercise without signs/symptoms of physical distress Progress to 45 minutes of aerobic exercise without signs/symptoms of physical distress Progress to 45 minutes of aerobic exercise without signs/symptoms of physical distress Progress to 45 minutes  of aerobic exercise without signs/symptoms of physical distress   Intensity _2      Progression   Progression Continue to progress workloads to maintain intensity without signs/symptoms of physical distress. Continue to progress workloads to maintain intensity without signs/symptoms of physical distress. Continue to progress workloads to maintain intensity without signs/symptoms of physical distress. Continue to progress workloads to maintain intensity  without signs/symptoms of physical distress. Continue to progress workloads to maintain intensity without signs/symptoms of physical distress.   Average METs  --  -- 11.63 1.9 1.67     Resistance Training   Training Prescription Yes  -- Yes Yes Yes   Weight 1  -- 1 2 lbs 2 lbs   Reps 10-15  -- 10-15 10-12 10-12     Interval Training   Interval Training No  -- No No No     Oxygen   Oxygen _0    Liters _1 Treadmill   MPH 0.8 0.8 0.8 0.5 0.8   Grade 0 0 0 0 0   Minutes 15  2/2/2 15  6/4/_2 min x3 16  6 min 5 min 5 min 15     NuStep   Level _3 Minutes 15  5/3/_4 METs 1  -- 2.3 2.1 2.4     Biostep-RELP   Level  -- _5 Minutes  -- _6 METs  --  -- _7 Home Exercise Plan   Plans to continue exercise at  --  --  --  -- Home  walking, chair exercises   Frequency  --  --  --  -- Add 2 additional days to program exercise sessions.   Row Name 06/05/16 1600 06/19/16 1500 07/04/16 1500         Exercise Review   Progression Yes Yes Yes       Response to Exercise   Blood Pressure (Admit) 142/62 122/78 106/62     Blood Pressure (Exercise) 126/74 138/72 120/66     Blood Pressure (Exit) 112/60 124/60 118/74     Heart Rate (Admit) 89 bpm 101 bpm 91 bpm     Heart Rate (Exercise) 115 bpm 114 bpm 120 bpm     Heart Rate (Exit) 99 bpm 96 bpm 96 bpm      Oxygen Saturation (Admit) 96 % 91 % 94 %     Oxygen Saturation (Exercise) 90 % 88 % 88 %     Oxygen Saturation (Exit) 96 % 93 % 98 %     Rating of Perceived Exertion (Exercise) _8 Perceived Dyspnea (Exercise) _9 Symptoms none none none     Comments Home Exercise Guidelines given 05/11/16 Home Exercise Guidelines given 05/11/16 Home Exercise Guidelines given 05/11/16     Duration Progress to 45 minutes of aerobic exercise without signs/symptoms of physical distress Progress to 45 minutes of aerobic exercise without signs/symptoms of physical distress Progress to 45 minutes of aerobic exercise without signs/symptoms of physical distress     Intensity THRR unchanged THRR unchanged THRR unchanged       Progression   Progression Continue to progress workloads to maintain intensity without signs/symptoms of physical distress. Continue to progress workloads to maintain intensity without signs/symptoms of physical distress. Continue to progress workloads to maintain intensity without signs/symptoms of physical distress.     Average METs 1.83 1.43 2.1       Resistance Training   Training Prescription Yes Yes Yes     Weight 2 lbs 2 lbs 3 lbs     Reps 10-12 10-12 10-12       Interval Training   Interval  Training No No No       Oxygen   Oxygen Continuous Continuous Continuous     Liters 4 4 4-6  6L on treadmill       Treadmill   MPH 0.8 0.8 0.8     Grade 0 0 0     Minutes _0 NuStep   Level _1 Minutes _2 METs 1.8 1.6 2.6       Biostep-RELP   Level _3 Minutes _4 METs _5 Home Exercise Plan   Plans to continue exercise at Home  walking, chair exercises Home  walking, chair exercises Home  walking, chair exercises     Frequency Add 2 additional days to program exercise sessions. Add 2 additional days to program exercise sessions. Add 2 additional days to program exercise sessions.        Exercise  Comments:     Exercise Comments    Row Name 03/20/16 1344 03/26/16 1325 04/12/16 1130 04/24/16 1553 05/09/16 1601   Exercise Comments Kristen Montgomery uses a wheel chair - she did not seem to have trouble with balance other than being very deconditioned and not being able to walk very far. Kristen Montgomery did well for her first day of exercise.  Her sats dropped to 86 at 2 L so she uses 6L contiuous during exercise. Kristen Montgomery is progressing well with exercise. Kristen Montgomery is now getting 5 min intervals on the treadmill consistently.  She has also moved down to join the class when on the treadmill, not worrying about how she looks.  We will continue to monitor her progression. Kristen Montgomery is almost half way through the program.  She is doing great.  We wil continue to monitor her progression.   [place] Name 05/11/16 1343 05/18/16 1254 05/23/16 1405 06/05/16 1634 06/19/16 1541   Exercise Comments Reviewed home exercise with pt today.  Pt plans to use pedaler at home for exercise.  Reviewed THR, pulse, RPE, sign and symptoms, and when to call 911 or MD.  Also discussed weather considerations and indoor options.  Pt voiced understanding. Pt desaturated on the treadmill to 84% on 4L O2.  Pt was using pursed lip breathing on her own without queing, O2 was turned up to 6L and pt took a rest break.  After interventions, O2 sat improved to 98% on 6L.  On the NuStep, pt self-adjusted her O2 down to 3L.  Pt remained on 3L O2 on NuStep and Biostep with saturations at 94%.  Chalet continues to come to rehab for exercise.  It gets her out of the house and moving.  She is doing some on her own at home.  We will continue to encourage her to come to rehab and to exercise at home. Cinthya has been doing well in rehab.  She is getting 10 min straight on the treadmill!!! We will continue to monitor her progression. Gianina continues to do well in rehab.  She has done a full 15 min on the treadmill!  We will continue to monitor her progression.   [place] Name 06/22/16 1210 06/27/16  1232 07/04/16 1506 07/18/16 1453 07/25/16 1239   Exercise Comments Reviewed METs average and discussed progression with pt today. Keyonna did her full 15 minutes on the treadmill without stopping today!! Kristen Montgomery did  15 full minutes on the treadmill again.  She was quite proud of herself for this accomplishment.  She is nearing graduation.  We will continue to watch her progression. Kearia has been out since 07/02/16 Zyra returned today after being out due to lack of transportation while her family members have been sick.  She had the opportunity to meet with Juliann Pulse today.  We will hold off on her walk test until next week to give her a few days back in exercise.      Discharge Exercise Prescription (Final Exercise Prescription Changes):     Exercise Prescription Changes - 07/04/16 1500      Exercise Review   Progression Yes     Response to Exercise   Blood Pressure (Admit) 106/62   Blood Pressure (Exercise) 120/66   Blood Pressure (Exit) 118/74   Heart Rate (Admit) 91 bpm   Heart Rate (Exercise) 120 bpm   Heart Rate (Exit) 96 bpm   Oxygen Saturation (Admit) 94 %   Oxygen Saturation (Exercise) 88 %   Oxygen Saturation (Exit) 98 %   Rating of Perceived Exertion (Exercise) 13   Perceived Dyspnea (Exercise) 4   Symptoms none   Comments Home Exercise Guidelines given 05/11/16   Duration Progress to 45 minutes of aerobic exercise without signs/symptoms of physical distress   Intensity THRR unchanged     Progression   Progression Continue to progress workloads to maintain intensity without signs/symptoms of physical distress.   Average METs 2.1     Resistance Training   Training Prescription Yes   Weight 3 lbs   Reps 10-12     Interval Training   Interval Training No     Oxygen   Oxygen Continuous   Liters 4-6  6L on treadmill     Treadmill   MPH 0.8   Grade 0   Minutes 15     NuStep   Level 3   Minutes 15   METs 2.6     Biostep-RELP   Level 2   Minutes 15   METs 2      Home Exercise Plan   Plans to continue exercise at Home  walking, chair exercises   Frequency Add 2 additional days to program exercise sessions.       Nutrition:  Target Goals: Understanding of nutrition guidelines, daily intake of sodium <1533m, cholesterol <2025m calories 30% from fat and 7% or less from saturated fats, daily to have 5 or more servings of fruits and vegetables.  Biometrics:     Pre Biometrics - 03/20/16 1331      Pre Biometrics   Height _0  (1.499 m)   Weight 188 lb 6.4 oz (85.5 kg)   Waist Circumference 44.5 inches   Hip Circumference 51.13 inches   Waist to Hip Ratio 0.87 %   BMI (Calculated) 38.1       Nutrition Therapy Plan and Nutrition Goals:   Nutrition Discharge: Rate Your Plate Scores:   Psychosocial: Target Goals: Acknowledge presence or absence of depression, maximize coping skills, provide positive support system. Participant is able to verbalize types and ability to use techniques and skills needed for reducing stress and depression.  Initial Review & Psychosocial Screening:     Initial Psych Review & Screening - 03/20/16 1253      Initial Review   Current issues with Current Stress Concerns   Comments Kristen Montgomery that she had tried to exercise in the [facility] but her father coded upstairs in the  hospital and died. Kristen Montgomery said she had not been back to exercise since then. Kristen Montgomery said that she knows she needs a hip surgery but she is not sure when the doctor can do it. We discussed that Pulm Rehab can get her strength up for her upcoming hip surgery.      Family Dynamics   Good Support System? Yes   Concerns Recent loss of significant other   Comments Her father died since she has attended Pulm REhab last     Barriers   Psychosocial barriers to participate in program The patient should benefit from training in stress management and relaxation.     Screening Interventions   Interventions Encouraged to exercise       Quality of Life Scores:     Quality of Life - 06/29/16 1151      Quality of Life Scores   Health/Function Pre 20.93 %   Health/Function Post 20.4 %   Health/Function % Change -2.53 %   Socioeconomic Pre 21 %   Socioeconomic Post 27.43 %   Socioeconomic % Change  30.62 %   Psych/Spiritual Pre 21 %   Psych/Spiritual Post 27.43 %   Psych/Spiritual % Change 30.62 %   Family Pre 21 %   Family Post 30 %   Family % Change 42.86 %   GLOBAL Pre 20.97 %   GLOBAL Post 24.71 %   GLOBAL % Change 17.84 %      PHQ-9: Recent Review Flowsheet Data    Depression screen [facility] 2/9 06/29/2016 03/20/2016   Decreased Interest 0 0   Down, Depressed, Hopeless 0 0   PHQ - 2 Score 0 0   Altered sleeping 0 0   Tired, decreased energy 0 0   Change in appetite 0 0   Feeling bad or failure about yourself  0 0   Trouble concentrating 0 0   Moving slowly or fidgety/restless 0 0   Suicidal thoughts 0 0   PHQ-9 Score 0 0      Psychosocial Evaluation and Intervention:     Psychosocial Evaluation - 07/25/16 1233      Discharge Psychosocial Assessment & Intervention   Comments Counselor met with Kristen Montgomery today to discuss her discharge.  She reports progress in that she "feels better" and sometimes breathes better.  She has a current stressor discovering her insurance will not cover some of the medications she has been on and they are too expensive for her to cover.  She is proactively searching for ways/funding to continue these.  Ms. Kristen Montgomery reports she has not been sleeping well lately and may need to start her sleep medication back soon.  Her support systems are constant although with the cold and flu season she has been isolating herself more for health reasons.  She plans to work out at home following discharge from this program and will also check back into a [facility] locally once the weather warms up.  Counselor commended Kristen Montgomery for her commitment to exercise and her progress made.  Counselor also  commended her for her tenacity in finding solutions to her medication needs.        Psychosocial Re-Evaluation:     Psychosocial Re-Evaluation    [place] Name 05/07/16 1239 06/06/16 1236 06/06/16 1340         Psychosocial Re-Evaluation   Comments Counselor follow up with Kristen Montgomery today.  She reports having increased energy and walking a little better since coming in to this program.  She is now sleeping better as well and is no longer on the medications prescribed earlier.  Ms. Kristen Montgomery states her mood continues to be stable and positive and she is accomplishing many of her goals she set when she began this program.  Counselor commended her on her progress made. Kristen Montgomery psychosocial assessment reveals no barriers at this time to participation in Pulmonary Rehab.  she has good family and friend support that encourages Kristen Montgomery to participate in [facility] and progress with Her goals.  Kristen Montgomery concerns are monitored, but she has acknowledge that attending the program has helped to maintain quality life with improved mobility, self-care, and emotional and financial stability.  Annesha is commended for regular attendance and self-motivation to improve her pulmonary disease management. Kristen Montgomery psychosocial assessment reveals no barriers at this time to participation in Pulmonary Rehab.  she has good family and friend support that encourages Kristen Montgomery to participate in [place] and progress with Her goals.  Kristen Montgomery concerns are monitored, but she has acknowledge that attending the program has helped to maintain quality life with improved mobility, self-care, and emotional and financial stability.  Kristen Montgomery is commended for regular attendance and self-motivation to improve Her pulmonary disease management.       Education: Education Goals: Education classes will be provided on a weekly basis, covering required topics. Participant will state understanding/return demonstration of topics presented.  Learning Barriers/Preferences:     Learning  Barriers/Preferences - 03/20/16 1432      Learning Barriers/Preferences   Learning Barriers None   Learning Preferences Group Instruction;Individual Instruction;Pictoral;Skilled Demonstration;Verbal Instruction;Video;Written Material      Education Topics: Initial Evaluation Education: - Verbal, written and demonstration of respiratory meds, RPE/PD scales, oximetry and breathing techniques. Instruction on use of nebulizers and MDIs: cleaning and proper use, rinsing mouth with steroid doses and importance of monitoring MDI activations. Flowsheet Row Pulmonary Rehab from 07/25/2016 in [facility] Cardiac and Pulmonary Rehab  Date  03/20/16  Educator  LB  Instruction Review Code  2- meets goals/outcomes      General Nutrition Guidelines/Fats and Fiber: -Group instruction provided by verbal, written material, models and posters to present the general guidelines for heart healthy nutrition. Gives an explanation and review of dietary fats and fiber.   Controlling Sodium/Reading Food Labels: -Group verbal and written material supporting the discussion of sodium use in heart healthy nutrition. Review and explanation with models, verbal and written materials for utilization of the food label.   Exercise Physiology & Risk Factors: - Group verbal and written instruction with models to review the exercise physiology of the cardiovascular system and associated critical values. Details cardiovascular disease risk factors and the goals associated with each risk factor. Flowsheet Row Pulmonary Rehab from 07/25/2016 in [facility] Cardiac and Pulmonary Rehab  Date  05/30/16  Educator  AS  Instruction Review Code  2- meets goals/outcomes      Aerobic Exercise & Resistance Training: - Gives group verbal and written discussion on the health impact of inactivity. On the components of aerobic and resistive training programs and the benefits of this training and how to safely progress through these programs. Flowsheet  Row Pulmonary Rehab from 07/25/2016 in [facility] Cardiac and Pulmonary Rehab  Date  04/04/16  Educator  JH/AS  Instruction Review Code  2- meets goals/outcomes      Flexibility, Balance, General Exercise Guidelines: - Provides group verbal and written instruction on the benefits of flexibility and balance training programs. Provides general exercise guidelines with specific guidelines to those with heart or  lung disease. Demonstration and skill practice provided.   Stress Management: - Provides group verbal and written instruction about the health risks of elevated stress, cause of high stress, and healthy ways to reduce stress. Flowsheet Row Pulmonary Rehab from 07/25/2016 in [facility] Cardiac and Pulmonary Rehab  Date  07/25/16  Educator  [facility]  Instruction Review Code  2- meets goals/outcomes      Depression: - Provides group verbal and written instruction on the correlation between heart/lung disease and depressed mood, treatment options, and the stigmas associated with seeking treatment. Flowsheet Row Pulmonary Rehab from 07/25/2016 in [facility] Cardiac and Pulmonary Rehab  Date  06/18/16  Educator  [facility]  Instruction Review Code  2- meets goals/outcomes      Exercise & Equipment Safety: - Individual verbal instruction and demonstration of equipment use and safety with use of the equipment. Flowsheet Row Pulmonary Rehab from 07/25/2016 in [facility] Cardiac and Pulmonary Rehab  Date  03/26/16  Educator  AS  Instruction Review Code  2- meets goals/outcomes      Infection Prevention: - Provides verbal and written material to individual with discussion of infection control including proper hand washing and proper equipment cleaning during exercise session. Flowsheet Row Pulmonary Rehab from 07/25/2016 in [facility] Cardiac and Pulmonary Rehab  Date  03/26/16  Educator  AS  Instruction Review Code  2- meets goals/outcomes      Falls Prevention: - Provides verbal and written material to individual with  discussion of falls prevention and safety. Flowsheet Row Pulmonary Rehab from 07/25/2016 in [facility] Cardiac and Pulmonary Rehab  Date  03/20/16  Educator  C. Enterkin Therapist, sports  Instruction Review Code  1- partially meets, needs review/practice      Diabetes: - Individual verbal and written instruction to review signs/symptoms of diabetes, desired ranges of glucose level fasting, after meals and with exercise. Advice that pre and post exercise glucose checks will be done for 3 sessions at entry of program.   Chronic Lung Diseases: - Group verbal and written instruction to review new updates, new respiratory medications, new advancements in procedures and treatments. Provide informative websites and "800" numbers of self-education. Flowsheet Row Pulmonary Rehab from 07/25/2016 in [facility] Cardiac and Pulmonary Rehab  Date  06/13/16  Educator  LB  Instruction Review Code  2- meets goals/outcomes      Lung Procedures: - Group verbal and written instruction to describe testing methods done to diagnose lung disease. Review the outcome of test results. Describe the treatment choices: Pulmonary Function Tests, ABGs and oximetry.   Energy Conservation: - Provide group verbal and written instruction for methods to conserve energy, plan and organize activities. Instruct on pacing techniques, use of adaptive equipment and posture/positioning to relieve shortness of breath. Flowsheet Row Pulmonary Rehab from 07/25/2016 in [facility] Cardiac and Pulmonary Rehab  Date  04/25/16  Educator  Fredderick Erb, EP  Instruction Review Code  2- meets goals/outcomes      Triggers: - Group verbal and written instruction to review types of environmental controls: home humidity, furnaces, filters, dust mite/pet prevention, HEPA vacuums. To discuss weather changes, air quality and the benefits of nasal washing. Flowsheet Row Pulmonary Rehab from 07/25/2016 in [facility] Cardiac and Pulmonary Rehab  Date  03/26/16  Educator  LB   Instruction Review Code  2- meets goals/outcomes      Exacerbations: - Group verbal and written instruction to provide: warning signs, infection symptoms, calling MD promptly, preventive modes, and value of vaccinations. Review: effective airway clearance, coughing and/or vibration  techniques. Create an Sports administrator.   Oxygen: - Individual and group verbal and written instruction on oxygen therapy. Includes supplement oxygen, available portable oxygen systems, continuous and intermittent flow rates, oxygen safety, concentrators, and Medicare reimbursement for oxygen.   Respiratory Medications: - Group verbal and written instruction to review medications for lung disease. Drug class, frequency, complications, importance of spacers, rinsing mouth after steroid MDI'Montgomery, and proper cleaning methods for nebulizers.   AED/CPR: - Group verbal and written instruction with the use of models to demonstrate the basic use of the AED with the basic ABC'Montgomery of resuscitation. Flowsheet Row Pulmonary Rehab from 07/25/2016 in [facility] Cardiac and Pulmonary Rehab  Date  05/25/16  Educator  CE  Instruction Review Code  2- meets goals/outcomes      Breathing Retraining: - Provides individuals verbal and written instruction on purpose, frequency, and proper technique of diaphragmatic breathing and pursed-lipped breathing. Applies individual practice skills. Flowsheet Row Pulmonary Rehab from 07/25/2016 in [facility] Cardiac and Pulmonary Rehab  Date  03/20/16  Educator  C. EnterkinRN  Instruction Review Code  2- meets Designer, fashion/clothing and Physiology of the Lungs: - Group verbal and written instruction with the use of models to provide basic lung anatomy and physiology related to function, structure and complications of lung disease.   Heart Failure: - Group verbal and written instruction on the basics of heart failure: signs/symptoms, treatments, explanation of ejection fraction, enlarged heart and  cardiomyopathy. Flowsheet Row Pulmonary Rehab from 07/25/2016 in [facility] Cardiac and Pulmonary Rehab  Date  06/29/16  Educator  SB  Instruction Review Code  2- meets goals/outcomes      Sleep Apnea: - Individual verbal and written instruction to review Obstructive Sleep Apnea. Review of risk factors, methods for diagnosing and types of masks and machines for OSA.   Anxiety: - Provides group, verbal and written instruction on the correlation between heart/lung disease and anxiety, treatment options, and management of anxiety. Flowsheet Row Pulmonary Rehab from 07/25/2016 in [facility] Cardiac and Pulmonary Rehab  Date  07/25/16  Educator  [facility]  Instruction Review Code  2- Meets goals/outcomes      Relaxation: - Provides group, verbal and written instruction about the benefits of relaxation for patients with heart/lung disease. Also provides patients with examples of relaxation techniques.   Knowledge Questionnaire Score:     Knowledge Questionnaire Score - 06/29/16 1150      Knowledge Questionnaire Score   Pre Score 9/10   Post Score 7/10       Core Components/Risk Factors/Patient Goals at Admission:     Personal Goals and Risk Factors at Admission - 03/20/16 1438      Core Components/Risk Factors/Patient Goals on Admission   Develop more efficient breathing techniques such as purse lipped breathing and diaphragmatic breathing; and practicing self-pacing with activity Yes   Intervention Provide education, demonstration and support about specific breathing techniuqes utilized for more efficient breathing. Include techniques such as pursed lipped breathing, diaphragmatic breathing and self-pacing activity.   Expected Outcomes Short Term: Participant will be able to demonstrate and use breathing techniques as needed throughout daily activities.   Increase knowledge of respiratory medications and ability to use respiratory devices properly  Yes   Intervention Provide education and  demonstration as needed of appropriate use of medications, inhalers, and oxygen therapy.   Expected Outcomes Short Term: Achieves understanding of medications use. Understands that oxygen is a medication prescribed by physician. Demonstrates appropriate use of inhaler and oxygen  therapy.   Hypertension Yes   Intervention Provide education on lifestyle modifcations including regular physical activity/exercise, weight management, moderate sodium restriction and increased consumption of fresh fruit, vegetables, and low fat dairy, alcohol moderation, and smoking cessation.;Monitor prescription use compliance.   Expected Outcomes Short Term: Continued assessment and intervention until BP is < 140/48m HG in hypertensive participants. < 130/851mHG in hypertensive participants with diabetes, heart failure or chronic kidney disease.;Long Term: Maintenance of blood pressure at goal levels.      Core Components/Risk Factors/Patient Goals Review:      Goals and Risk Factor Review    Row Name 03/30/16 1247 04/04/16 1307 04/11/16 1254 04/24/16 0726 06/06/16 1333     Core Components/Risk Factors/Patient Goals Review   Personal Goals Review Sedentary;Increase Strength and Stamina Weight Management/Obesity;Hypertension  -- Sedentary;Increase Strength and Stamina;Develop more efficient breathing techniques such as purse lipped breathing and diaphragmatic breathing and practicing self-pacing with activity.;Improve shortness of breath with ADL'Montgomery;Increase knowledge of respiratory medications and ability to use respiratory devices properly.;Hypertension  --   Review Ms. Kristen Montgomery not doing much at home.  She is getting here three days a week for exercise.  She felt better after her last round of rehab, and hopes to do the same this time around. Kristen Montgomery she used to weight 129-130 before she quit smoking. Kristen Montgomery then she was 150lbs when she started getting sick but now weight is up to 189lbs. Kristen Montgomery blood pressure is  very good.  JeLoreaaid she doesn't have any problems with her inhalers. JeJonnyaid she knows that she can not get better but she is trying not to get  worse. Continues to use pursed lip breathing and she is on supplemental oxgyen.  Ms ShPunchas given a spacer today for her Symbicort and Ventolin. She has a routine for the order she takes her inhalers and this has helped her take her tyvaso. She independently adjusts her oxygen with a higher flow on the treadmill. Ms ShHouseworthaintains acceptable BP'Montgomery and knows the importance of lower BP for her [place] exercise goals have increased , and she is very motivated to be more active , especially for her COPD management. Ms ShDalportos progressing with her exercise goals. She is maintaining acceptable BP and adjusts her oxygen accordingly with her different exercise goals. Ms ShShadowensas a good understanding of her inhalers and her tyvaso SVN for her PAH.   Expected Outcomes We will go over home exercise with JeRomie Minusnd continue to monitor for improvements in strength and stamina. To lose 2 lbs. Cont. stable blood pressure.  Continued use of her inhalers and pursed lip breathing techniques  -- Continue exercising regularly [place] continue her management of COPD.   RoTat Momoliame 06/25/16 1554 07/25/16 1517           Core Components/Risk Factors/Patient Goals Review   Personal Goals Review Sedentary;Increase Strength and Stamina;Improve shortness of breath with ADL'Montgomery;Increase knowledge of respiratory medications and ability to use respiratory devices properly.;Develop more efficient breathing techniques such as purse lipped breathing and diaphragmatic breathing and practicing self-pacing with activity.;Hypertension Sedentary;Increase Strength and Stamina;Develop more efficient breathing techniques such as purse lipped breathing and diaphragmatic breathing and practicing self-pacing with activity.;Improve shortness of breath with ADL'Montgomery;Increase knowledge of  respiratory medications and ability to use respiratory devices properly.      Review Ms ShMogles close to graduating. We talked today about her plans for exercise after [facility]. She had been in [employer], but will not consider FF in the winter, since she has a long drive and is worried about exercising with a large group. She states she can walk in her home. The purchase of an arm and foot pedometer is an option. She has a good understanding of her inhalers and has maintained acceptable blood pressure in [facility]. She is very competent with her oxygen and adjusting the flow rate for her different levels of excertion. In preparation for discharge from [place], Ms Sult purchased a foot and arm pedometer. She uses both the leg and arm position. She is also performing some strength exercises. She has a good understanding of her respiratory medications and is compliant. Of concern to her now is a problem with grant money for her 2 PAH medications: adcirca and tyvaso which are too expensive without financial aid. Her family is searching for help with this expense. Ms Leppla performs PLB at home and with exercise. She self adjusts her oxygen with activity between 3-4l/m. Ms Cropley has attended regularly and has enjoyed the education.      Expected Outcomes Continue exercise after [facility]. Continue exercising regularly at home and self-managing her COPD by the knowledge she has gained in [place].         Core Components/Risk Factors/Patient Goals at Discharge (Final Review):      Goals and Risk Factor Review - 07/25/16 1517      Core Components/Risk Factors/Patient Goals Review   Personal Goals Review Sedentary;Increase Strength and Stamina;Develop more efficient breathing techniques such as purse lipped breathing and diaphragmatic breathing and practicing self-pacing with activity.;Improve shortness of breath with ADL'Montgomery;Increase knowledge of respiratory medications and ability to use  respiratory devices properly.   Review In preparation for discharge from [place], Ms Kulik purchased a foot and arm pedometer. She uses both the leg and arm position. She is also performing some strength exercises. She has a good understanding of her respiratory medications and is compliant. Of concern to her now is a problem with grant money for her 2 PAH medications: adcirca and tyvaso which are too expensive without financial aid. Her family is searching for help with this expense. Ms Seppala performs PLB at home and with exercise. She self adjusts her oxygen with activity between 3-4l/m. Ms Kropp has attended regularly and has enjoyed the education.   Expected Outcomes Continue exercising regularly at home and self-managing her COPD by the knowledge she has gained in [place].      ITP Comments:     ITP Comments    Row Name 03/20/16 1251 04/12/16 1130 06/15/16 1148 07/18/16 1453     ITP Comments Hildegarde reports that she had tried to exercise in the [facility] but her father coded upstairs in the hospital and died. Simra said she had not been back to exercise since then. Baylei said that she knows she needs a hip surgery but she is not sure when the doctor can do it. We discussed that Pulm Rehab can get her strength up for her upcoming hip surgery.  Katheren is progressing well with exercise. Attended [employer] education class Called to check on Mrs. Kristen Montgomery.  Daughter has had pneumonia and not been able to drive her to class.  She hopes to return when it warms up.       Comments: 30 day note review

## 2016-08-05 DIAGNOSIS — I270 Primary pulmonary hypertension: Secondary | ICD-10-CM | POA: Diagnosis not present

## 2016-08-06 DIAGNOSIS — I272 Pulmonary hypertension, unspecified: Secondary | ICD-10-CM | POA: Diagnosis not present

## 2016-08-06 DIAGNOSIS — I2721 Secondary pulmonary arterial hypertension: Secondary | ICD-10-CM

## 2016-08-06 DIAGNOSIS — J449 Chronic obstructive pulmonary disease, unspecified: Secondary | ICD-10-CM | POA: Diagnosis not present

## 2016-08-06 NOTE — Progress Notes (Signed)
Daily Session Note  Patient Details  Name: SHERRYL VALIDO MRN: [id number] Date of Birth: October 01, 1950 Referring Provider:   Flowsheet Row Pulmonary Rehab from 03/20/2016 in [facility] Cardiac and Pulmonary Rehab  Referring Provider  [name]      Encounter Date: 08/06/2016  Check In:     Session Check In - 08/06/16 1339      Check-In   Location [facility]-Cardiac & Pulmonary Rehab   Staff Present Earlean Shawl, BS, ACSM CEP, Exercise Physiologist;Laureen Owens Shark, BS, RRT, Respiratory Dareen Piano, BA, ACSM CEP, Exercise Physiologist   Supervising physician immediately available to respond to emergencies [facility] immediately available ER MD   Physician(s) Mariea Clonts and Marcelene Butte   Medication changes reported     No   Fall or balance concerns reported    No   Warm-up and Cool-down Performed as group-led Location manager Performed Yes   VAD Patient? No     VAD patient   Has back up controller? No     Pain Assessment   Currently in Pain? No/denies         Goals Met:  Proper associated with RPD/PD & O2 Sat [employer] with exercise equipment Exercise tolerated well Strength training completed today  Goals Unmet:  Not Applicable  Comments: Pt able to follow exercise prescription today without complaint.  Will continue to monitor for progression.    Dr. Emily Filbert is Medical Director for [place] and [facility] Pulmonary Rehabilitation.

## 2016-08-08 ENCOUNTER — Encounter: Payer: Medicare Other | Admitting: *Deleted

## 2016-08-08 DIAGNOSIS — J449 Chronic obstructive pulmonary disease, unspecified: Secondary | ICD-10-CM

## 2016-08-08 DIAGNOSIS — I2721 Secondary pulmonary arterial hypertension: Secondary | ICD-10-CM

## 2016-08-08 DIAGNOSIS — I272 Pulmonary hypertension, unspecified: Secondary | ICD-10-CM | POA: Diagnosis not present

## 2016-08-08 NOTE — Patient Instructions (Signed)
Discharge Instructions  Patient Details  Name: Kristen Montgomery MRN: [id number] Date of Birth: [birth year] Referring Provider:  French Ana, MD   Number of Visits: 36  Reason for Discharge:  Patient reached a stable level of exercise. Patient independent in their exercise.  Smoking History:  History  Smoking Status   Not on file  Smokeless Tobacco   Not on file    Diagnosis:  PAH (pulmonary artery hypertension)  COPD, severe ([place])  Initial Exercise Prescription:     Initial Exercise Prescription - 03/20/16 1300      Date of Initial Exercise RX and Referring Provider   Date 03/20/16   Referring Provider [name]     Oxygen   Oxygen Continuous   Liters 4     Treadmill   MPH 0.5   Grade 0   Minutes 15  2 then rest and repeat     NuStep   Level 1   Minutes 15  2 then rest and repeat   METs 1     Arm Ergometer   Level 1   Minutes 15  2 min then rest and repeat     T5 Nustep   Level 1   Minutes 15  2 min then rest and repeat   METs 1     Prescription Details   Frequency (times per week) 3   Duration Progress to 30 minutes of continuous aerobic without signs/symptoms of physical distress     Intensity   THRR 40-80% of Max Heartrate 120-144   Ratings of Perceived Exertion 11-13   Perceived Dyspnea 0-4     Progression   Progression Continue to progress workloads to maintain intensity without signs/symptoms of physical distress.     Resistance Training   Training Prescription Yes   Weight 1   Reps 10-15      Discharge Exercise Prescription (Final Exercise Prescription Changes):     Exercise Prescription Changes - 07/31/16 1500      Exercise Review   Progression Yes     Response to Exercise   Blood Pressure (Admit) 116/64   Blood Pressure (Exercise) 126/70   Blood Pressure (Exit) 112/62   Heart Rate (Admit) 99 bpm   Heart Rate (Exercise) 118 bpm   Heart Rate (Exit) 102 bpm   Oxygen Saturation (Admit) 93 %   Oxygen Saturation  (Exercise) 90 %   Oxygen Saturation (Exit) 96 %   Rating of Perceived Exertion (Exercise) 13   Perceived Dyspnea (Exercise) 4   Symptoms SOB, fatigue on treadmill   Comments Home Exercise Guidelines given 05/11/16   Duration Progress to 45 minutes of aerobic exercise without signs/symptoms of physical distress   Intensity THRR unchanged     Progression   Progression Continue to progress workloads to maintain intensity without signs/symptoms of physical distress.   Average METs 1.6     Resistance Training   Training Prescription Yes   Weight 3 lbs   Reps 10-15     Interval Training   Interval Training No     Oxygen   Oxygen Continuous   Liters 4-6  6L on treadmill     Treadmill   MPH 0.8   Grade 0   Minutes 15     NuStep   Level 3   Minutes 15   METs 1.6     Biostep-RELP   Level 2   Minutes 15   METs 1     Home Exercise Plan   Plans to continue exercise  at Home  walking, chair exercises   Frequency Add 2 additional days to program exercise sessions.      Functional Capacity:     6 Minute Walk    Row Name 03/20/16 1332 05/11/16 1331 05/11/16 1336     6 Minute Walk   Phase  -- Mid Program Mid Program   Distance 340 feet  -- 270 feet   Distance % Change  --  -- -20.6 %   Walk Time 3 minutes  -- 2.02 minutes   # of Rest Breaks 3  -- 3  1:21, 20 sec, 2:18   MPH 1.28  -- 1.52   METS 1.17  -- 2.2   RPE 13  -- 17   Perceived Dyspnea  4  -- 4   VO2 Peak 4.11  -- 2.95   Symptoms No  -- Yes (comment)   Comments  --  -- SOB, fatigue   Resting HR 96 bpm  -- 96 bpm   Resting BP 128/60  -- 122/62   Max Ex. HR 116 bpm  -- 114 bpm   Max Ex. BP 142/62  -- 126/74   2 Minute Post BP 104/64  -- 122/70     Interval HR   Baseline HR 96  -- 96   1 Minute HR 113  -- 114   2 Minute HR 117  -- 112   3 Minute HR 114  -- 114   4 Minute HR  --  -- 106   5 Minute HR 116  -- 98   6 Minute HR  --  -- 101   2 Minute Post HR 100  -- 101   Interval Heart Rate? Yes  --  Yes     Interval Oxygen   Interval Oxygen? Yes  -- Yes   Baseline Oxygen Saturation % 93 %  -- 95 %   Baseline Liters of Oxygen 4 L  -- 3 L   1 Minute Oxygen Saturation % 93 %  -- 91 %  57 sec 83%, 1:13 91%   1 Minute Liters of Oxygen 4 L  -- 4 L   2 Minute Oxygen Saturation % 88 %  -- 87 %   2 Minute Liters of Oxygen 4 L  -- 4 L   3 Minute Oxygen Saturation % 93 %  -- 85 %   3 Minute Liters of Oxygen 4 L  -- 4 L   4 Minute Oxygen Saturation %  --  -- 87 %  3:26 81%   4 Minute Liters of Oxygen 4 L  -- 4 L   5 Minute Oxygen Saturation % 86 %  -- 93 %   5 Minute Liters of Oxygen 4 L  -- 4 L   6 Minute Oxygen Saturation %  --  -- 87 %  7:00 85%   6 Minute Liters of Oxygen 4 L  -- 4 L   2 Minute Post Oxygen Saturation % 100 %  -- 90 %   2 Minute Post Liters of Oxygen 4 L  -- 4 L   Row Name 08/08/16 1429         6 Minute Walk   Phase Discharge     Distance 512 feet     Distance % Change 50.6 %  172 ft from initial     Walk Time 4.35 minutes     # of Rest Breaks 5  4 sec, 3 sec, 7 sec, 1:15, 10 sec  MPH 1.34     METS 2     RPE 13     Perceived Dyspnea  4     VO2 Peak 4.55     Symptoms Yes (comment)     Comments SOB, fatigue     Resting HR 91 bpm     Resting BP 106/54     Max Ex. HR 114 bpm     Max Ex. BP 124/74     2 Minute Post BP 124/60       Interval HR   Baseline HR 91     1 Minute HR 112     2 Minute HR 114     3 Minute HR 106     4 Minute HR 107     5 Minute HR 114     6 Minute HR 120     2 Minute Post HR 99     Interval Heart Rate? Yes       Interval Oxygen   Interval Oxygen? Yes     Baseline Oxygen Saturation % 94 %     Baseline Liters of Oxygen 3 L     1 Minute Oxygen Saturation % 93 %  rest at 1:48 88%     1 Minute Liters of Oxygen 6 L     2 Minute Oxygen Saturation % 88 %  rest at 2:15 85,%, 83% at 2:46, recover to 90% at 3:27, resumed at 3:30     2 Minute Liters of Oxygen 6 L     3 Minute Oxygen Saturation % 86 %     3 Minute Liters of  Oxygen 6 L     4 Minute Oxygen Saturation % 96 %     4 Minute Liters of Oxygen 6 L     5 Minute Oxygen Saturation % 90 %     5 Minute Liters of Oxygen 6 L     6 Minute Oxygen Saturation % 85 %     6 Minute Liters of Oxygen 6 L     2 Minute Post Oxygen Saturation % 98 %     2 Minute Post Liters of Oxygen 6 L        Quality of Life:     Quality of Life - 06/29/16 1151      Quality of Life Scores   Health/Function Pre 20.93 %   Health/Function Post 20.4 %   Health/Function % Change -2.53 %   Socioeconomic Pre 21 %   Socioeconomic Post 27.43 %   Socioeconomic % Change  30.62 %   Psych/Spiritual Pre 21 %   Psych/Spiritual Post 27.43 %   Psych/Spiritual % Change 30.62 %   Family Pre 21 %   Family Post 30 %   Family % Change 42.86 %   GLOBAL Pre 20.97 %   GLOBAL Post 24.71 %   GLOBAL % Change 17.84 %      Personal Goals: Goals established at orientation with interventions provided to work toward goal.     Personal Goals and Risk Factors at Admission - 03/20/16 1438      Core Components/Risk Factors/Patient Goals on Admission   Develop more efficient breathing techniques such as purse lipped breathing and diaphragmatic breathing; and practicing self-pacing with activity Yes   Intervention Provide education, demonstration and support about specific breathing techniuqes utilized for more efficient breathing. Include techniques such as pursed lipped breathing, diaphragmatic breathing and self-pacing activity.   Expected Outcomes Short Term: Participant will be  able to demonstrate and use breathing techniques as needed throughout daily activities.   Increase knowledge of respiratory medications and ability to use respiratory devices properly  Yes   Intervention Provide education and demonstration as needed of appropriate use of medications, inhalers, and oxygen therapy.   Expected Outcomes Short Term: Achieves understanding of medications use. Understands that oxygen is a  medication prescribed by physician. Demonstrates appropriate use of inhaler and oxygen therapy.   Hypertension Yes   Intervention Provide education on lifestyle modifcations including regular physical activity/exercise, weight management, moderate sodium restriction and increased consumption of fresh fruit, vegetables, and low fat dairy, alcohol moderation, and smoking cessation.;Monitor prescription use compliance.   Expected Outcomes Short Term: Continued assessment and intervention until BP is < 140/79m HG in hypertensive participants. < 130/833mHG in hypertensive participants with diabetes, heart failure or chronic kidney disease.;Long Term: Maintenance of blood pressure at goal levels.       Personal Goals Discharge:     Goals and Risk Factor Review - 08/08/16 1442      Core Components/Risk Factors/Patient Goals Review   Personal Goals Review Increase knowledge of respiratory medications and ability to use respiratory devices properly.   Review Ms ShUtkeid get grant money for her AdJoanie Coddingtonnd Tyvaso. She improved her post [id number] Importance Difference for COPD is 98.4ft.1f ShoffBonusreally enjoyed the program and plans to continue her exercise at home and use her pedometer.      Nutrition & Weight - Outcomes:     Pre Biometrics - 03/20/16 1331      Pre Biometrics   Height _0  (1.499 m)   Weight 188 lb 6.4 oz (85.5 kg)   Waist Circumference 44.5 inches   Hip Circumference 51.13 inches   Waist to Hip Ratio 0.87 %   BMI (Calculated) 38.1       Nutrition:   Nutrition Discharge:   Education Questionnaire Score:     Knowledge Questionnaire Score - 06/29/16 1150      Knowledge Questionnaire Score   Pre Score 9/10   Post Score 7/10      Goals reviewed with patient; copy given to patient.

## 2016-08-08 NOTE — Progress Notes (Signed)
Discharge Summary  Patient Details  Name: Kristen Montgomery MRN: [id number] Date of Birth: 1951/03/30 Referring Provider:   Flowsheet Row Pulmonary Rehab from 03/20/2016 in [facility]  Referring Provider  [name]       Number of Visits: 36  Reason for Discharge:  Patient reached a stable level of exercise. Patient independent in their exercise.  Smoking History:  History  Smoking Status   Not on file  Smokeless Tobacco   Not on file    Diagnosis:  PAH (pulmonary artery hypertension)  COPD, severe ([place])  ADL [facility]:     Pulmonary Assessment Scores    Row Name 03/20/16 1435 05/11/16 1324 06/29/16 1150     ADL [facility]   ADL Phase Entry Mid Exit   SOB Score total 68 74 86   Rest 0 0 0   Walk _0 Stairs _1 Bath _2 Dress _3 Shop 3 0 3      Initial Exercise Prescription:     Initial Exercise Prescription - 03/20/16 1300      Date of Initial Exercise RX and Referring Provider   Date 03/20/16   Referring Provider [name]     Oxygen   Oxygen Continuous   Liters 4     Treadmill   MPH 0.5   Grade 0   Minutes 15  2 then rest and repeat     NuStep   Level 1   Minutes 15  2 then rest and repeat   METs 1     Arm Ergometer   Level 1   Minutes 15  2 min then rest and repeat     T5 Nustep   Level 1   Minutes 15  2 min then rest and repeat   METs 1     Prescription Details   Frequency (times per week) 3   Duration Progress to 30 minutes of continuous aerobic without signs/symptoms of physical distress     Intensity   THRR 40-80% of Max Heartrate 120-144   Ratings of Perceived Exertion 11-13   Perceived Dyspnea 0-4     Progression   Progression Continue to progress workloads to maintain intensity without signs/symptoms of physical distress.     Resistance Training   Training Prescription Yes   Weight 1   Reps 10-15      Discharge Exercise Prescription (Final Exercise Prescription Changes):     Exercise  Prescription Changes - 07/31/16 1500      Exercise Review   Progression Yes     Response to Exercise   Blood Pressure (Admit) 116/64   Blood Pressure (Exercise) 126/70   Blood Pressure (Exit) 112/62   Heart Rate (Admit) 99 bpm   Heart Rate (Exercise) 118 bpm   Heart Rate (Exit) 102 bpm   Oxygen Saturation (Admit) 93 %   Oxygen Saturation (Exercise) 90 %   Oxygen Saturation (Exit) 96 %   Rating of Perceived Exertion (Exercise) 13   Perceived Dyspnea (Exercise) 4   Symptoms SOB, fatigue on treadmill   Comments Home Exercise Guidelines given 05/11/16   Duration Progress to 45 minutes of aerobic exercise without signs/symptoms of physical distress   Intensity THRR unchanged     Progression   Progression Continue to progress workloads to maintain intensity without signs/symptoms of physical distress.   Average METs 1.6     Resistance Training   Training Prescription Yes  Weight 3 lbs   Reps 10-15     Interval Training   Interval Training No     Oxygen   Oxygen Continuous   Liters 4-6  6L on treadmill     Treadmill   MPH 0.8   Grade 0   Minutes 15     NuStep   Level 3   Minutes 15   METs 1.6     Biostep-RELP   Level 2   Minutes 15   METs 1     Home Exercise Plan   Plans to continue exercise at Home  walking, chair exercises   Frequency Add 2 additional days to program exercise sessions.      Functional Capacity:     6 Minute Walk    Row Name 03/20/16 1332 05/11/16 1331 05/11/16 1336     6 Minute Walk   Phase  -- Mid Program Mid Program   Distance 340 feet  -- 270 feet   Distance % Change  --  -- -20.6 %   Walk Time 3 minutes  -- 2.02 minutes   # of Rest Breaks 3  -- 3  1:21, 20 sec, 2:18   MPH 1.28  -- 1.52   METS 1.17  -- 2.2   RPE 13  -- 17   Perceived Dyspnea  4  -- 4   VO2 Peak 4.11  -- 2.95   Symptoms No  -- Yes (comment)   Comments  --  -- SOB, fatigue   Resting HR 96 bpm  -- 96 bpm   Resting BP 128/60  -- 122/62   Max Ex. HR 116 bpm   -- 114 bpm   Max Ex. BP 142/62  -- 126/74   2 Minute Post BP 104/64  -- 122/70     Interval HR   Baseline HR 96  -- 96   1 Minute HR 113  -- 114   2 Minute HR 117  -- 112   3 Minute HR 114  -- 114   4 Minute HR  --  -- 106   5 Minute HR 116  -- 98   6 Minute HR  --  -- 101   2 Minute Post HR 100  -- 101   Interval Heart Rate? Yes  -- Yes     Interval Oxygen   Interval Oxygen? Yes  -- Yes   Baseline Oxygen Saturation % 93 %  -- 95 %   Baseline Liters of Oxygen 4 L  -- 3 L   1 Minute Oxygen Saturation % 93 %  -- 91 %  57 sec 83%, 1:13 91%   1 Minute Liters of Oxygen 4 L  -- 4 L   2 Minute Oxygen Saturation % 88 %  -- 87 %   2 Minute Liters of Oxygen 4 L  -- 4 L   3 Minute Oxygen Saturation % 93 %  -- 85 %   3 Minute Liters of Oxygen 4 L  -- 4 L   4 Minute Oxygen Saturation %  --  -- 87 %  3:26 81%   4 Minute Liters of Oxygen 4 L  -- 4 L   5 Minute Oxygen Saturation % 86 %  -- 93 %   5 Minute Liters of Oxygen 4 L  -- 4 L   6 Minute Oxygen Saturation %  --  -- 87 %  7:00 85%   6 Minute Liters of Oxygen 4 L  -- 4  L   2 Minute Post Oxygen Saturation % 100 %  -- 90 %   2 Minute Post Liters of Oxygen 4 L  -- 4 L   Row Name 08/08/16 1429         6 Minute Walk   Phase Discharge     Distance 512 feet     Distance % Change 50.6 %  172 ft from initial     Walk Time 4.35 minutes     # of Rest Breaks 5  4 sec, 3 sec, 7 sec, 1:15, 10 sec     MPH 1.34     METS 2     RPE 13     Perceived Dyspnea  4     VO2 Peak 4.55     Symptoms Yes (comment)     Comments SOB, fatigue     Resting HR 91 bpm     Resting BP 106/54     Max Ex. HR 114 bpm     Max Ex. BP 124/74     2 Minute Post BP 124/60       Interval HR   Baseline HR 91     1 Minute HR 112     2 Minute HR 114     3 Minute HR 106     4 Minute HR 107     5 Minute HR 114     6 Minute HR 120     2 Minute Post HR 99     Interval Heart Rate? Yes       Interval Oxygen   Interval Oxygen? Yes     Baseline Oxygen  Saturation % 94 %     Baseline Liters of Oxygen 3 L     1 Minute Oxygen Saturation % 93 %  rest at 1:48 88%     1 Minute Liters of Oxygen 6 L     2 Minute Oxygen Saturation % 88 %  rest at 2:15 85,%, 83% at 2:46, recover to 90% at 3:27, resumed at 3:30     2 Minute Liters of Oxygen 6 L     3 Minute Oxygen Saturation % 86 %     3 Minute Liters of Oxygen 6 L     4 Minute Oxygen Saturation % 96 %     4 Minute Liters of Oxygen 6 L     5 Minute Oxygen Saturation % 90 %     5 Minute Liters of Oxygen 6 L     6 Minute Oxygen Saturation % 85 %     6 Minute Liters of Oxygen 6 L     2 Minute Post Oxygen Saturation % 98 %     2 Minute Post Liters of Oxygen 6 L        Psychological, QOL, Others - Outcomes: PHQ 2/9: Depression screen [facility] 2/9 06/29/2016 03/20/2016  Decreased Interest 0 0  Down, Depressed, Hopeless 0 0  PHQ - 2 Score 0 0  Altered sleeping 0 0  Tired, decreased energy 0 0  Change in appetite 0 0  Feeling bad or failure about yourself  0 0  Trouble concentrating 0 0  Moving slowly or fidgety/restless 0 0  Suicidal thoughts 0 0  PHQ-9 Score 0 0    Quality of Life:     Quality of Life - 06/29/16 1151      Quality of Life Scores   Health/Function Pre 20.93 %   Health/Function Post 20.4 %  Health/Function % Change -2.53 %   Socioeconomic Pre 21 %   Socioeconomic Post 27.43 %   Socioeconomic % Change  30.62 %   Psych/Spiritual Pre 21 %   Psych/Spiritual Post 27.43 %   Psych/Spiritual % Change 30.62 %   Family Pre 21 %   Family Post 30 %   Family % Change 42.86 %   GLOBAL Pre 20.97 %   GLOBAL Post 24.71 %   GLOBAL % Change 17.84 %      Personal Goals: Goals established at orientation with interventions provided to work toward goal.     Personal Goals and Risk Factors at Admission - 03/20/16 1438      Core Components/Risk Factors/Patient Goals on Admission   Develop more efficient breathing techniques such as purse lipped breathing and diaphragmatic  breathing; and practicing self-pacing with activity Yes   Intervention Provide education, demonstration and support about specific breathing techniuqes utilized for more efficient breathing. Include techniques such as pursed lipped breathing, diaphragmatic breathing and self-pacing activity.   Expected Outcomes Short Term: Participant will be able to demonstrate and use breathing techniques as needed throughout daily activities.   Increase knowledge of respiratory medications and ability to use respiratory devices properly  Yes   Intervention Provide education and demonstration as needed of appropriate use of medications, inhalers, and oxygen therapy.   Expected Outcomes Short Term: Achieves understanding of medications use. Understands that oxygen is a medication prescribed by physician. Demonstrates appropriate use of inhaler and oxygen therapy.   Hypertension Yes   Intervention Provide education on lifestyle modifcations including regular physical activity/exercise, weight management, moderate sodium restriction and increased consumption of fresh fruit, vegetables, and low fat dairy, alcohol moderation, and smoking cessation.;Monitor prescription use compliance.   Expected Outcomes Short Term: Continued assessment and intervention until BP is < 140/59m HG in hypertensive participants. < 130/873mHG in hypertensive participants with diabetes, heart failure or chronic kidney disease.;Long Term: Maintenance of blood pressure at goal levels.       Personal Goals Discharge:     Goals and Risk Factor Review    Row Name 03/30/16 1247 04/04/16 1307 04/11/16 1254 04/24/16 0726 06/06/16 1333     Core Components/Risk Factors/Patient Goals Review   Personal Goals Review Sedentary;Increase Strength and Stamina Weight Management/Obesity;Hypertension  -- Sedentary;Increase Strength and Stamina;Develop more efficient breathing techniques such as purse lipped breathing and diaphragmatic breathing and  practicing self-pacing with activity.;Improve shortness of breath with ADL's;Increase knowledge of respiratory medications and ability to use respiratory devices properly.;Hypertension  --   Review Ms. JeSadis not doing much at home.  She is getting here three days a week for exercise.  She felt better after her last round of rehab, and hopes to do the same this time around. JeOluwateniolaaid she used to weight 129-130 before she quit smoking. JeAvacynaid then she was 150lbs when she started getting sick but now weight is up to 189lbs. Janyia's blood pressure is very good.  JeTemaraaid she doesn't have any problems with her inhalers. JePheonixaid she knows that she can not get better but she is trying not to get  worse. Continues to use pursed lip breathing and she is on supplemental oxgyen.  Ms ShPenningeras given a spacer today for her Symbicort and Ventolin. She has a routine for the order she takes her inhalers and this has helped her take her tyvaso. She independently adjusts her oxygen with a higher flow on the treadmill. Ms ShPhoenixaintains  acceptable BP's and knows the importance of lower BP for her PAH. Her exercise goals have increased , and she is very motivated to be more active , especially for her COPD management. Ms Storbeck is progressing with her exercise goals. She is maintaining acceptable BP and adjusts her oxygen accordingly with her different exercise goals. Ms Kretzschmar has a good understanding of her inhalers and her tyvaso SVN for her PAH.   Expected Outcomes We will go over home exercise with Romie Minus and continue to monitor for improvements in strength and stamina. To lose 2 lbs. Cont. stable blood pressure.  Continued use of her inhalers and pursed lip breathing techniques  -- Continue exercising regularly in [place] and continue her management of COPD.   [place] Name 06/25/16 1554 07/25/16 1517 08/08/16 1442         Core Components/Risk Factors/Patient Goals Review   Personal Goals Review  Sedentary;Increase Strength and Stamina;Improve shortness of breath with ADL's;Increase knowledge of respiratory medications and ability to use respiratory devices properly.;Develop more efficient breathing techniques such as purse lipped breathing and diaphragmatic breathing and practicing self-pacing with activity.;Hypertension Sedentary;Increase Strength and Stamina;Develop more efficient breathing techniques such as purse lipped breathing and diaphragmatic breathing and practicing self-pacing with activity.;Improve shortness of breath with ADL's;Increase knowledge of respiratory medications and ability to use respiratory devices properly. Increase knowledge of respiratory medications and ability to use respiratory devices properly.     Review Ms Feagan is close to graduating. We talked today about her plans for exercise after [facility]. She had been in [employer], but will not consider [place] in the winter, since she has a long drive and is worried about exercising with a large group. She states she can walk in her home. The purchase of an arm and foot pedometer is an option. She has a good understanding of her inhalers and has maintained acceptable blood pressure in [facility]. She is very competent with her oxygen and adjusting the flow rate for her different levels of excertion. In preparation for discharge from [place], Ms Lopez purchased a foot and arm pedometer. She uses both the leg and arm position. She is also performing some strength exercises. She has a good understanding of her respiratory medications and is compliant. Of concern to her now is a problem with grant money for her 2 PAH medications: adcirca and tyvaso which are too expensive without financial aid. Her family is searching for help with this expense. Ms Leeb performs PLB at home and with exercise. She self adjusts her oxygen with activity between 3-4l/m. Ms Ege has attended regularly and has enjoyed the education. Ms Bahena  did get grant money for her Joanie [place] and Tyvaso. She improved her post 81md by 1716f- Minimal Importance Difference for COPD is [id number] ShoLunds really enjoyed the program and plans to continue her exercise at home and use her pedometer.     Expected Outcomes Continue exercise after [facility]. Continue exercising regularly at home and self-managing her COPD by [place]--        Nutrition & Weight - Outcomes:     Pre Biometrics - 03/20/16 1331      Pre Biometrics   Height _0  (1.499 m)   Weight 188 lb 6.4 oz (85.5 kg)   Waist Circumference 44.5 inches   Hip Circumference 51.13 inches   Waist to Hip Ratio 0.87 %   BMI (Calculated) 38.1       Nutrition:  Nutrition Discharge:   Education Questionnaire Score:     Knowledge Questionnaire Score - 06/29/16 1150      Knowledge Questionnaire Score   Pre Score 9/10   Post Score 7/10      Goals reviewed with patient; copy given to patient.

## 2016-08-08 NOTE — Progress Notes (Signed)
Daily Session Note  Patient Details  Name: Kristen Montgomery MRN: [id number] Date of Birth: [birth year] Referring Provider:   April Manson [facility] from 03/20/2016 in [facility]  Referring Provider  [name]      Encounter Date: 08/08/2016  Check In:     Session Check In - 08/08/16 1144      Check-In   Location [facility]-Cardiac & Pulmonary Rehab   Staff Present Carson Myrtle, BS, RRT, Respiratory Lennie Hummer, MA, ACSM RCEP, Exercise Physiologist;Jachelle Fluty RN BSN   Supervising physician immediately available to respond to emergencies [facility] immediately available ER MD   Physician(s) Drs. Malinda and Lord   Medication changes reported     No   Fall or balance concerns reported    No   Warm-up and Cool-down Performed as group-led Location manager Performed Yes   VAD Patient? No     VAD patient   Has back up controller? No     Pain Assessment   Currently in Pain? No/denies   Multiple Pain Sites No         Goals Met:  Proper associated with RPD/PD & O2 Sat Independence with exercise equipment Using PLB without cueing & demonstrates good technique Exercise tolerated well No report of cardiac concerns or symptoms Strength training completed today  Goals Unmet:  Not Applicable  Comments: Pt able to follow exercise prescription today without complaint.  Will continue to monitor for progression.    Dr. Emily Filbert is Medical Director for [place] and [facility].

## 2016-08-08 NOTE — Progress Notes (Signed)
Daily Session Note  Patient Details  Name: Kristen Montgomery MRN: [id number] Date of Birth: [birth year] Referring Provider:   April Manson [facility] Rehab from 03/20/2016 in [facility]  Referring Provider  [name]      Encounter Date: 08/08/2016  Check In:     Session Check In - 08/08/16 1144      Check-In   Location [facility] Pul[facility] Re[facility]   Staff Present Carson Myrtle, BS, RRT, Respiratory Lennie Hummer, MA, ACSM RCEP, Exercise Physiologist;Patricia Surles RN BSN   Supervising physician immediately available to respond to emergencies [facility] immediately available ER MD   Physician(s) Drs. Malinda and Lord   Medication changes reported     No   Fall or balance concerns reported    No   Warm-up and Cool-down Performed as group-led Location manager Performed Yes   VAD Patient? No     VAD patient   Has back up controller? No     Pain Assessment   Currently in Pain? No/denies   Multiple Pain Sites No         Goals Met:  Proper associated with RPD/PD & O2 Sat Independence with exercise equipment Improved SOB with ADL's Using PLB without cueing & demonstrates good technique Exercise tolerated well Strength training completed today  Goals Unmet:  Not Applicable  Comments: Pt able to follow exercise prescription today without complaint.  Will continue to monitor for progression.     [place] Name 03/20/16 1332 05/11/16 1331 05/11/16 1336     6 Minute Walk   Phase  - Mid Program Mid Program   Distance 340 feet  - 270 feet   Distance % Change  -  - -20.6 %   Walk Time 3 minutes  - 2.02 minutes   # of Rest Breaks 3  - 3  1:21, 20 sec, 2:18   MPH 1.28  - 1.52   METS 1.17  - 2.2   RPE 13  - 17   Perceived Dyspnea  4  - 4   VO2 Peak 4.11  - 2.95   Symptoms No  - Yes (comment)   Comments  -  - SOB, fatigue   Resting HR 96 bpm  - 96 bpm   Resting BP 128/60  - 122/62   Max Ex. HR 116 bpm  - 114  bpm   Max Ex. BP 142/62  - 126/74   2 Minute Post BP 104/64  - 122/70     Interval HR   Baseline HR 96  - 96   1 Minute HR 113  - 114   2 Minute HR 117  - 112   3 Minute HR 114  - 114   4 Minute HR  -  - 106   5 Minute HR 116  - 98   6 Minute HR  -  - 101   2 Minute Post HR 100  - 101   Interval Heart Rate? Yes  - Yes     Interval Oxygen   Interval Oxygen? Yes  - Yes   Baseline Oxygen Saturation % 93 %  - 95 %   Baseline Liters of Oxygen 4 L  - 3 L   1 Minute Oxygen Saturation % 93 %  - 91 %  57 sec 83%, 1:13 91%   1 Minute Liters of Oxygen 4 L  - 4 L   2 Minute Oxygen Saturation % 88 %  -  87 %   2 Minute Liters of Oxygen 4 L  - 4 L   3 Minute Oxygen Saturation % 93 %  - 85 %   3 Minute Liters of Oxygen 4 L  - 4 L   4 Minute Oxygen Saturation %  -  - 87 %  3:26 81%   4 Minute Liters of Oxygen 4 L  - 4 L   5 Minute Oxygen Saturation % 86 %  - 93 %   5 Minute Liters of Oxygen 4 L  - 4 L   6 Minute Oxygen Saturation %  -  - 87 %  7:00 85%   6 Minute Liters of Oxygen 4 L  - 4 L   2 Minute Post Oxygen Saturation % 100 %  - 90 %   2 Minute Post Liters of Oxygen 4 L  - 4 L   Row Name 08/08/16 1429         6 Minute Walk   Phase Discharge     Distance 512 feet     Distance % Change 50.6 %  172 ft from initial     Walk Time 4.35 minutes     # of Rest Breaks 5  4 sec, 3 sec, 7 sec, 1:15, 10 sec     MPH 1.34     METS 2     RPE 13     Perceived Dyspnea  4     VO2 Peak 4.55     Symptoms Yes (comment)     Comments SOB, fatigue     Resting HR 91 bpm     Resting BP 106/54     Max Ex. HR 114 bpm     Max Ex. BP 124/74     2 Minute Post BP 124/60       Interval HR   Baseline HR 91     1 Minute HR 112     2 Minute HR 114     3 Minute HR 106     4 Minute HR 107     5 Minute HR 114     6 Minute HR 120     2 Minute Post HR 99     Interval Heart Rate? Yes       Interval Oxygen   Interval Oxygen? Yes     Baseline Oxygen Saturation % 94 %     Baseline Liters of Oxygen  3 L     1 Minute Oxygen Saturation % 93 %  rest at 1:48 88%     1 Minute Liters of Oxygen 6 L     2 Minute Oxygen Saturation % 88 %  rest at 2:15 85,%, 83% at 2:46, recover to 90% at 3:27, resumed at 3:30     2 Minute Liters of Oxygen 6 L     3 Minute Oxygen Saturation % 86 %     3 Minute Liters of Oxygen 6 L     4 Minute Oxygen Saturation % 96 %     4 Minute Liters of Oxygen 6 L     5 Minute Oxygen Saturation % 90 %     5 Minute Liters of Oxygen 6 L     6 Minute Oxygen Saturation % 85 %     6 Minute Liters of Oxygen 6 L     2 Minute Post Oxygen Saturation % 98 %     2 Minute Post Liters of Oxygen 6 L  Dr. Emily Filbert is Medical Director for [place] and [facility].

## 2016-08-10 ENCOUNTER — Encounter: Payer: Medicare Other | Admitting: *Deleted

## 2016-08-10 DIAGNOSIS — J449 Chronic obstructive pulmonary disease, unspecified: Secondary | ICD-10-CM | POA: Diagnosis not present

## 2016-08-10 DIAGNOSIS — I2721 Secondary pulmonary arterial hypertension: Secondary | ICD-10-CM

## 2016-08-10 DIAGNOSIS — I272 Pulmonary hypertension, unspecified: Secondary | ICD-10-CM | POA: Diagnosis not present

## 2016-08-10 NOTE — Patient Instructions (Signed)
Discharge Instructions  Patient Details  Name: Kristen Montgomery MRN: [id number] Date of Birth: [birth year] Referring Provider:  French Ana, MD   Number of Visits: 36  Reason for Discharge:  Patient reached a stable level of exercise. Patient independent in their exercise.  Smoking History:  History  Smoking Status  . Not on file  Smokeless Tobacco  . Not on file    Diagnosis:  PAH (pulmonary artery hypertension)  Initial Exercise Prescription:     Initial Exercise Prescription - 03/20/16 1300      Date of Initial Exercise RX and Referring Provider   Date 03/20/16   Referring Provider [name]     Oxygen   Oxygen Continuous   Liters 4     Treadmill   MPH 0.5   Grade 0   Minutes 15  2 then rest and repeat     NuStep   Level 1   Minutes 15  2 then rest and repeat   METs 1     Arm Ergometer   Level 1   Minutes 15  2 min then rest and repeat     T5 Nustep   Level 1   Minutes 15  2 min then rest and repeat   METs 1     Prescription Details   Frequency (times per week) 3   Duration Progress to 30 minutes of continuous aerobic without signs/symptoms of physical distress     Intensity   THRR 40-80% of Max Heartrate 120-144   Ratings of Perceived Exertion 11-13   Perceived Dyspnea 0-4     Progression   Progression Continue to progress workloads to maintain intensity without signs/symptoms of physical distress.     Resistance Training   Training Prescription Yes   Weight 1   Reps 10-15      Discharge Exercise Prescription (Final Exercise Prescription Changes):     Exercise Prescription Changes - 07/31/16 1500      Exercise Review   Progression Yes     Response to Exercise   Blood Pressure (Admit) 116/64   Blood Pressure (Exercise) 126/70   Blood Pressure (Exit) 112/62   Heart Rate (Admit) 99 bpm   Heart Rate (Exercise) 118 bpm   Heart Rate (Exit) 102 bpm   Oxygen Saturation (Admit) 93 %   Oxygen Saturation (Exercise) 90 %   Oxygen  Saturation (Exit) 96 %   Rating of Perceived Exertion (Exercise) 13   Perceived Dyspnea (Exercise) 4   Symptoms SOB, fatigue on treadmill   Comments Home Exercise Guidelines given 05/11/16   Duration Progress to 45 minutes of aerobic exercise without signs/symptoms of physical distress   Intensity THRR unchanged     Progression   Progression Continue to progress workloads to maintain intensity without signs/symptoms of physical distress.   Average METs 1.6     Resistance Training   Training Prescription Yes   Weight 3 lbs   Reps 10-15     Interval Training   Interval Training No     Oxygen   Oxygen Continuous   Liters 4-6  6L on treadmill     Treadmill   MPH 0.8   Grade 0   Minutes 15     NuStep   Level 3   Minutes 15   METs 1.6     Biostep-RELP   Level 2   Minutes 15   METs 1     Home Exercise Plan   Plans to continue exercise at Home  walking,  chair exercises   Frequency Add 2 additional days to program exercise sessions.      Functional Capacity:     6 Minute Walk    [place] Name 03/20/16 1332 05/11/16 1331 05/11/16 1336     6 Minute Walk   Phase  - Mid Program Mid Program   Distance 340 feet  - 270 feet   Distance % Change  -  - -20.6 %   Walk Time 3 minutes  - 2.02 minutes   # of Rest Breaks 3  - 3  1:21, 20 sec, 2:18   MPH 1.28  - 1.52   METS 1.17  - 2.2   RPE 13  - 17   Perceived Dyspnea  4  - 4   VO2 Peak 4.11  - 2.95   Symptoms No  - Yes (comment)   Comments  -  - SOB, fatigue   Resting HR 96 bpm  - 96 bpm   Resting BP 128/60  - 122/62   Max Ex. HR 116 bpm  - 114 bpm   Max Ex. BP 142/62  - 126/74   2 Minute Post BP 104/64  - 122/70     Interval HR   Baseline HR 96  - 96   1 Minute HR 113  - 114   2 Minute HR 117  - 112   3 Minute HR 114  - 114   4 Minute HR  -  - 106   5 Minute HR 116  - 98   6 Minute HR  -  - 101   2 Minute Post HR 100  - 101   Interval Heart Rate? Yes  - Yes     Interval Oxygen   Interval Oxygen? Yes  - Yes    Baseline Oxygen Saturation % 93 %  - 95 %   Baseline Liters of Oxygen 4 L  - 3 L   1 Minute Oxygen Saturation % 93 %  - 91 %  57 sec 83%, 1:13 91%   1 Minute Liters of Oxygen 4 L  - 4 L   2 Minute Oxygen Saturation % 88 %  - 87 %   2 Minute Liters of Oxygen 4 L  - 4 L   3 Minute Oxygen Saturation % 93 %  - 85 %   3 Minute Liters of Oxygen 4 L  - 4 L   4 Minute Oxygen Saturation %  -  - 87 %  3:26 81%   4 Minute Liters of Oxygen 4 L  - 4 L   5 Minute Oxygen Saturation % 86 %  - 93 %   5 Minute Liters of Oxygen 4 L  - 4 L   6 Minute Oxygen Saturation %  -  - 87 %  7:00 85%   6 Minute Liters of Oxygen 4 L  - 4 L   2 Minute Post Oxygen Saturation % 100 %  - 90 %   2 Minute Post Liters of Oxygen 4 L  - 4 L   Row Name 08/08/16 1429         6 Minute Walk   Phase Discharge     Distance 512 feet     Distance % Change 50.6 %  172 ft from initial     Walk Time 4.35 minutes     # of Rest Breaks 5  4 sec, 3 sec, 7 sec, 1:15, 10 sec     MPH  1.34     METS 2     RPE 13     Perceived Dyspnea  4     VO2 Peak 4.55     Symptoms Yes (comment)     Comments SOB, fatigue     Resting HR 91 bpm     Resting BP 106/54     Max Ex. HR 114 bpm     Max Ex. BP 124/74     2 Minute Post BP 124/60       Interval HR   Baseline HR 91     1 Minute HR 112     2 Minute HR 114     3 Minute HR 106     4 Minute HR 107     5 Minute HR 114     6 Minute HR 120     2 Minute Post HR 99     Interval Heart Rate? Yes       Interval Oxygen   Interval Oxygen? Yes     Baseline Oxygen Saturation % 94 %     Baseline Liters of Oxygen 3 L     1 Minute Oxygen Saturation % 93 %  rest at 1:48 88%     1 Minute Liters of Oxygen 6 L     2 Minute Oxygen Saturation % 88 %  rest at 2:15 85,%, 83% at 2:46, recover to 90% at 3:27, resumed at 3:30     2 Minute Liters of Oxygen 6 L     3 Minute Oxygen Saturation % 86 %     3 Minute Liters of Oxygen 6 L     4 Minute Oxygen Saturation % 96 %     4 Minute Liters of  Oxygen 6 L     5 Minute Oxygen Saturation % 90 %     5 Minute Liters of Oxygen 6 L     6 Minute Oxygen Saturation % 85 %     6 Minute Liters of Oxygen 6 L     2 Minute Post Oxygen Saturation % 98 %     2 Minute Post Liters of Oxygen 6 L        Quality of Life:     Quality of Life - 06/29/16 1151      Quality of Life Scores   Health/Function Pre 20.93 %   Health/Function Post 20.4 %   Health/Function % Change -2.53 %   Socioeconomic Pre 21 %   Socioeconomic Post 27.43 %   Socioeconomic % Change  30.62 %   Psych/Spiritual Pre 21 %   Psych/Spiritual Post 27.43 %   Psych/Spiritual % Change 30.62 %   Family Pre 21 %   Family Post 30 %   Family % Change 42.86 %   GLOBAL Pre 20.97 %   GLOBAL Post 24.71 %   GLOBAL % Change 17.84 %      Personal Goals: Goals established at orientation with interventions provided to work toward goal.     Personal Goals and Risk Factors at Admission - 03/20/16 1438      Core Components/Risk Factors/Patient Goals on Admission   Develop more efficient breathing techniques such as purse lipped breathing and diaphragmatic breathing; and practicing self-pacing with activity Yes   Intervention Provide education, demonstration and support about specific breathing techniuqes utilized for more efficient breathing. Include techniques such as pursed lipped breathing, diaphragmatic breathing and self-pacing activity.   Expected Outcomes Short Term: Participant will be able  to demonstrate and use breathing techniques as needed throughout daily activities.   Increase knowledge of respiratory medications and ability to use respiratory devices properly  Yes   Intervention Provide education and demonstration as needed of appropriate use of medications, inhalers, and oxygen therapy.   Expected Outcomes Short Term: Achieves understanding of medications use. Understands that oxygen is a medication prescribed by physician. Demonstrates appropriate use of inhaler and  oxygen therapy.   Hypertension Yes   Intervention Provide education on lifestyle modifcations including regular physical activity/exercise, weight management, moderate sodium restriction and increased consumption of fresh fruit, vegetables, and low fat dairy, alcohol moderation, and smoking cessation.;Monitor prescription use compliance.   Expected Outcomes Short Term: Continued assessment and intervention until BP is < 140/78m HG in hypertensive participants. < 130/823mHG in hypertensive participants with diabetes, heart failure or chronic kidney disease.;Long Term: Maintenance of blood pressure at goal levels.       Personal Goals Discharge:     Goals and Risk Factor Review - 08/08/16 1442      Core Components/Risk Factors/Patient Goals Review   Personal Goals Review Increase knowledge of respiratory medications and ability to use respiratory devices properly.   Review Ms ShPennywellid get grant money for her AdJoanie Coddingtonnd Tyvaso. She improved her post [id number] Importance Difference for COPD is 98.4ft.28f ShoffVoganreally enjoyed the program and plans to continue her exercise at home and use her pedometer.      Nutrition & Weight - Outcomes:     Pre Biometrics - 03/20/16 1331      Pre Biometrics   Height _0  (1.499 m)   Weight 188 lb 6.4 oz (85.5 kg)   Waist Circumference 44.5 inches   Hip Circumference 51.13 inches   Waist to Hip Ratio 0.87 %   BMI (Calculated) 38.1       Nutrition:   Nutrition Discharge:   Education Questionnaire Score:     Knowledge Questionnaire Score - 06/29/16 1150      Knowledge Questionnaire Score   Pre Score 9/10   Post Score 7/10      Goals reviewed with patient; copy given to patient.

## 2016-08-10 NOTE — Progress Notes (Signed)
Pulmonary Individual Treatment Plan  Patient Details  Name: Kristen Montgomery MRN: [id number] Date of Birth: 19-Apr-1951 Referring Provider:   Flowsheet Row Pulmonary Rehab from 03/20/2016 in [facility]  Referring Provider  [name]      Initial Encounter Date:  Flowsheet Row Pulmonary Rehab from 03/20/2016 in [facility] Cardiac and Pulmonary Rehab  Date  03/20/16  Referring Provider  [name]      Visit Diagnosis: PAH (pulmonary artery hypertension)  Patient's Home Medications on Admission:  Current Outpatient Prescriptions:  .  albuterol (ACCUNEB) 0.63 MG/3ML nebulizer solution, Inhale 1 ampule by nebulization every six (6) hours as needed for wheezing., Disp: , Rfl:  .  albuterol (VENTOLIN HFA) 108 (90 Base) MCG/ACT inhaler, INHALE 2 PUFFS BY MOUTH EVERY 4 TO 6 HOURS AS NEEDED FOR SHORTNESS OF BREATH, COUGH OR WHEEZE, Disp: , Rfl:  .  aspirin EC 81 MG tablet, Take by mouth., Disp: , Rfl:  .  carvedilol (COREG) 3.125 MG tablet, Take 3.125 mg by mouth., Disp: , Rfl:  .  docusate sodium (COLACE) 100 MG capsule, Take 100 mg by mouth., Disp: , Rfl:  .  furosemide (LASIX) 40 MG tablet, Take 40 mg by mouth., Disp: , Rfl:  .  losartan (COZAAR) 50 MG tablet, Take 50 mg by mouth 1 day or 1 dose., Disp: , Rfl:  .  omeprazole (PRILOSEC) 20 MG capsule, Take 20 mg by mouth., Disp: , Rfl:  .  potassium chloride SA (K-DUR,KLOR-CON) 20 MEQ tablet, Take 10 mEq by mouth., Disp: , Rfl:  .  tiotropium (SPIRIVA HANDIHALER) 18 MCG inhalation capsule, INHALE 1 CAPSULE VIA HANDIHALER ONCE DAILY AT THE SAME TIME EVERY DAY, Disp: , Rfl:  .  traZODone (DESYREL) 50 MG tablet, TAKE 1 TABLET BY MOUTH AT BEDTIME AS NEEDED, Disp: , Rfl:  .  Treprostinil (TYVASO) 0.6 MG/ML SOLN, Inhale 3-9 breaths four times daily., Disp: , Rfl:   Past Medical History: No past medical history on file.  Tobacco Use: History  Smoking Status  . Not on file  Smokeless Tobacco  . Not on file    Labs: Recent Review  Flowsheet Data    There is no flowsheet data to display.       ADL UCSD:     Pulmonary Assessment Scores    Row Name 03/20/16 1435 05/11/16 1324 06/29/16 1150     ADL UCSD   ADL Phase Entry Mid Exit   SOB Score total 68 74 86   Rest 0 0 0   Walk _0 Stairs _1 Bath _2 Dress _3 Shop 3 0 3      Pulmonary Function Assessment:     Pulmonary Function Assessment - 03/20/16 1433      Initial Spirometry Results   FVC% 31 %   FEV1% 27 %   FEV1/FVC Ratio 68.6   Comments Test date 03/20/2016      Post Bronchodilator Spirometry Results   FVC% 46 %   FEV1% 29 %   FEV1/FVC Ratio 62.47     Breath   Bilateral Breath Sounds Decreased   Shortness of Breath Yes;Fear of Shortness of Breath;Limiting activity      Exercise Target Goals:    Exercise Program Goal: Individual exercise prescription set with THRR, safety & activity barriers. Participant demonstrates ability to understand and report RPE using BORG scale, to self-measure pulse accurately, and to acknowledge the importance of  the exercise prescription.  Exercise Prescription Goal: Starting with aerobic activity 30 plus minutes a day, 3 days per week for initial exercise prescription. Provide home exercise prescription and guidelines that participant acknowledges understanding prior to discharge.  Activity Barriers & Risk Stratification:     Activity Barriers & Cardiac Risk Stratification - 03/20/16 1432      Activity Barriers & Cardiac Risk Stratification   Activity Barriers Shortness of Breath;Deconditioning   Cardiac Risk Stratification Moderate      6 Minute Walk:     6 Minute Walk    Row Name 03/20/16 1332 05/11/16 1331 05/11/16 1336     6 Minute Walk   Phase  - Mid Program Mid Program   Distance 340 feet  - 270 feet   Distance % Change  -  - -20.6 %   Walk Time 3 minutes  - 2.02 minutes   # of Rest Breaks 3  - 3  1:21, 20 sec, 2:18   MPH 1.28  - 1.52   METS 1.17  - 2.2   RPE 13   - 17   Perceived Dyspnea  4  - 4   VO2 Peak 4.11  - 2.95   Symptoms No  - Yes (comment)   Comments  -  - SOB, fatigue   Resting HR 96 bpm  - 96 bpm   Resting BP 128/60  - 122/62   Max Ex. HR 116 bpm  - 114 bpm   Max Ex. BP 142/62  - 126/74   2 Minute Post BP 104/64  - 122/70     Interval HR   Baseline HR 96  - 96   1 Minute HR 113  - 114   2 Minute HR 117  - 112   3 Minute HR 114  - 114   4 Minute HR  -  - 106   5 Minute HR 116  - 98   6 Minute HR  -  - 101   2 Minute Post HR 100  - 101   Interval Heart Rate? Yes  - Yes     Interval Oxygen   Interval Oxygen? Yes  - Yes   Baseline Oxygen Saturation % 93 %  - 95 %   Baseline Liters of Oxygen 4 L  - 3 L   1 Minute Oxygen Saturation % 93 %  - 91 %  57 sec 83%, 1:13 91%   1 Minute Liters of Oxygen 4 L  - 4 L   2 Minute Oxygen Saturation % 88 %  - 87 %   2 Minute Liters of Oxygen 4 L  - 4 L   3 Minute Oxygen Saturation % 93 %  - 85 %   3 Minute Liters of Oxygen 4 L  - 4 L   4 Minute Oxygen Saturation %  -  - 87 %  3:26 81%   4 Minute Liters of Oxygen 4 L  - 4 L   5 Minute Oxygen Saturation % 86 %  - 93 %   5 Minute Liters of Oxygen 4 L  - 4 L   6 Minute Oxygen Saturation %  -  - 87 %  7:00 85%   6 Minute Liters of Oxygen 4 L  - 4 L   2 Minute Post Oxygen Saturation % 100 %  - 90 %   2 Minute Post Liters of Oxygen 4 L  - 4 L   Row Name 08/08/16 1429  6 Minute Walk   Phase Discharge     Distance 512 feet     Distance % Change 50.6 %  172 ft from initial     Walk Time 4.35 minutes     # of Rest Breaks 5  4 sec, 3 sec, 7 sec, 1:15, 10 sec     MPH 1.34     METS 2     RPE 13     Perceived Dyspnea  4     VO2 Peak 4.55     Symptoms Yes (comment)     Comments SOB, fatigue     Resting HR 91 bpm     Resting BP 106/54     Max Ex. HR 114 bpm     Max Ex. BP 124/74     2 Minute Post BP 124/60       Interval HR   Baseline HR 91     1 Minute HR 112     2 Minute HR 114     3 Minute HR 106     4 Minute HR 107      5 Minute HR 114     6 Minute HR 120     2 Minute Post HR 99     Interval Heart Rate? Yes       Interval Oxygen   Interval Oxygen? Yes     Baseline Oxygen Saturation % 94 %     Baseline Liters of Oxygen 3 L     1 Minute Oxygen Saturation % 93 %  rest at 1:48 88%     1 Minute Liters of Oxygen 6 L     2 Minute Oxygen Saturation % 88 %  rest at 2:15 85,%, 83% at 2:46, recover to 90% at 3:27, resumed at 3:30     2 Minute Liters of Oxygen 6 L     3 Minute Oxygen Saturation % 86 %     3 Minute Liters of Oxygen 6 L     4 Minute Oxygen Saturation % 96 %     4 Minute Liters of Oxygen 6 L     5 Minute Oxygen Saturation % 90 %     5 Minute Liters of Oxygen 6 L     6 Minute Oxygen Saturation % 85 %     6 Minute Liters of Oxygen 6 L     2 Minute Post Oxygen Saturation % 98 %     2 Minute Post Liters of Oxygen 6 L        Initial Exercise Prescription:     Initial Exercise Prescription - 03/20/16 1300      Date of Initial Exercise RX and Referring Provider   Date 03/20/16   Referring Provider [name]     Oxygen   Oxygen Continuous   Liters 4     Treadmill   MPH 0.5   Grade 0   Minutes 15  2 then rest and repeat     NuStep   Level 1   Minutes 15  2 then rest and repeat   METs 1     Arm Ergometer   Level 1   Minutes 15  2 min then rest and repeat     T5 Nustep   Level 1   Minutes 15  2 min then rest and repeat   METs 1     Prescription Details   Frequency (times per week) 3   Duration Progress to 30 minutes of  continuous aerobic without signs/symptoms of physical distress     Intensity   THRR 40-80% of Max Heartrate 120-144   Ratings of Perceived Exertion 11-13   Perceived Dyspnea 0-4     Progression   Progression Continue to progress workloads to maintain intensity without signs/symptoms of physical distress.     Resistance Training   Training Prescription Yes   Weight 1   Reps 10-15      Perform Capillary Blood Glucose checks as needed.  Exercise  Prescription Changes:     Exercise Prescription Changes    Row Name 03/26/16 1300 04/12/16 1100 04/24/16 1500 05/09/16 1600 05/23/16 1500     Exercise Review   Progression  -  - Yes Yes Yes     Response to Exercise   Blood Pressure (Admit) 130/66 124/78 98/60 110/54 142/70   Blood Pressure (Exercise) 130/72 130/78 126/70 130/66 146/74   Blood Pressure (Exit) 120/88 120/72 100/60 112/72 122/78   Heart Rate (Admit) 92 bpm 91 bpm 100 bpm 83 bpm 89 bpm   Heart Rate (Exercise) 117 bpm 117 bpm 131 bpm 113 bpm 117 bpm   Heart Rate (Exit) 88 bpm 86 bpm 96 bpm 92 bpm 95 bpm   Oxygen Saturation (Admit) 94 % 95 % 91 % 92 % 92 %   Oxygen Saturation (Exercise) 86 %  moved up to 6L - O2 increased to 90 90 % 88 % 87 % 89 %   Oxygen Saturation (Exit) 98 % 98 % 99 % 98 % 97 %   Rating of Perceived Exertion (Exercise) _0 Perceived Dyspnea (Exercise) _1 Symptoms  -  - none none none   Comments  -  -  -  - Home Exercise Guidelines given 05/11/16   Duration Progress to 30 minutes of continuous aerobic without signs/symptoms of physical distress Progress to 45 minutes of aerobic exercise without signs/symptoms of physical distress Progress to 45 minutes of aerobic exercise without signs/symptoms of physical distress Progress to 45 minutes of aerobic exercise without signs/symptoms of physical distress Progress to 45 minutes of aerobic exercise without signs/symptoms of physical distress   Intensity _2      Progression   Progression Continue to progress workloads to maintain intensity without signs/symptoms of physical distress. Continue to progress workloads to maintain intensity without signs/symptoms of physical distress. Continue to progress workloads to maintain intensity without signs/symptoms of physical distress. Continue to progress workloads to maintain intensity without signs/symptoms of physical distress.  Continue to progress workloads to maintain intensity without signs/symptoms of physical distress.   Average METs  -  - 11.63 1.9 1.67     Resistance Training   Training Prescription Yes  - Yes Yes Yes   Weight 1  - 1 2 lbs 2 lbs   Reps 10-15  - 10-15 10-12 10-12     Interval Training   Interval Training No  - No No No     Oxygen   Oxygen _3    Liters _4 Treadmill   MPH 0.8 0.8 0.8 0.5 0.8   Grade 0 0 0 0 0   Minutes 15  2/2/2 15  6/4/_5 min x3 16  6 min 5 min 5 min 15     NuStep   Level _6 Minutes  15  5/3/_0 METs 1  - 2.3 2.1 2.4     Biostep-RELP   Level  - _1 Minutes  - _2 METs  -  - _3 Home Exercise Plan   Plans to continue exercise at  -  -  -  - Home  walking, chair exercises   Frequency  -  -  -  - Add 2 additional days to program exercise sessions.   Row Name 06/05/16 1600 06/19/16 1500 07/04/16 1500 07/31/16 1500       Exercise Review   Progression Yes Yes Yes Yes      Response to Exercise   Blood Pressure (Admit) 142/62 122/78 106/62 116/64    Blood Pressure (Exercise) 126/74 138/72 120/66 126/70    Blood Pressure (Exit) 112/60 124/60 118/74 112/62    Heart Rate (Admit) 89 bpm 101 bpm 91 bpm 99 bpm    Heart Rate (Exercise) 115 bpm 114 bpm 120 bpm 118 bpm    Heart Rate (Exit) 99 bpm 96 bpm 96 bpm 102 bpm    Oxygen Saturation (Admit) 96 % 91 % 94 % 93 %    Oxygen Saturation (Exercise) 90 % 88 % 88 % 90 %    Oxygen Saturation (Exit) 96 % 93 % 98 % 96 %    Rating of Perceived Exertion (Exercise) _4 Perceived Dyspnea (Exercise) _5 Symptoms none none none SOB, fatigue on treadmill    Comments Home Exercise Guidelines given 05/11/16 Home Exercise Guidelines given 05/11/16 Home Exercise Guidelines given 05/11/16 Home Exercise Guidelines given 05/11/16    Duration Progress to 45 minutes of aerobic exercise without  signs/symptoms of physical distress Progress to 45 minutes of aerobic exercise without signs/symptoms of physical distress Progress to 45 minutes of aerobic exercise without signs/symptoms of physical distress Progress to 45 minutes of aerobic exercise without signs/symptoms of physical distress    Intensity THRR unchanged THRR unchanged THRR unchanged THRR unchanged      Progression   Progression Continue to progress workloads to maintain intensity without signs/symptoms of physical distress. Continue to progress workloads to maintain intensity without signs/symptoms of physical distress. Continue to progress workloads to maintain intensity without signs/symptoms of physical distress. Continue to progress workloads to maintain intensity without signs/symptoms of physical distress.    Average METs 1.83 1.43 2.1 1.6      Resistance Training   Training Prescription Yes Yes Yes Yes    Weight 2 lbs 2 lbs 3 lbs 3 lbs    Reps 10-12 10-12 10-12 10-15      Interval Training   Interval Training No No No No      Oxygen   Oxygen Continuous Continuous Continuous Continuous    Liters 4 4 4-6  6L on treadmill 4-6  6L on treadmill      Treadmill   MPH 0.8 0.8 0.8 0.8    Grade 0 0 0 0    Minutes _6 NuStep   Level _7 Minutes _8 METs 1.8 1.6 2.6 1.6      Biostep-RELP   Level _9 Minutes _10 METs _11 1  Home Exercise Plan   Plans to continue exercise at Home  walking, chair exercises Home  walking, chair exercises Home  walking, chair exercises Home  walking, chair exercises    Frequency Add 2 additional days to program exercise sessions. Add 2 additional days to program exercise sessions. Add 2 additional days to program exercise sessions. Add 2 additional days to program exercise sessions.       Exercise Comments:     Exercise Comments    Row Name 03/20/16 1344 03/26/16 1325 04/12/16 1130 04/24/16 1553 05/09/16 1601    Exercise Comments Annasophia uses a wheel chair - she did not seem to have trouble with balance other than being very deconditioned and not being able to walk very far. Veryl did well for her first day of exercise.  Her sats dropped to 86 at 2 L so she uses 6L contiuous during exercise. Shawntrice is progressing well with exercise. Sirena is now getting 5 min intervals on the treadmill consistently.  She has also moved down to join the class when on the treadmill, not worrying about how she looks.  We will continue to monitor her progression. Reatha is almost half way through the program.  She is doing great.  We wil continue to monitor her progression.   [place] Name 05/11/16 1343 05/18/16 1254 05/23/16 1405 06/05/16 1634 06/19/16 1541   Exercise Comments Reviewed home exercise with pt today.  Pt plans to use pedaler at home for exercise.  Reviewed THR, pulse, RPE, sign and symptoms, and when to call 911 or MD.  Also discussed weather considerations and indoor options.  Pt voiced understanding. Pt desaturated on the treadmill to 84% on 4L O2.  Pt was using pursed lip breathing on her own without queing, O2 was turned up to 6L and pt took a rest break.  After interventions, O2 sat improved to 98% on 6L.  On the NuStep, pt self-adjusted her O2 down to 3L.  Pt remained on 3L O2 on NuStep and Biostep with saturations at 94%.  Markea continues to come to rehab for exercise.  It gets her out of the house and moving.  She is doing some on her own at home.  We will continue to encourage her to come to rehab and to exercise at home. Monchel has been doing well in rehab.  She is getting 10 min straight on the treadmill!!! We will continue to monitor her progression. Nehemiah continues to do well in rehab.  She has done a full 15 min on the treadmill!  We will continue to monitor her progression.   [place] Name 06/22/16 1210 06/27/16 1232 07/04/16 1506 07/18/16 1453 07/25/16 1239   Exercise Comments Reviewed METs average and discussed progression with pt  today. Amaryah did her full 15 minutes on the treadmill without stopping today!! Danyle did 15 full minutes on the treadmill again.  She was quite proud of herself for this accomplishment.  She is nearing graduation.  We will continue to watch her progression. Calene has been out since 07/02/16 Almetta returned today after being out due to lack of transportation while her family members have been sick.  She had the opportunity to meet with Juliann Pulse today.  We will hold off on her walk test until next week to give her a few days back in exercise.   [place] Name 07/31/16 1509 08/08/16 1439 08/10/16 1338       Exercise Comments Jylian will be graduating soon and will be doing her post  6MWT within the next two visits.  We will continue to track her progress. Eisley improved her walk test by over 50%!!!  She is exercising at home with a pedaler and doing squats. Deasia graduated today from cardiac rehab with 36 sessions completed.  Details of the patient's exercise prescription and what She needs to do in order to continue the prescription and progress were discussed with patient.  Patient was given a copy of prescription and goals.  Patient verbalized understanding.  Donnie plans to continue to exercise by walking, doing chair exercises, and using pedaler.        Discharge Exercise Prescription (Final Exercise Prescription Changes):     Exercise Prescription Changes - 07/31/16 1500      Exercise Review   Progression Yes     Response to Exercise   Blood Pressure (Admit) 116/64   Blood Pressure (Exercise) 126/70   Blood Pressure (Exit) 112/62   Heart Rate (Admit) 99 bpm   Heart Rate (Exercise) 118 bpm   Heart Rate (Exit) 102 bpm   Oxygen Saturation (Admit) 93 %   Oxygen Saturation (Exercise) 90 %   Oxygen Saturation (Exit) 96 %   Rating of Perceived Exertion (Exercise) 13   Perceived Dyspnea (Exercise) 4   Symptoms SOB, fatigue on treadmill   Comments Home Exercise Guidelines given 05/11/16   Duration Progress to 45  minutes of aerobic exercise without signs/symptoms of physical distress   Intensity THRR unchanged     Progression   Progression Continue to progress workloads to maintain intensity without signs/symptoms of physical distress.   Average METs 1.6     Resistance Training   Training Prescription Yes   Weight 3 lbs   Reps 10-15     Interval Training   Interval Training No     Oxygen   Oxygen Continuous   Liters 4-6  6L on treadmill     Treadmill   MPH 0.8   Grade 0   Minutes 15     NuStep   Level 3   Minutes 15   METs 1.6     Biostep-RELP   Level 2   Minutes 15   METs 1     Home Exercise Plan   Plans to continue exercise at Home  walking, chair exercises   Frequency Add 2 additional days to program exercise sessions.       Nutrition:  Target Goals: Understanding of nutrition guidelines, daily intake of sodium <1565m, cholesterol <2016m calories 30% from fat and 7% or less from saturated fats, daily to have 5 or more servings of fruits and vegetables.  Biometrics:     Pre Biometrics - 03/20/16 1331      Pre Biometrics   Height _0  (1.499 m)   Weight 188 lb 6.4 oz (85.5 kg)   Waist Circumference 44.5 inches   Hip Circumference 51.13 inches   Waist to Hip Ratio 0.87 %   BMI (Calculated) 38.1       Nutrition Therapy Plan and Nutrition Goals:   Nutrition Discharge: Rate Your Plate Scores:   Psychosocial: Target Goals: Acknowledge presence or absence of depression, maximize coping skills, provide positive support system. Participant is able to verbalize types and ability to use techniques and skills needed for reducing stress and depression.  Initial Review & Psychosocial Screening:     Initial Psych Review & Screening - 03/20/16 1253      Initial Review   Current issues with Current Stress Concerns   Comments JeRomie Minus  reports that she had tried to exercise in the [facility] but her father coded upstairs in the hospital and died. Marielouise said she  had not been back to exercise since then. Nandi said that she knows she needs a hip surgery but she is not sure when the doctor can do it. We discussed that Pulm Rehab can get her strength up for her upcoming hip surgery.      Family Dynamics   Good Support System? Yes   Concerns Recent loss of significant other   Comments Her father died since she has attended Pulm REhab last     Barriers   Psychosocial barriers to participate in program The patient should benefit from training in stress management and relaxation.     Screening Interventions   Interventions Encouraged to exercise      Quality of Life Scores:     Quality of Life - 06/29/16 1151      Quality of Life Scores   Health/Function Pre 20.93 %   Health/Function Post 20.4 %   Health/Function % Change -2.53 %   Socioeconomic Pre 21 %   Socioeconomic Post 27.43 %   Socioeconomic % Change  30.62 %   Psych/Spiritual Pre 21 %   Psych/Spiritual Post 27.43 %   Psych/Spiritual % Change 30.62 %   Family Pre 21 %   Family Post 30 %   Family % Change 42.86 %   GLOBAL Pre 20.97 %   GLOBAL Post 24.71 %   GLOBAL % Change 17.84 %      PHQ-9: Recent Review Flowsheet Data    Depression screen [facility] 2/9 06/29/2016 03/20/2016   Decreased Interest 0 0   Down, Depressed, Hopeless 0 0   PHQ - 2 Score 0 0   Altered sleeping 0 0   Tired, decreased energy 0 0   Change in appetite 0 0   Feeling bad or failure about yourself  0 0   Trouble concentrating 0 0   Moving slowly or fidgety/restless 0 0   Suicidal thoughts 0 0   PHQ-9 Score 0 0      Psychosocial Evaluation and Intervention:     Psychosocial Evaluation - 07/25/16 1233      Discharge Psychosocial Assessment & Intervention   Comments Counselor met with Ms. S today to discuss her discharge.  She reports progress in that she "feels better" and sometimes breathes better.  She has a current stressor discovering her insurance will not cover some of the medications she has been  on and they are too expensive for her to cover.  She is proactively searching for ways/funding to continue these.  Ms. Chauncey Cruel reports she has not been sleeping well lately and may need to start her sleep medication back soon.  Her support systems are constant although with the cold and flu season she has been isolating herself more for health reasons.  She plans to work out at home following discharge from this program and will also check back into a [facility] locally once the weather warms up.  Counselor commended Ms. S for her commitment to exercise and her progress made.  Counselor also commended her for her tenacity in finding solutions to her medication needs.        Psychosocial Re-Evaluation:     Psychosocial Re-Evaluation    [place] Name 05/07/16 1239 06/06/16 1236 06/06/16 1340         Psychosocial Re-Evaluation   Comments Counselor follow up with Ms. S today.  She reports having increased energy and walking a little better since coming in to this program.  She is now sleeping better as well and is no longer on the medications prescribed earlier.  Ms. Chauncey Cruel states her mood continues to be stable and positive and she is accomplishing many of her goals she set when she began this program.  Counselor commended her on her progress made. Romie Minus psychosocial assessment reveals no barriers at this time to participation in Pulmonary Rehab.  she has good family and friend support that encourages Kynleigh to participate in [place] and progress with Her goals.  Aliveah concerns are monitored, but she has acknowledge that attending the program has helped to maintain quality life with improved mobility, self-care, and emotional and financial stability.  Sherrice is commended for regular attendance and self-motivation to improve her pulmonary disease management. Romie Minus psychosocial assessment reveals no barriers at this time to participation in Pulmonary Rehab.  she has good family and friend support that encourages Yennifer to  participate in [place] and progress with Her goals.  Kieana concerns are monitored, but she has acknowledge that attending the program has helped to maintain quality life with improved mobility, self-care, and emotional and financial stability.  Britne is commended for regular attendance and self-motivation to improve Her pulmonary disease management.       Education: Education Goals: Education classes will be provided on a weekly basis, covering required topics. Participant will state understanding/return demonstration of topics presented.  Learning Barriers/Preferences:     Learning Barriers/Preferences - 03/20/16 1432      Learning Barriers/Preferences   Learning Barriers None   Learning Preferences Group Instruction;Individual Instruction;Pictoral;Skilled Demonstration;Verbal Instruction;Video;Written Material      Education Topics: Initial Evaluation Education: - Verbal, written and demonstration of respiratory meds, RPE/PD scales, oximetry and breathing techniques. Instruction on use of nebulizers and MDIs: cleaning and proper use, rinsing mouth with steroid doses and importance of monitoring MDI activations. Flowsheet Row Pulmonary Rehab from 08/08/2016 in [facility] Card[facility] Pulmonary Rehab  Date  03/20/16  Educator  LB  Instruction Review Code  2- meets goals/outcomes      General Nutrition Guidelines/Fats and Fiber: -Group instruction provided by verbal, written material, models and posters to present the general guidelines for heart healthy nutrition. Gives an explanation and review of dietary fats and fiber.   Controlling Sodium/Reading Food Labels: -Group verbal and written material supporting the discussion of sodium use in heart healthy nutrition. Review and explanation with models, verbal and written materials for utilization of the food label. Flowsheet Row Pulmonary Rehab from 08/08/2016 in [facility] Cardiac and Pulmonary Rehab  Date  08/06/16  Educator  CR  Instruction  Review Code  2- meets goals/outcomes      Exercise Physiology & Risk Factors: - Group verbal and written instruction with models to review the exercise physiology of the cardiovascular system and associated critical values. Details cardiovascular disease risk factors and the goals associated with each risk factor. Flowsheet Row Pulmonary Rehab from 08/08/2016 in [facility] Cardiac and Pulmonary Rehab  Date  05/30/16  Educator  AS  Instruction Review Code  2- meets goals/outcomes      Aerobic Exercise & Resistance Training: - Gives group verbal and written discussion on the health impact of inactivity. On the components of aerobic and resistive training programs and the benefits of this training and how to safely progress through these programs. Flowsheet Row Pulmonary Rehab from 08/08/2016 in [facility] Cardiac and Pulmonary Rehab  Date  04/04/16  Educator  JH/AS  Instruction Review Code  2- meets goals/outcomes      Flexibility, Balance, General Exercise Guidelines: - Provides group verbal and written instruction on the benefits of flexibility and balance training programs. Provides general exercise guidelines with specific guidelines to those with heart or lung disease. Demonstration and skill practice provided.   Stress Management: - Provides group verbal and written instruction about the health risks of elevated stress, cause of high stress, and healthy ways to reduce stress. Flowsheet Row Pulmonary Rehab from 08/08/2016 in [facility] Cardiac and Pulmonary Rehab  Date  07/25/16  Educator  [facility]  Instruction Review Code  2- meets goals/outcomes      Depression: - Provides group verbal and written instruction on the correlation between heart/lung disease and depressed mood, treatment options, and the stigmas associated with seeking treatment. Flowsheet Row Pulmonary Rehab from 08/08/2016 in [facility] Cardiac and Pulmonary Rehab  Date  06/18/16  Educator  [facility]  Instruction Review Code  2- meets  goals/outcomes      Exercise & Equipment Safety: - Individual verbal instruction and demonstration of equipment use and safety with use of the equipment. Flowsheet Row Pulmonary Rehab from 08/08/2016 in [facility] Cardiac and Pulmonary Rehab  Date  03/26/16  Educator  AS  Instruction Review Code  2- meets goals/outcomes      Infection Prevention: - Provides verbal and written material to individual with discussion of infection control including proper hand washing and proper equipment cleaning during exercise session. Flowsheet Row Pulmonary Rehab from 08/08/2016 in [facility] Cardiac and Pulmonary Rehab  Date  03/26/16  Educator  AS  Instruction Review Code  2- meets goals/outcomes      Falls Prevention: - Provides verbal and written material to individual with discussion of falls prevention and safety. Flowsheet Row Pulmonary Rehab from 08/08/2016 in [facility] Cardiac and Pulmonary Rehab  Date  03/20/16  Educator  C. Enterkin Therapist, sports  Instruction Review Code  1- partially meets, needs review/practice      Diabetes: - Individual verbal and written instruction to review signs/symptoms of diabetes, desired ranges of glucose level fasting, after meals and with exercise. Advice that pre and post exercise glucose checks will be done for 3 sessions at entry of program.   Chronic Lung Diseases: - Group verbal and written instruction to review new updates, new respiratory medications, new advancements in procedures and treatments. Provide informative websites and "800" numbers of self-education. Flowsheet Row Pulmonary Rehab from 08/08/2016 in [facility] Cardiac and Pulmonary Rehab  Date  08/08/16  Educator  LB  Instruction Review Code  2- meets goals/outcomes      Lung Procedures: - Group verbal and written instruction to describe testing methods done to diagnose lung disease. Review the outcome of test results. Describe the treatment choices: Pulmonary Function Tests, ABGs and oximetry.   Energy  Conservation: - Provide group verbal and written instruction for methods to conserve energy, plan and organize activities. Instruct on pacing techniques, use of adaptive equipment and posture/positioning to relieve shortness of breath. Flowsheet Row Pulmonary Rehab from 08/08/2016 in [facility] Cardiac and Pulmonary Rehab  Date  04/25/16  Educator  Fredderick Erb, EP  Instruction Review Code  2- meets goals/outcomes      Triggers: - Group verbal and written instruction to review types of environmental controls: home humidity, furnaces, filters, dust mite/pet prevention, HEPA vacuums. To discuss weather changes, air quality and the benefits of nasal washing. Flowsheet Row Pulmonary Rehab from 08/08/2016 in [facility] Cardiac and Pulmonary Rehab  Date  03/26/16  Educator  LB  Instruction Review Code  2- meets goals/outcomes      Exacerbations: - Group verbal and written instruction to provide: warning signs, infection symptoms, calling MD promptly, preventive modes, and value of vaccinations. Review: effective airway clearance, coughing and/or vibration techniques. Create an Sports administrator.   Oxygen: - Individual and group verbal and written instruction on oxygen therapy. Includes supplement oxygen, available portable oxygen systems, continuous and intermittent flow rates, oxygen safety, concentrators, and Medicare reimbursement for oxygen.   Respiratory Medications: - Group verbal and written instruction to review medications for lung disease. Drug class, frequency, complications, importance of spacers, rinsing mouth after steroid MDI's, and proper cleaning methods for nebulizers.   AED/CPR: - Group verbal and written instruction with the use of models to demonstrate the basic use of the AED with the basic ABC's of resuscitation. Flowsheet Row Pulmonary Rehab from 08/08/2016 in [facility] Cardiac and Pulmonary Rehab  Date  05/25/16  Educator  CE  Instruction Review Code  2- meets goals/outcomes       Breathing Retraining: - Provides individuals verbal and written instruction on purpose, frequency, and proper technique of diaphragmatic breathing and pursed-lipped breathing. Applies individual practice skills. Flowsheet Row Pulmonary Rehab from 08/08/2016 in [facility] Cardiac and Pulmonary Rehab  Date  03/20/16  Educator  C. EnterkinRN  Instruction Review Code  2- meets Designer, fashion/clothing and Physiology of the Lungs: - Group verbal and written instruction with the use of models to provide basic lung anatomy and physiology related to function, structure and complications of lung disease.   Heart Failure: - Group verbal and written instruction on the basics of heart failure: signs/symptoms, treatments, explanation of ejection fraction, enlarged heart and cardiomyopathy. Flowsheet Row Pulmonary Rehab from 08/08/2016 in [facility] Cardiac and Pulmonary Rehab  Date  06/29/16  Educator  SB  Instruction Review Code  2- meets goals/outcomes      Sleep Apnea: - Individual verbal and written instruction to review Obstructive Sleep Apnea. Review of risk factors, methods for diagnosing and types of masks and machines for OSA.   Anxiety: - Provides group, verbal and written instruction on the correlation between heart/lung disease and anxiety, treatment options, and management of anxiety. Flowsheet Row Pulmonary Rehab from 08/08/2016 in [facility] Cardiac and Pulmonary Rehab  Date  07/25/16  Educator  [facility]  Instruction Review Code  2- Meets goals/outcomes      Relaxation: - Provides group, verbal and written instruction about the benefits of relaxation for patients with heart/lung disease. Also provides patients with examples of relaxation techniques.   Knowledge Questionnaire Score:     Knowledge Questionnaire Score - 06/29/16 1150      Knowledge Questionnaire Score   Pre Score 9/10   Post Score 7/10       Core Components/Risk Factors/Patient Goals at Admission:     Personal Goals  and Risk Factors at Admission - 03/20/16 1438      Core Components/Risk Factors/Patient Goals on Admission   Develop more efficient breathing techniques such as purse lipped breathing and diaphragmatic breathing; and practicing self-pacing with activity Yes   Intervention Provide education, demonstration and support about specific breathing techniuqes utilized for more efficient breathing. Include techniques such as pursed lipped breathing, diaphragmatic breathing and self-pacing activity.   Expected Outcomes Short Term: Participant will be able to demonstrate and use breathing techniques as needed throughout daily activities.   Increase knowledge of respiratory medications and ability to use respiratory devices properly  Yes   Intervention Provide education and demonstration as needed of appropriate use of medications, inhalers, and oxygen therapy.   Expected Outcomes Short Term: Achieves understanding of medications use. Understands that oxygen is a medication prescribed by physician. Demonstrates appropriate use of inhaler and oxygen therapy.   Hypertension Yes   Intervention Provide education on lifestyle modifcations including regular physical activity/exercise, weight management, moderate sodium restriction and increased consumption of fresh fruit, vegetables, and low fat dairy, alcohol moderation, and smoking cessation.;Monitor prescription use compliance.   Expected Outcomes Short Term: Continued assessment and intervention until BP is < 140/48m HG in hypertensive participants. < 130/848mHG in hypertensive participants with diabetes, heart failure or chronic kidney disease.;Long Term: Maintenance of blood pressure at goal levels.      Core Components/Risk Factors/Patient Goals Review:      Goals and Risk Factor Review    Row Name 03/30/16 1247 04/04/16 1307 04/11/16 1254 04/24/16 0726 06/06/16 1333     Core Components/Risk Factors/Patient Goals Review   Personal Goals Review  Sedentary;Increase Strength and Stamina Weight Management/Obesity;Hypertension  - Sedentary;Increase Strength and Stamina;Develop more efficient breathing techniques such as purse lipped breathing and diaphragmatic breathing and practicing self-pacing with activity.;Improve shortness of breath with ADL's;Increase knowledge of respiratory medications and ability to use respiratory devices properly.;Hypertension  -   Review Ms. JeKeshas not doing much at home.  She is getting here three days a week for exercise.  She felt better after her last round of rehab, and hopes to do the same this time around. JeArdethaid she used to weight 129-130 before she quit smoking. JeDarrenaid then she was 150lbs when she started getting sick but now weight is up to 189lbs. Haruye's blood pressure is very good.  JeAnnaleeaid she doesn't have any problems with her inhalers. JeAlexsiaaid she knows that she can not get better but she is trying not to get  worse. Continues to use pursed lip breathing and she is on supplemental oxgyen.  Ms ShLasseigneas given a spacer today for her Symbicort and Ventolin. She has a routine for the order she takes her inhalers and this has helped her take her tyvaso. She independently adjusts her oxygen with a higher flow on the treadmill. Ms ShTiedeaintains acceptable BP's and knows the importance of lower BP for her PADaneHer exercise goals have increased , and she is very motivated to be more active , especially for her COPD management. Ms ShRandals progressing with her exercise goals. She is maintaining acceptable BP and adjusts her oxygen accordingly with her different exercise goals. Ms ShBrossmanas a good understanding of her inhalers and her tyvaso SVN for her PAH.   Expected Outcomes We will go over home exercise with JeRomie Minusnd continue to monitor for improvements in strength and stamina. To lose 2 lbs. Cont. stable blood pressure.  Continued use of her inhalers and pursed lip breathing techniques  -  Continue exercising regularly in [place] continue her management of COPD.   [place] 06/25/16 1554 07/25/16 1517 08/08/16 1442         Core Components/Risk Factors/Patient Goals Review   Personal Goals Review Sedentary;Increase Strength and Stamina;Improve shortness of breath with ADL's;Increase knowledge of respiratory medications and ability to use respiratory devices properly.;Develop more efficient breathing techniques such as purse lipped breathing and diaphragmatic breathing and practicing self-pacing with activity.;Hypertension Sedentary;Increase Strength and Stamina;Develop more efficient breathing techniques such as purse lipped breathing and diaphragmatic breathing and practicing  self-pacing with activity.;Improve shortness of breath with ADL's;Increase knowledge of respiratory medications and ability to use respiratory devices properly. Increase knowledge of respiratory medications and ability to use respiratory devices properly.     Review Ms Feigenbaum is close to graduating. We talked today about her plans for exercise after [facility]. She had been in [employer], but will not consider [place] in the winter, since she has a long drive and is worried about exercising with a large group. She states she can walk in her home. The purchase of an arm and foot pedometer is an option. She has a good understanding of her inhalers and has maintained acceptable blood pressure in [facility]. She is very competent with her oxygen and adjusting the flow rate for her different levels of excertion. In preparation for discharge from [facility], Ms Pautsch purchased a foot and arm pedometer. She uses both the leg and arm position. She is also performing some strength exercises. She has a good understanding of her respiratory medications and is compliant. Of concern to her now is a problem with grant money for her 2 PAH medications: adcirca and tyvaso which are too expensive without financial aid. Her family is  searching for help with this expense. Ms Casamento performs PLB at home and with exercise. She self adjusts her oxygen with activity between 3-4l/m. Ms Icard has attended regularly and has enjoyed the education. Ms Slinger did get grant money for her Joanie [place] and Tyvaso. She improved her post 59md by [id number]- Minimal Importance Difference for COPD is [id number] ShoCordells really enjoyed the program and plans to continue her exercise at home and use her pedometer.     Expected Outcomes Continue exercise after [facility]. Continue exercising regularly at home and self-managing her COPD by the knowledge she has gained in [place]        Core Components/Risk Factors/Patient Goals at Discharge (Final Review):      Goals and Risk Factor Review - 08/08/16 1442      Core Components/Risk Factors/Patient Goals Review   Personal Goals Review Increase knowledge of respiratory medications and ability to use respiratory devices properly.   Review Ms ShoCurfmand get grant money for her AdcJoanie Coddingtond Tyvaso. She improved her post [id number] Importance Difference for COPD is 98.66ft. 86fShoffnLavallieeally enjoyed the program and plans to continue her exercise at home and use her pedometer.      ITP Comments:     ITP Comments    Row Name 03/20/16 1251 04/12/16 1130 06/15/16 1148 07/18/16 1453     ITP Comments Mariachristina rAdrieannats that she had tried to exercise in the [facility] but her father coded upstairs in the hospital and died. Zeah sMagdalenashe had not been back to exercise since then. Corena sJalinthat she knows she needs a hip surgery but she is not sure when the doctor can do it. We discussed that Pulm Rehab can get her strength up for her upcoming hip surgery.  Deeksha iLysbethogressing well with exercise. Attended [employer] education class Called to check on Mrs. Secily. Alyiahghter has had pneumonia and not been able to drive her to class.  She hopes to return when it warms up.        Comments: Discharge ITP

## 2016-08-10 NOTE — Progress Notes (Addendum)
Daily Session Note  Patient Details  Name: Kristen Montgomery MRN: [id number] Date of Birth: 07-04-1951 Referring Provider:   Flowsheet Row Pulmonary Rehab from 03/20/2016 in [facility] Cardiac and Pulmonary Rehab  Referring Provider  [name]      Encounter Date: 08/10/2016  Check In:     Session Check In - 08/10/16 1214      Check-In   Location [facility]-Cardiac & Pulmonary Rehab   Staff Present Gerlene Burdock, RN, Levie Heritage, MA, ACSM RCEP, Exercise Physiologist;Patricia Surles RN BSN   Supervising physician immediately available to respond to emergencies [facility] immediately available ER MD   Physician(s) Dr. Kerman Passey and Dr. Quentin Cornwall   Medication changes reported     No   Fall or balance concerns reported    No   Warm-up and Cool-down Performed on first and last piece of equipment   Resistance Training Performed Yes   VAD Patient? No     VAD patient   Has back up controller? No     Pain Assessment   Currently in Pain? No/denies         Goals Met:  Proper associated with RPD/PD & O2 Sat [employer] with exercise equipment Improved SOB with ADL's Using PLB without cueing & demonstrates good technique Exercise tolerated well Personal goals reviewed Strength training completed today  Goals Unmet:  Not Applicable  Comments:  Kristen Montgomery graduated today from cardiac rehab with 36 sessions completed.  Details of the patient's exercise prescription and what She needs to do in order to continue the prescription and progress were discussed with patient.  Patient was given a copy of prescription and goals.  Patient verbalized understanding.  Kristen Montgomery plans to continue to exercise by walking, doing chair exercises, and using pedaler.     Dr. Emily Filbert is Medical Director for [facility] and [facility] Pulmonary Rehabilitation.

## 2016-08-10 NOTE — Progress Notes (Signed)
Discharge Summary  Patient Details  Name: Kristen Montgomery MRN: [id number] Date of Birth: November 06, 1950 Referring Provider:   Flowsheet Row Pulmonary Rehab from 03/20/2016 in [facility]  Referring Provider  [name]       Number of Visits: 36  Reason for Discharge:  Patient reached a stable level of exercise. Patient independent in their exercise.  Smoking History:  History  Smoking Status  . Not on file  Smokeless Tobacco  . Not on file    Diagnosis:  PAH (pulmonary artery hypertension)  ADL [facility]SD:     Pulmonary Assessment Scores    Row Name 03/20/16 1435 05/11/16 1324 06/29/16 1150     ADL UCSD   ADL Phase Entry Mid Exit   SOB Score total 68 74 86   Rest 0 0 0   Walk _0 Stairs _1 Bath _2 Dress _3 Shop 3 0 3      Initial Exercise Prescription:     Initial Exercise Prescription - 03/20/16 1300      Date of Initial Exercise RX and Referring Provider   Date 03/20/16   Referring Provider [name]     Oxygen   Oxygen Continuous   Liters 4     Treadmill   MPH 0.5   Grade 0   Minutes 15  2 then rest and repeat     NuStep   Level 1   Minutes 15  2 then rest and repeat   METs 1     Arm Ergometer   Level 1   Minutes 15  2 min then rest and repeat     T5 Nustep   Level 1   Minutes 15  2 min then rest and repeat   METs 1     Prescription Details   Frequency (times per week) 3   Duration Progress to 30 minutes of continuous aerobic without signs/symptoms of physical distress     Intensity   THRR 40-80% of Max Heartrate 120-144   Ratings of Perceived Exertion 11-13   Perceived Dyspnea 0-4     Progression   Progression Continue to progress workloads to maintain intensity without signs/symptoms of physical distress.     Resistance Training   Training Prescription Yes   Weight 1   Reps 10-15      Discharge Exercise Prescription (Final Exercise Prescription Changes):     Exercise Prescription Changes -  07/31/16 1500      Exercise Review   Progression Yes     Response to Exercise   Blood Pressure (Admit) 116/64   Blood Pressure (Exercise) 126/70   Blood Pressure (Exit) 112/62   Heart Rate (Admit) 99 bpm   Heart Rate (Exercise) 118 bpm   Heart Rate (Exit) 102 bpm   Oxygen Saturation (Admit) 93 %   Oxygen Saturation (Exercise) 90 %   Oxygen Saturation (Exit) 96 %   Rating of Perceived Exertion (Exercise) 13   Perceived Dyspnea (Exercise) 4   Symptoms SOB, fatigue on treadmill   Comments Home Exercise Guidelines given 05/11/16   Duration Progress to 45 minutes of aerobic exercise without signs/symptoms of physical distress   Intensity THRR unchanged     Progression   Progression Continue to progress workloads to maintain intensity without signs/symptoms of physical distress.   Average METs 1.6     Resistance Training   Training Prescription Yes   Weight 3 lbs  Reps 10-15     Interval Training   Interval Training No     Oxygen   Oxygen Continuous   Liters 4-6  6L on treadmill     Treadmill   MPH 0.8   Grade 0   Minutes 15     NuStep   Level 3   Minutes 15   METs 1.6     Biostep-RELP   Level 2   Minutes 15   METs 1     Home Exercise Plan   Plans to continue exercise at Home  walking, chair exercises   Frequency Add 2 additional days to program exercise sessions.      Functional Capacity:     6 Minute Walk    Row Name 03/20/16 1332 05/11/16 1331 05/11/16 1336     6 Minute Walk   Phase  - Mid Program Mid Program   Distance 340 feet  - 270 feet   Distance % Change  -  - -20.6 %   Walk Time 3 minutes  - 2.02 minutes   # of Rest Breaks 3  - 3  1:21, 20 sec, 2:18   MPH 1.28  - 1.52   METS 1.17  - 2.2   RPE 13  - 17   Perceived Dyspnea  4  - 4   VO2 Peak 4.11  - 2.95   Symptoms No  - Yes (comment)   Comments  -  - SOB, fatigue   Resting HR 96 bpm  - 96 bpm   Resting BP 128/60  - 122/62   Max Ex. HR 116 bpm  - 114 bpm   Max Ex. BP 142/62  -  126/74   2 Minute Post BP 104/64  - 122/70     Interval HR   Baseline HR 96  - 96   1 Minute HR 113  - 114   2 Minute HR 117  - 112   3 Minute HR 114  - 114   4 Minute HR  -  - 106   5 Minute HR 116  - 98   6 Minute HR  -  - 101   2 Minute Post HR 100  - 101   Interval Heart Rate? Yes  - Yes     Interval Oxygen   Interval Oxygen? Yes  - Yes   Baseline Oxygen Saturation % 93 %  - 95 %   Baseline Liters of Oxygen 4 L  - 3 L   1 Minute Oxygen Saturation % 93 %  - 91 %  57 sec 83%, 1:13 91%   1 Minute Liters of Oxygen 4 L  - 4 L   2 Minute Oxygen Saturation % 88 %  - 87 %   2 Minute Liters of Oxygen 4 L  - 4 L   3 Minute Oxygen Saturation % 93 %  - 85 %   3 Minute Liters of Oxygen 4 L  - 4 L   4 Minute Oxygen Saturation %  -  - 87 %  3:26 81%   4 Minute Liters of Oxygen 4 L  - 4 L   5 Minute Oxygen Saturation % 86 %  - 93 %   5 Minute Liters of Oxygen 4 L  - 4 L   6 Minute Oxygen Saturation %  -  - 87 %  7:00 85%   6 Minute Liters of Oxygen 4 L  - 4 L   2 Minute  Post Oxygen Saturation % 100 %  - 90 %   2 Minute Post Liters of Oxygen 4 L  - 4 L   Row Name 08/08/16 1429         6 Minute Walk   Phase Discharge     Distance 512 feet     Distance % Change 50.6 %  172 ft from initial     Walk Time 4.35 minutes     # of Rest Breaks 5  4 sec, 3 sec, 7 sec, 1:15, 10 sec     MPH 1.34     METS 2     RPE 13     Perceived Dyspnea  4     VO2 Peak 4.55     Symptoms Yes (comment)     Comments SOB, fatigue     Resting HR 91 bpm     Resting BP 106/54     Max Ex. HR 114 bpm     Max Ex. BP 124/74     2 Minute Post BP 124/60       Interval HR   Baseline HR 91     1 Minute HR 112     2 Minute HR 114     3 Minute HR 106     4 Minute HR 107     5 Minute HR 114     6 Minute HR 120     2 Minute Post HR 99     Interval Heart Rate? Yes       Interval Oxygen   Interval Oxygen? Yes     Baseline Oxygen Saturation % 94 %     Baseline Liters of Oxygen 3 L     1 Minute Oxygen  Saturation % 93 %  rest at 1:48 88%     1 Minute Liters of Oxygen 6 L     2 Minute Oxygen Saturation % 88 %  rest at 2:15 85,%, 83% at 2:46, recover to 90% at 3:27, resumed at 3:30     2 Minute Liters of Oxygen 6 L     3 Minute Oxygen Saturation % 86 %     3 Minute Liters of Oxygen 6 L     4 Minute Oxygen Saturation % 96 %     4 Minute Liters of Oxygen 6 L     5 Minute Oxygen Saturation % 90 %     5 Minute Liters of Oxygen 6 L     6 Minute Oxygen Saturation % 85 %     6 Minute Liters of Oxygen 6 L     2 Minute Post Oxygen Saturation % 98 %     2 Minute Post Liters of Oxygen 6 L        Psychological, QOL, Others - Outcomes: PHQ 2/9: Depression screen [facility] 2/9 06/29/2016 03/20/2016  Decreased Interest 0 0  Down, Depressed, Hopeless 0 0  PHQ - 2 Score 0 0  Altered sleeping 0 0  Tired, decreased energy 0 0  Change in appetite 0 0  Feeling bad or failure about yourself  0 0  Trouble concentrating 0 0  Moving slowly or fidgety/restless 0 0  Suicidal thoughts 0 0  PHQ-9 Score 0 0    Quality of Life:     Quality of Life - 06/29/16 1151      Quality of Life Scores   Health/Function Pre 20.93 %   Health/Function Post 20.4 %   Health/Function % Change -2.53 %  Socioeconomic Pre 21 %   Socioeconomic Post 27.43 %   Socioeconomic % Change  30.62 %   Psych/Spiritual Pre 21 %   Psych/Spiritual Post 27.43 %   Psych/Spiritual % Change 30.62 %   Family Pre 21 %   Family Post 30 %   Family % Change 42.86 %   GLOBAL Pre 20.97 %   GLOBAL Post 24.71 %   GLOBAL % Change 17.84 %      Personal Goals: Goals established at orientation with interventions provided to work toward goal.     Personal Goals and Risk Factors at Admission - 03/20/16 1438      Core Components/Risk Factors/Patient Goals on Admission   Develop more efficient breathing techniques such as purse lipped breathing and diaphragmatic breathing; and practicing self-pacing with activity Yes   Intervention Provide  education, demonstration and support about specific breathing techniuqes utilized for more efficient breathing. Include techniques such as pursed lipped breathing, diaphragmatic breathing and self-pacing activity.   Expected Outcomes Short Term: Participant will be able to demonstrate and use breathing techniques as needed throughout daily activities.   Increase knowledge of respiratory medications and ability to use respiratory devices properly  Yes   Intervention Provide education and demonstration as needed of appropriate use of medications, inhalers, and oxygen therapy.   Expected Outcomes Short Term: Achieves understanding of medications use. Understands that oxygen is a medication prescribed by physician. Demonstrates appropriate use of inhaler and oxygen therapy.   Hypertension Yes   Intervention Provide education on lifestyle modifcations including regular physical activity/exercise, weight management, moderate sodium restriction and increased consumption of fresh fruit, vegetables, and low fat dairy, alcohol moderation, and smoking cessation.;Monitor prescription use compliance.   Expected Outcomes Short Term: Continued assessment and intervention until BP is < 140/12m HG in hypertensive participants. < 130/873mHG in hypertensive participants with diabetes, heart failure or chronic kidney disease.;Long Term: Maintenance of blood pressure at goal levels.       Personal Goals Discharge:     Goals and Risk Factor Review    Row Name 03/30/16 1247 04/04/16 1307 04/11/16 1254 04/24/16 0726 06/06/16 1333     Core Components/Risk Factors/Patient Goals Review   Personal Goals Review Sedentary;Increase Strength and Stamina Weight Management/Obesity;Hypertension  - Sedentary;Increase Strength and Stamina;Develop more efficient breathing techniques such as purse lipped breathing and diaphragmatic breathing and practicing self-pacing with activity.;Improve shortness of breath with ADL's;Increase  knowledge of respiratory medications and ability to use respiratory devices properly.;Hypertension  -   Review Ms. JeCitlallis not doing much at home.  She is getting here three days a week for exercise.  She felt better after her last round of rehab, and hopes to do the same this time around. JeHeelaaid she used to weight 129-130 before she quit smoking. JeCynithiaaid then she was 150lbs when she started getting sick but now weight is up to 189lbs. Chimene's blood pressure is very good.  JeJennicaaid she doesn't have any problems with her inhalers. JeCoreneaid she knows that she can not get better but she is trying not to get  worse. Continues to use pursed lip breathing and she is on supplemental oxgyen.  Ms ShQueeneras given a spacer today for her Symbicort and Ventolin. She has a routine for the order she takes her inhalers and this has helped her take her tyvaso. She independently adjusts her oxygen with a higher flow on the treadmill. Ms ShDrewaintains acceptable BP's and knows the importance of  lower BP for her PAH. Her exercise goals have increased , and she is very motivated to be more active , especially for her COPD management. Ms Mates is progressing with her exercise goals. She is maintaining acceptable BP and adjusts her oxygen accordingly with her different exercise goals. Ms Hinckley has a good understanding of her inhalers and her tyvaso SVN for her PAH.   Expected Outcomes We will go over home exercise with Romie Minus and continue to monitor for improvements in strength and stamina. To lose 2 lbs. Cont. stable blood pressure.  Continued use of her inhalers and pursed lip breathing techniques  - Continue exercising regularly in [place] and continue her management of COPD.   [place] Name 06/25/16 1554 07/25/16 1517 08/08/16 1442         Core Components/Risk Factors/Patient Goals Review   Personal Goals Review Sedentary;Increase Strength and Stamina;Improve shortness of breath with ADL's;Increase knowledge of  respiratory medications and ability to use respiratory devices properly.;Develop more efficient breathing techniques such as purse lipped breathing and diaphragmatic breathing and practicing self-pacing with activity.;Hypertension Sedentary;Increase Strength and Stamina;Develop more efficient breathing techniques such as purse lipped breathing and diaphragmatic breathing and practicing self-pacing with activity.;Improve shortness of breath with ADL's;Increase knowledge of respiratory medications and ability to use respiratory devices properly. Increase knowledge of respiratory medications and ability to use respiratory devices properly.     Review Ms Malesky is close to graduating. We talked today about her plans for exercise after [facility]. She had been in [employer], but will not consider FF in the winter, since she has a long drive and is worried about exercising with a large group. She states she can walk in her home. The purchase of an arm and foot pedometer is an option. She has a good understanding of her inhalers and has maintained acceptable blood pressure in [facility]. She is very competent with her oxygen and adjusting the flow rate for her different levels of excertion. In preparation for discharge from [facility], Ms Baril purchased a foot and arm pedometer. She uses both the leg and arm position. She is also performing some strength exercises. She has a good understanding of her respiratory medications and is compliant. Of concern to her now is a problem with grant money for her 2 PAH medications: adcirca and tyvaso which are too expensive without financial aid. Her family is searching for help with this expense. Ms Nghiem performs PLB at home and with exercise. She self adjusts her oxygen with activity between 3-4l/m. Ms Laski has attended regularly and has enjoyed the education. Ms Forbis did get grant money for her Joanie [place] and Tyvaso. She improved her post 72md by 1758f- Minimal  Importance Difference for COPD is [id number] ShoMuchs really enjoyed the program and plans to continue her exercise at home and use her pedometer.     Expected Outcomes Continue exercise after [facility]. Continue exercising regularly at home and self-managing her COPD by the knowledge she has gained in [place]-        Nutrition & Weight - Outcomes:     Pre Biometrics - 03/20/16 1331      Pre Biometrics   Height _0  (1.499 m)   Weight 188 lb 6.4 oz (85.5 kg)   Waist Circumference 44.5 inches   Hip Circumference 51.13 inches   Waist to Hip Ratio 0.87 %   BMI (Calculated) 38.1       Nutrition:   Nutrition Discharge:   Education Questionnaire  Score:     Knowledge Questionnaire Score - 06/29/16 1150      Knowledge Questionnaire Score   Pre Score 9/10   Post Score 7/10      Goals reviewed with patient; copy given to patient.

## 2016-08-22 DIAGNOSIS — J449 Chronic obstructive pulmonary disease, unspecified: Secondary | ICD-10-CM | POA: Diagnosis not present

## 2016-09-02 DIAGNOSIS — I270 Primary pulmonary hypertension: Secondary | ICD-10-CM | POA: Diagnosis not present

## 2016-09-04 DIAGNOSIS — Z7951 Long term (current) use of inhaled steroids: Secondary | ICD-10-CM | POA: Diagnosis not present

## 2016-09-04 DIAGNOSIS — I10 Essential (primary) hypertension: Secondary | ICD-10-CM | POA: Diagnosis not present

## 2016-09-04 DIAGNOSIS — Z8249 Family history of ischemic heart disease and other diseases of the circulatory system: Secondary | ICD-10-CM | POA: Diagnosis not present

## 2016-09-04 DIAGNOSIS — R0902 Hypoxemia: Secondary | ICD-10-CM | POA: Diagnosis not present

## 2016-09-04 DIAGNOSIS — J449 Chronic obstructive pulmonary disease, unspecified: Secondary | ICD-10-CM | POA: Diagnosis not present

## 2016-09-04 DIAGNOSIS — Z96641 Presence of right artificial hip joint: Secondary | ICD-10-CM | POA: Diagnosis not present

## 2016-09-04 DIAGNOSIS — Z87891 Personal history of nicotine dependence: Secondary | ICD-10-CM | POA: Diagnosis not present

## 2016-09-04 DIAGNOSIS — I2721 Secondary pulmonary arterial hypertension: Secondary | ICD-10-CM | POA: Diagnosis not present

## 2016-09-04 DIAGNOSIS — G4733 Obstructive sleep apnea (adult) (pediatric): Secondary | ICD-10-CM | POA: Diagnosis not present

## 2016-09-04 DIAGNOSIS — Z7982 Long term (current) use of aspirin: Secondary | ICD-10-CM | POA: Diagnosis not present

## 2016-09-04 DIAGNOSIS — R600 Localized edema: Secondary | ICD-10-CM | POA: Diagnosis not present

## 2016-09-19 DIAGNOSIS — J449 Chronic obstructive pulmonary disease, unspecified: Secondary | ICD-10-CM | POA: Diagnosis not present

## 2016-09-30 DIAGNOSIS — I270 Primary pulmonary hypertension: Secondary | ICD-10-CM | POA: Diagnosis not present

## 2016-10-02 DIAGNOSIS — R0982 Postnasal drip: Secondary | ICD-10-CM | POA: Diagnosis not present

## 2016-10-02 DIAGNOSIS — J449 Chronic obstructive pulmonary disease, unspecified: Secondary | ICD-10-CM | POA: Diagnosis not present

## 2016-10-11 DIAGNOSIS — K219 Gastro-esophageal reflux disease without esophagitis: Secondary | ICD-10-CM | POA: Diagnosis not present

## 2016-10-11 DIAGNOSIS — I10 Essential (primary) hypertension: Secondary | ICD-10-CM | POA: Diagnosis not present

## 2016-10-11 DIAGNOSIS — J9610 Chronic respiratory failure, unspecified whether with hypoxia or hypercapnia: Secondary | ICD-10-CM | POA: Diagnosis not present

## 2016-10-11 DIAGNOSIS — J449 Chronic obstructive pulmonary disease, unspecified: Secondary | ICD-10-CM | POA: Diagnosis not present

## 2016-10-17 DIAGNOSIS — I471 Supraventricular tachycardia: Secondary | ICD-10-CM | POA: Diagnosis not present

## 2016-10-17 DIAGNOSIS — I272 Pulmonary hypertension, unspecified: Secondary | ICD-10-CM | POA: Diagnosis not present

## 2016-10-17 DIAGNOSIS — I10 Essential (primary) hypertension: Secondary | ICD-10-CM | POA: Diagnosis not present

## 2016-10-17 DIAGNOSIS — I50812 Chronic right heart failure: Secondary | ICD-10-CM | POA: Diagnosis not present

## 2016-10-20 DIAGNOSIS — J449 Chronic obstructive pulmonary disease, unspecified: Secondary | ICD-10-CM | POA: Diagnosis not present

## 2016-10-26 DIAGNOSIS — J449 Chronic obstructive pulmonary disease, unspecified: Secondary | ICD-10-CM | POA: Diagnosis not present

## 2016-10-26 DIAGNOSIS — R0982 Postnasal drip: Secondary | ICD-10-CM | POA: Diagnosis not present

## 2016-10-28 DIAGNOSIS — I270 Primary pulmonary hypertension: Secondary | ICD-10-CM | POA: Diagnosis not present

## 2016-10-29 DIAGNOSIS — J449 Chronic obstructive pulmonary disease, unspecified: Secondary | ICD-10-CM | POA: Diagnosis not present

## 2016-11-13 DIAGNOSIS — Z139 Encounter for screening, unspecified: Secondary | ICD-10-CM | POA: Diagnosis not present

## 2016-11-13 DIAGNOSIS — Z0000 Encounter for general adult medical examination without abnormal findings: Secondary | ICD-10-CM | POA: Diagnosis not present

## 2016-11-13 DIAGNOSIS — Z1389 Encounter for screening for other disorder: Secondary | ICD-10-CM | POA: Diagnosis not present

## 2016-11-19 DIAGNOSIS — J449 Chronic obstructive pulmonary disease, unspecified: Secondary | ICD-10-CM | POA: Diagnosis not present

## 2016-11-25 DIAGNOSIS — I270 Primary pulmonary hypertension: Secondary | ICD-10-CM | POA: Diagnosis not present

## 2016-11-26 DIAGNOSIS — I058 Other rheumatic mitral valve diseases: Secondary | ICD-10-CM | POA: Diagnosis not present

## 2016-11-26 DIAGNOSIS — I2721 Secondary pulmonary arterial hypertension: Secondary | ICD-10-CM | POA: Diagnosis not present

## 2016-11-26 DIAGNOSIS — G4733 Obstructive sleep apnea (adult) (pediatric): Secondary | ICD-10-CM | POA: Diagnosis not present

## 2016-11-26 DIAGNOSIS — I519 Heart disease, unspecified: Secondary | ICD-10-CM | POA: Diagnosis not present

## 2016-11-26 DIAGNOSIS — I361 Nonrheumatic tricuspid (valve) insufficiency: Secondary | ICD-10-CM | POA: Diagnosis not present

## 2016-11-26 DIAGNOSIS — J449 Chronic obstructive pulmonary disease, unspecified: Secondary | ICD-10-CM | POA: Diagnosis not present

## 2016-12-07 DIAGNOSIS — I2721 Secondary pulmonary arterial hypertension: Secondary | ICD-10-CM | POA: Diagnosis not present

## 2016-12-07 DIAGNOSIS — J449 Chronic obstructive pulmonary disease, unspecified: Secondary | ICD-10-CM | POA: Diagnosis not present

## 2016-12-07 DIAGNOSIS — I10 Essential (primary) hypertension: Secondary | ICD-10-CM | POA: Diagnosis not present

## 2016-12-07 DIAGNOSIS — G4733 Obstructive sleep apnea (adult) (pediatric): Secondary | ICD-10-CM | POA: Diagnosis not present

## 2016-12-07 DIAGNOSIS — Z87891 Personal history of nicotine dependence: Secondary | ICD-10-CM | POA: Diagnosis not present

## 2016-12-07 DIAGNOSIS — Z7982 Long term (current) use of aspirin: Secondary | ICD-10-CM | POA: Diagnosis not present

## 2016-12-10 DIAGNOSIS — S40862A Insect bite (nonvenomous) of left upper arm, initial encounter: Secondary | ICD-10-CM | POA: Diagnosis not present

## 2016-12-13 DIAGNOSIS — J441 Chronic obstructive pulmonary disease with (acute) exacerbation: Secondary | ICD-10-CM | POA: Diagnosis not present

## 2016-12-20 DIAGNOSIS — J449 Chronic obstructive pulmonary disease, unspecified: Secondary | ICD-10-CM | POA: Diagnosis not present

## 2016-12-23 DIAGNOSIS — I270 Primary pulmonary hypertension: Secondary | ICD-10-CM | POA: Diagnosis not present

## 2016-12-31 DIAGNOSIS — H25813 Combined forms of age-related cataract, bilateral: Secondary | ICD-10-CM | POA: Diagnosis not present

## 2016-12-31 DIAGNOSIS — H5213 Myopia, bilateral: Secondary | ICD-10-CM | POA: Diagnosis not present

## 2017-01-09 DIAGNOSIS — G4733 Obstructive sleep apnea (adult) (pediatric): Secondary | ICD-10-CM | POA: Diagnosis not present

## 2017-01-19 DIAGNOSIS — J449 Chronic obstructive pulmonary disease, unspecified: Secondary | ICD-10-CM | POA: Diagnosis not present

## 2017-01-20 DIAGNOSIS — I270 Primary pulmonary hypertension: Secondary | ICD-10-CM | POA: Diagnosis not present

## 2017-02-04 DIAGNOSIS — G4733 Obstructive sleep apnea (adult) (pediatric): Secondary | ICD-10-CM | POA: Diagnosis not present

## 2017-02-04 DIAGNOSIS — J449 Chronic obstructive pulmonary disease, unspecified: Secondary | ICD-10-CM | POA: Diagnosis not present

## 2017-02-08 DIAGNOSIS — G4733 Obstructive sleep apnea (adult) (pediatric): Secondary | ICD-10-CM | POA: Diagnosis not present

## 2017-02-15 DIAGNOSIS — J0100 Acute maxillary sinusitis, unspecified: Secondary | ICD-10-CM | POA: Diagnosis not present

## 2017-02-17 DIAGNOSIS — I270 Primary pulmonary hypertension: Secondary | ICD-10-CM | POA: Diagnosis not present

## 2017-02-19 DIAGNOSIS — J449 Chronic obstructive pulmonary disease, unspecified: Secondary | ICD-10-CM | POA: Diagnosis not present

## 2017-03-05 DIAGNOSIS — I10 Essential (primary) hypertension: Secondary | ICD-10-CM | POA: Diagnosis not present

## 2017-03-05 DIAGNOSIS — Z87891 Personal history of nicotine dependence: Secondary | ICD-10-CM | POA: Diagnosis not present

## 2017-03-05 DIAGNOSIS — I2721 Secondary pulmonary arterial hypertension: Secondary | ICD-10-CM | POA: Diagnosis not present

## 2017-03-05 DIAGNOSIS — Z7982 Long term (current) use of aspirin: Secondary | ICD-10-CM | POA: Diagnosis not present

## 2017-03-05 DIAGNOSIS — I272 Pulmonary hypertension, unspecified: Secondary | ICD-10-CM | POA: Diagnosis not present

## 2017-03-05 DIAGNOSIS — J449 Chronic obstructive pulmonary disease, unspecified: Secondary | ICD-10-CM | POA: Diagnosis not present

## 2017-03-05 DIAGNOSIS — R0602 Shortness of breath: Secondary | ICD-10-CM | POA: Diagnosis not present

## 2017-03-05 DIAGNOSIS — Z79899 Other long term (current) drug therapy: Secondary | ICD-10-CM | POA: Diagnosis not present

## 2017-03-07 DIAGNOSIS — G4733 Obstructive sleep apnea (adult) (pediatric): Secondary | ICD-10-CM | POA: Diagnosis not present

## 2017-03-07 DIAGNOSIS — J449 Chronic obstructive pulmonary disease, unspecified: Secondary | ICD-10-CM | POA: Diagnosis not present

## 2017-03-12 DIAGNOSIS — H25813 Combined forms of age-related cataract, bilateral: Secondary | ICD-10-CM | POA: Diagnosis not present

## 2017-03-17 DIAGNOSIS — I270 Primary pulmonary hypertension: Secondary | ICD-10-CM | POA: Diagnosis not present

## 2017-03-22 DIAGNOSIS — J9610 Chronic respiratory failure, unspecified whether with hypoxia or hypercapnia: Secondary | ICD-10-CM | POA: Diagnosis not present

## 2017-03-22 DIAGNOSIS — Z139 Encounter for screening, unspecified: Secondary | ICD-10-CM | POA: Diagnosis not present

## 2017-03-22 DIAGNOSIS — Z23 Encounter for immunization: Secondary | ICD-10-CM | POA: Diagnosis not present

## 2017-03-22 DIAGNOSIS — J449 Chronic obstructive pulmonary disease, unspecified: Secondary | ICD-10-CM | POA: Diagnosis not present

## 2017-03-22 DIAGNOSIS — Z9981 Dependence on supplemental oxygen: Secondary | ICD-10-CM | POA: Diagnosis not present

## 2017-03-25 DIAGNOSIS — I471 Supraventricular tachycardia: Secondary | ICD-10-CM | POA: Diagnosis not present

## 2017-03-25 DIAGNOSIS — I10 Essential (primary) hypertension: Secondary | ICD-10-CM | POA: Diagnosis not present

## 2017-03-25 DIAGNOSIS — Z7982 Long term (current) use of aspirin: Secondary | ICD-10-CM | POA: Diagnosis not present

## 2017-03-25 DIAGNOSIS — J449 Chronic obstructive pulmonary disease, unspecified: Secondary | ICD-10-CM | POA: Diagnosis not present

## 2017-03-25 DIAGNOSIS — Z96641 Presence of right artificial hip joint: Secondary | ICD-10-CM | POA: Diagnosis not present

## 2017-03-25 DIAGNOSIS — H2512 Age-related nuclear cataract, left eye: Secondary | ICD-10-CM | POA: Diagnosis not present

## 2017-04-04 DIAGNOSIS — J449 Chronic obstructive pulmonary disease, unspecified: Secondary | ICD-10-CM | POA: Diagnosis not present

## 2017-04-07 DIAGNOSIS — G4733 Obstructive sleep apnea (adult) (pediatric): Secondary | ICD-10-CM | POA: Diagnosis not present

## 2017-04-07 DIAGNOSIS — J449 Chronic obstructive pulmonary disease, unspecified: Secondary | ICD-10-CM | POA: Diagnosis not present

## 2017-04-08 DIAGNOSIS — J439 Emphysema, unspecified: Secondary | ICD-10-CM | POA: Diagnosis not present

## 2017-04-08 DIAGNOSIS — Z96641 Presence of right artificial hip joint: Secondary | ICD-10-CM | POA: Diagnosis not present

## 2017-04-08 DIAGNOSIS — H25011 Cortical age-related cataract, right eye: Secondary | ICD-10-CM | POA: Diagnosis not present

## 2017-04-08 DIAGNOSIS — K219 Gastro-esophageal reflux disease without esophagitis: Secondary | ICD-10-CM | POA: Diagnosis not present

## 2017-04-08 DIAGNOSIS — I471 Supraventricular tachycardia: Secondary | ICD-10-CM | POA: Diagnosis not present

## 2017-04-08 DIAGNOSIS — H2511 Age-related nuclear cataract, right eye: Secondary | ICD-10-CM | POA: Diagnosis not present

## 2017-04-08 DIAGNOSIS — Z888 Allergy status to other drugs, medicaments and biological substances status: Secondary | ICD-10-CM | POA: Diagnosis not present

## 2017-04-08 DIAGNOSIS — I10 Essential (primary) hypertension: Secondary | ICD-10-CM | POA: Diagnosis not present

## 2017-04-08 DIAGNOSIS — Z886 Allergy status to analgesic agent status: Secondary | ICD-10-CM | POA: Diagnosis not present

## 2017-04-14 DIAGNOSIS — I270 Primary pulmonary hypertension: Secondary | ICD-10-CM | POA: Diagnosis not present

## 2017-04-21 DIAGNOSIS — J45998 Other asthma: Secondary | ICD-10-CM | POA: Diagnosis not present

## 2017-04-21 DIAGNOSIS — J449 Chronic obstructive pulmonary disease, unspecified: Secondary | ICD-10-CM | POA: Diagnosis not present

## 2017-04-21 DIAGNOSIS — J441 Chronic obstructive pulmonary disease with (acute) exacerbation: Secondary | ICD-10-CM | POA: Diagnosis not present

## 2017-05-01 DIAGNOSIS — E782 Mixed hyperlipidemia: Secondary | ICD-10-CM | POA: Diagnosis not present

## 2017-05-01 DIAGNOSIS — I129 Hypertensive chronic kidney disease with stage 1 through stage 4 chronic kidney disease, or unspecified chronic kidney disease: Secondary | ICD-10-CM | POA: Diagnosis not present

## 2017-05-07 DIAGNOSIS — J449 Chronic obstructive pulmonary disease, unspecified: Secondary | ICD-10-CM | POA: Diagnosis not present

## 2017-05-07 DIAGNOSIS — G4733 Obstructive sleep apnea (adult) (pediatric): Secondary | ICD-10-CM | POA: Diagnosis not present

## 2017-05-12 DIAGNOSIS — I270 Primary pulmonary hypertension: Secondary | ICD-10-CM | POA: Diagnosis not present

## 2017-05-13 DIAGNOSIS — I471 Supraventricular tachycardia: Secondary | ICD-10-CM | POA: Diagnosis not present

## 2017-05-13 DIAGNOSIS — I10 Essential (primary) hypertension: Secondary | ICD-10-CM | POA: Diagnosis not present

## 2017-05-13 DIAGNOSIS — J449 Chronic obstructive pulmonary disease, unspecified: Secondary | ICD-10-CM | POA: Diagnosis not present

## 2017-05-13 DIAGNOSIS — I272 Pulmonary hypertension, unspecified: Secondary | ICD-10-CM | POA: Diagnosis not present

## 2017-05-20 DIAGNOSIS — L259 Unspecified contact dermatitis, unspecified cause: Secondary | ICD-10-CM | POA: Diagnosis not present

## 2017-05-22 DIAGNOSIS — J449 Chronic obstructive pulmonary disease, unspecified: Secondary | ICD-10-CM | POA: Diagnosis not present

## 2017-05-22 DIAGNOSIS — J45998 Other asthma: Secondary | ICD-10-CM | POA: Diagnosis not present

## 2017-05-22 DIAGNOSIS — J441 Chronic obstructive pulmonary disease with (acute) exacerbation: Secondary | ICD-10-CM | POA: Diagnosis not present

## 2017-06-07 DIAGNOSIS — G4733 Obstructive sleep apnea (adult) (pediatric): Secondary | ICD-10-CM | POA: Diagnosis not present

## 2017-06-07 DIAGNOSIS — J449 Chronic obstructive pulmonary disease, unspecified: Secondary | ICD-10-CM | POA: Diagnosis not present

## 2017-06-09 DIAGNOSIS — I270 Primary pulmonary hypertension: Secondary | ICD-10-CM | POA: Diagnosis not present

## 2017-06-21 DIAGNOSIS — J449 Chronic obstructive pulmonary disease, unspecified: Secondary | ICD-10-CM | POA: Diagnosis not present

## 2017-07-07 DIAGNOSIS — I270 Primary pulmonary hypertension: Secondary | ICD-10-CM | POA: Diagnosis not present

## 2017-07-07 DIAGNOSIS — G4733 Obstructive sleep apnea (adult) (pediatric): Secondary | ICD-10-CM | POA: Diagnosis not present

## 2017-07-07 DIAGNOSIS — J449 Chronic obstructive pulmonary disease, unspecified: Secondary | ICD-10-CM | POA: Diagnosis not present

## 2017-07-18 DIAGNOSIS — J449 Chronic obstructive pulmonary disease, unspecified: Secondary | ICD-10-CM | POA: Diagnosis not present

## 2017-07-22 DIAGNOSIS — J449 Chronic obstructive pulmonary disease, unspecified: Secondary | ICD-10-CM | POA: Diagnosis not present

## 2017-07-26 DIAGNOSIS — I2721 Secondary pulmonary arterial hypertension: Secondary | ICD-10-CM | POA: Diagnosis not present

## 2017-07-26 DIAGNOSIS — Z23 Encounter for immunization: Secondary | ICD-10-CM | POA: Diagnosis not present

## 2017-07-26 DIAGNOSIS — I272 Pulmonary hypertension, unspecified: Secondary | ICD-10-CM | POA: Diagnosis not present

## 2017-07-26 DIAGNOSIS — J449 Chronic obstructive pulmonary disease, unspecified: Secondary | ICD-10-CM | POA: Diagnosis not present

## 2017-07-26 DIAGNOSIS — M25559 Pain in unspecified hip: Secondary | ICD-10-CM | POA: Diagnosis not present

## 2017-07-26 DIAGNOSIS — I471 Supraventricular tachycardia: Secondary | ICD-10-CM | POA: Diagnosis not present

## 2017-07-26 DIAGNOSIS — I10 Essential (primary) hypertension: Secondary | ICD-10-CM | POA: Diagnosis not present

## 2017-07-26 DIAGNOSIS — Z9981 Dependence on supplemental oxygen: Secondary | ICD-10-CM | POA: Diagnosis not present

## 2017-07-26 DIAGNOSIS — R600 Localized edema: Secondary | ICD-10-CM | POA: Diagnosis not present

## 2017-07-26 DIAGNOSIS — Z79899 Other long term (current) drug therapy: Secondary | ICD-10-CM | POA: Diagnosis not present

## 2017-07-26 DIAGNOSIS — G4733 Obstructive sleep apnea (adult) (pediatric): Secondary | ICD-10-CM | POA: Diagnosis not present

## 2017-07-26 DIAGNOSIS — Z7982 Long term (current) use of aspirin: Secondary | ICD-10-CM | POA: Diagnosis not present

## 2017-07-26 DIAGNOSIS — Z7951 Long term (current) use of inhaled steroids: Secondary | ICD-10-CM | POA: Diagnosis not present

## 2017-07-26 DIAGNOSIS — Z87891 Personal history of nicotine dependence: Secondary | ICD-10-CM | POA: Diagnosis not present

## 2017-08-04 DIAGNOSIS — I270 Primary pulmonary hypertension: Secondary | ICD-10-CM | POA: Diagnosis not present

## 2017-08-18 DIAGNOSIS — J449 Chronic obstructive pulmonary disease, unspecified: Secondary | ICD-10-CM | POA: Diagnosis not present

## 2017-08-19 DIAGNOSIS — J449 Chronic obstructive pulmonary disease, unspecified: Secondary | ICD-10-CM | POA: Diagnosis not present

## 2017-08-19 DIAGNOSIS — J029 Acute pharyngitis, unspecified: Secondary | ICD-10-CM | POA: Diagnosis not present

## 2017-08-19 DIAGNOSIS — R05 Cough: Secondary | ICD-10-CM | POA: Diagnosis not present

## 2017-08-19 DIAGNOSIS — J9610 Chronic respiratory failure, unspecified whether with hypoxia or hypercapnia: Secondary | ICD-10-CM | POA: Diagnosis not present

## 2017-08-22 DIAGNOSIS — J449 Chronic obstructive pulmonary disease, unspecified: Secondary | ICD-10-CM | POA: Diagnosis not present

## 2017-09-01 DIAGNOSIS — I270 Primary pulmonary hypertension: Secondary | ICD-10-CM | POA: Diagnosis not present

## 2017-09-15 DIAGNOSIS — J449 Chronic obstructive pulmonary disease, unspecified: Secondary | ICD-10-CM | POA: Diagnosis not present

## 2017-09-19 DIAGNOSIS — J449 Chronic obstructive pulmonary disease, unspecified: Secondary | ICD-10-CM | POA: Diagnosis not present

## 2017-09-26 DIAGNOSIS — J449 Chronic obstructive pulmonary disease, unspecified: Secondary | ICD-10-CM | POA: Diagnosis not present

## 2017-09-29 DIAGNOSIS — J449 Chronic obstructive pulmonary disease, unspecified: Secondary | ICD-10-CM | POA: Diagnosis not present

## 2017-09-29 DIAGNOSIS — J069 Acute upper respiratory infection, unspecified: Secondary | ICD-10-CM | POA: Diagnosis not present

## 2017-09-29 DIAGNOSIS — I270 Primary pulmonary hypertension: Secondary | ICD-10-CM | POA: Diagnosis not present

## 2017-10-02 DIAGNOSIS — Z139 Encounter for screening, unspecified: Secondary | ICD-10-CM | POA: Diagnosis not present

## 2017-10-02 DIAGNOSIS — Z0000 Encounter for general adult medical examination without abnormal findings: Secondary | ICD-10-CM | POA: Diagnosis not present

## 2017-10-02 DIAGNOSIS — Z1331 Encounter for screening for depression: Secondary | ICD-10-CM | POA: Diagnosis not present

## 2017-10-16 DIAGNOSIS — J449 Chronic obstructive pulmonary disease, unspecified: Secondary | ICD-10-CM | POA: Diagnosis not present

## 2017-10-20 DIAGNOSIS — J449 Chronic obstructive pulmonary disease, unspecified: Secondary | ICD-10-CM | POA: Diagnosis not present

## 2017-10-22 DIAGNOSIS — G4733 Obstructive sleep apnea (adult) (pediatric): Secondary | ICD-10-CM | POA: Diagnosis not present

## 2017-10-27 DIAGNOSIS — I270 Primary pulmonary hypertension: Secondary | ICD-10-CM | POA: Diagnosis not present

## 2017-10-29 DIAGNOSIS — I2721 Secondary pulmonary arterial hypertension: Secondary | ICD-10-CM | POA: Diagnosis not present

## 2017-10-29 DIAGNOSIS — R0602 Shortness of breath: Secondary | ICD-10-CM | POA: Diagnosis not present

## 2017-10-29 DIAGNOSIS — G4733 Obstructive sleep apnea (adult) (pediatric): Secondary | ICD-10-CM | POA: Diagnosis not present

## 2017-10-29 DIAGNOSIS — J449 Chronic obstructive pulmonary disease, unspecified: Secondary | ICD-10-CM | POA: Diagnosis not present

## 2017-11-05 DIAGNOSIS — G4733 Obstructive sleep apnea (adult) (pediatric): Secondary | ICD-10-CM | POA: Diagnosis not present

## 2017-11-05 DIAGNOSIS — J449 Chronic obstructive pulmonary disease, unspecified: Secondary | ICD-10-CM | POA: Diagnosis not present

## 2017-11-11 DIAGNOSIS — J441 Chronic obstructive pulmonary disease with (acute) exacerbation: Secondary | ICD-10-CM | POA: Diagnosis not present

## 2017-11-11 DIAGNOSIS — J309 Allergic rhinitis, unspecified: Secondary | ICD-10-CM | POA: Diagnosis not present

## 2017-11-12 DIAGNOSIS — I272 Pulmonary hypertension, unspecified: Secondary | ICD-10-CM | POA: Diagnosis not present

## 2017-11-12 DIAGNOSIS — Z87891 Personal history of nicotine dependence: Secondary | ICD-10-CM | POA: Diagnosis not present

## 2017-11-12 DIAGNOSIS — J441 Chronic obstructive pulmonary disease with (acute) exacerbation: Secondary | ICD-10-CM | POA: Diagnosis not present

## 2017-11-12 DIAGNOSIS — J9690 Respiratory failure, unspecified, unspecified whether with hypoxia or hypercapnia: Secondary | ICD-10-CM | POA: Diagnosis not present

## 2017-11-12 DIAGNOSIS — R069 Unspecified abnormalities of breathing: Secondary | ICD-10-CM | POA: Diagnosis not present

## 2017-11-12 DIAGNOSIS — Z9989 Dependence on other enabling machines and devices: Secondary | ICD-10-CM | POA: Diagnosis not present

## 2017-11-12 DIAGNOSIS — J9602 Acute respiratory failure with hypercapnia: Secondary | ICD-10-CM | POA: Diagnosis not present

## 2017-11-12 DIAGNOSIS — Z8679 Personal history of other diseases of the circulatory system: Secondary | ICD-10-CM | POA: Diagnosis not present

## 2017-11-12 DIAGNOSIS — Z888 Allergy status to other drugs, medicaments and biological substances status: Secondary | ICD-10-CM | POA: Diagnosis not present

## 2017-11-12 DIAGNOSIS — I959 Hypotension, unspecified: Secondary | ICD-10-CM | POA: Diagnosis not present

## 2017-11-12 DIAGNOSIS — R0602 Shortness of breath: Secondary | ICD-10-CM | POA: Diagnosis not present

## 2017-11-12 DIAGNOSIS — Z4682 Encounter for fitting and adjustment of non-vascular catheter: Secondary | ICD-10-CM | POA: Diagnosis not present

## 2017-11-12 DIAGNOSIS — Z452 Encounter for adjustment and management of vascular access device: Secondary | ICD-10-CM | POA: Diagnosis not present

## 2017-11-12 DIAGNOSIS — J9601 Acute respiratory failure with hypoxia: Secondary | ICD-10-CM | POA: Diagnosis not present

## 2017-11-12 DIAGNOSIS — J449 Chronic obstructive pulmonary disease, unspecified: Secondary | ICD-10-CM | POA: Diagnosis not present

## 2017-11-13 DIAGNOSIS — E872 Acidosis: Secondary | ICD-10-CM | POA: Diagnosis not present

## 2017-11-13 DIAGNOSIS — R109 Unspecified abdominal pain: Secondary | ICD-10-CM | POA: Diagnosis not present

## 2017-11-13 DIAGNOSIS — I959 Hypotension, unspecified: Secondary | ICD-10-CM | POA: Diagnosis not present

## 2017-11-13 DIAGNOSIS — Z4682 Encounter for fitting and adjustment of non-vascular catheter: Secondary | ICD-10-CM | POA: Diagnosis not present

## 2017-11-13 DIAGNOSIS — J9621 Acute and chronic respiratory failure with hypoxia: Secondary | ICD-10-CM | POA: Diagnosis not present

## 2017-11-13 DIAGNOSIS — R0600 Dyspnea, unspecified: Secondary | ICD-10-CM | POA: Diagnosis not present

## 2017-11-13 DIAGNOSIS — I110 Hypertensive heart disease with heart failure: Secondary | ICD-10-CM | POA: Diagnosis not present

## 2017-11-13 DIAGNOSIS — J440 Chronic obstructive pulmonary disease with acute lower respiratory infection: Secondary | ICD-10-CM | POA: Diagnosis not present

## 2017-11-13 DIAGNOSIS — Z9911 Dependence on respirator [ventilator] status: Secondary | ICD-10-CM | POA: Diagnosis not present

## 2017-11-13 DIAGNOSIS — I50810 Right heart failure, unspecified: Secondary | ICD-10-CM | POA: Diagnosis not present

## 2017-11-13 DIAGNOSIS — I10 Essential (primary) hypertension: Secondary | ICD-10-CM | POA: Diagnosis not present

## 2017-11-13 DIAGNOSIS — R000 Tachycardia, unspecified: Secondary | ICD-10-CM | POA: Diagnosis not present

## 2017-11-13 DIAGNOSIS — I272 Pulmonary hypertension, unspecified: Secondary | ICD-10-CM | POA: Diagnosis not present

## 2017-11-13 DIAGNOSIS — I499 Cardiac arrhythmia, unspecified: Secondary | ICD-10-CM | POA: Diagnosis not present

## 2017-11-13 DIAGNOSIS — E871 Hypo-osmolality and hyponatremia: Secondary | ICD-10-CM | POA: Diagnosis not present

## 2017-11-13 DIAGNOSIS — J449 Chronic obstructive pulmonary disease, unspecified: Secondary | ICD-10-CM | POA: Diagnosis not present

## 2017-11-13 DIAGNOSIS — J441 Chronic obstructive pulmonary disease with (acute) exacerbation: Secondary | ICD-10-CM | POA: Diagnosis not present

## 2017-11-13 DIAGNOSIS — B9789 Other viral agents as the cause of diseases classified elsewhere: Secondary | ICD-10-CM | POA: Diagnosis not present

## 2017-11-13 DIAGNOSIS — N179 Acute kidney failure, unspecified: Secondary | ICD-10-CM | POA: Diagnosis not present

## 2017-11-13 DIAGNOSIS — J9602 Acute respiratory failure with hypercapnia: Secondary | ICD-10-CM | POA: Diagnosis not present

## 2017-11-13 DIAGNOSIS — I2721 Secondary pulmonary arterial hypertension: Secondary | ICD-10-CM | POA: Diagnosis not present

## 2017-11-13 DIAGNOSIS — J9622 Acute and chronic respiratory failure with hypercapnia: Secondary | ICD-10-CM | POA: Diagnosis not present

## 2017-11-13 DIAGNOSIS — E861 Hypovolemia: Secondary | ICD-10-CM | POA: Diagnosis not present

## 2017-11-19 DIAGNOSIS — I272 Pulmonary hypertension, unspecified: Secondary | ICD-10-CM | POA: Diagnosis not present

## 2017-11-19 DIAGNOSIS — J9611 Chronic respiratory failure with hypoxia: Secondary | ICD-10-CM | POA: Diagnosis not present

## 2017-11-19 DIAGNOSIS — I110 Hypertensive heart disease with heart failure: Secondary | ICD-10-CM | POA: Diagnosis not present

## 2017-11-19 DIAGNOSIS — J9612 Chronic respiratory failure with hypercapnia: Secondary | ICD-10-CM | POA: Diagnosis not present

## 2017-11-19 DIAGNOSIS — I50810 Right heart failure, unspecified: Secondary | ICD-10-CM | POA: Diagnosis not present

## 2017-11-19 DIAGNOSIS — Z9181 History of falling: Secondary | ICD-10-CM | POA: Diagnosis not present

## 2017-11-19 DIAGNOSIS — Z7982 Long term (current) use of aspirin: Secondary | ICD-10-CM | POA: Diagnosis not present

## 2017-11-19 DIAGNOSIS — J449 Chronic obstructive pulmonary disease, unspecified: Secondary | ICD-10-CM | POA: Diagnosis not present

## 2017-11-20 DIAGNOSIS — I272 Pulmonary hypertension, unspecified: Secondary | ICD-10-CM | POA: Diagnosis not present

## 2017-11-20 DIAGNOSIS — I50810 Right heart failure, unspecified: Secondary | ICD-10-CM | POA: Diagnosis not present

## 2017-11-20 DIAGNOSIS — Z7982 Long term (current) use of aspirin: Secondary | ICD-10-CM | POA: Diagnosis not present

## 2017-11-20 DIAGNOSIS — J9612 Chronic respiratory failure with hypercapnia: Secondary | ICD-10-CM | POA: Diagnosis not present

## 2017-11-20 DIAGNOSIS — I110 Hypertensive heart disease with heart failure: Secondary | ICD-10-CM | POA: Diagnosis not present

## 2017-11-20 DIAGNOSIS — J449 Chronic obstructive pulmonary disease, unspecified: Secondary | ICD-10-CM | POA: Diagnosis not present

## 2017-11-20 DIAGNOSIS — J9611 Chronic respiratory failure with hypoxia: Secondary | ICD-10-CM | POA: Diagnosis not present

## 2017-11-20 DIAGNOSIS — Z9181 History of falling: Secondary | ICD-10-CM | POA: Diagnosis not present

## 2017-11-22 DIAGNOSIS — J449 Chronic obstructive pulmonary disease, unspecified: Secondary | ICD-10-CM | POA: Diagnosis not present

## 2017-11-22 DIAGNOSIS — I272 Pulmonary hypertension, unspecified: Secondary | ICD-10-CM | POA: Diagnosis not present

## 2017-11-22 DIAGNOSIS — G4733 Obstructive sleep apnea (adult) (pediatric): Secondary | ICD-10-CM | POA: Diagnosis not present

## 2017-11-22 DIAGNOSIS — I2721 Secondary pulmonary arterial hypertension: Secondary | ICD-10-CM | POA: Diagnosis not present

## 2017-11-22 DIAGNOSIS — R0602 Shortness of breath: Secondary | ICD-10-CM | POA: Diagnosis not present

## 2017-11-24 DIAGNOSIS — I270 Primary pulmonary hypertension: Secondary | ICD-10-CM | POA: Diagnosis not present

## 2017-11-26 DIAGNOSIS — Z7982 Long term (current) use of aspirin: Secondary | ICD-10-CM | POA: Diagnosis not present

## 2017-11-26 DIAGNOSIS — J9612 Chronic respiratory failure with hypercapnia: Secondary | ICD-10-CM | POA: Diagnosis not present

## 2017-11-26 DIAGNOSIS — Z9181 History of falling: Secondary | ICD-10-CM | POA: Diagnosis not present

## 2017-11-26 DIAGNOSIS — I110 Hypertensive heart disease with heart failure: Secondary | ICD-10-CM | POA: Diagnosis not present

## 2017-11-26 DIAGNOSIS — I50810 Right heart failure, unspecified: Secondary | ICD-10-CM | POA: Diagnosis not present

## 2017-11-26 DIAGNOSIS — J9611 Chronic respiratory failure with hypoxia: Secondary | ICD-10-CM | POA: Diagnosis not present

## 2017-11-26 DIAGNOSIS — I272 Pulmonary hypertension, unspecified: Secondary | ICD-10-CM | POA: Diagnosis not present

## 2017-11-26 DIAGNOSIS — J449 Chronic obstructive pulmonary disease, unspecified: Secondary | ICD-10-CM | POA: Diagnosis not present

## 2017-11-27 DIAGNOSIS — J9610 Chronic respiratory failure, unspecified whether with hypoxia or hypercapnia: Secondary | ICD-10-CM | POA: Diagnosis not present

## 2017-11-27 DIAGNOSIS — I270 Primary pulmonary hypertension: Secondary | ICD-10-CM | POA: Diagnosis not present

## 2017-11-27 DIAGNOSIS — Z139 Encounter for screening, unspecified: Secondary | ICD-10-CM | POA: Diagnosis not present

## 2017-11-28 DIAGNOSIS — R0602 Shortness of breath: Secondary | ICD-10-CM | POA: Diagnosis not present

## 2017-11-28 DIAGNOSIS — G4733 Obstructive sleep apnea (adult) (pediatric): Secondary | ICD-10-CM | POA: Diagnosis not present

## 2017-11-28 DIAGNOSIS — I2721 Secondary pulmonary arterial hypertension: Secondary | ICD-10-CM | POA: Diagnosis not present

## 2017-11-28 DIAGNOSIS — J449 Chronic obstructive pulmonary disease, unspecified: Secondary | ICD-10-CM | POA: Diagnosis not present

## 2017-11-28 DIAGNOSIS — Z9989 Dependence on other enabling machines and devices: Secondary | ICD-10-CM | POA: Diagnosis not present

## 2017-11-28 DIAGNOSIS — I272 Pulmonary hypertension, unspecified: Secondary | ICD-10-CM | POA: Diagnosis not present

## 2017-11-29 DIAGNOSIS — Z9181 History of falling: Secondary | ICD-10-CM | POA: Diagnosis not present

## 2017-11-29 DIAGNOSIS — I272 Pulmonary hypertension, unspecified: Secondary | ICD-10-CM | POA: Diagnosis not present

## 2017-11-29 DIAGNOSIS — I50810 Right heart failure, unspecified: Secondary | ICD-10-CM | POA: Diagnosis not present

## 2017-11-29 DIAGNOSIS — Z7982 Long term (current) use of aspirin: Secondary | ICD-10-CM | POA: Diagnosis not present

## 2017-11-29 DIAGNOSIS — J449 Chronic obstructive pulmonary disease, unspecified: Secondary | ICD-10-CM | POA: Diagnosis not present

## 2017-11-29 DIAGNOSIS — J9611 Chronic respiratory failure with hypoxia: Secondary | ICD-10-CM | POA: Diagnosis not present

## 2017-11-29 DIAGNOSIS — J9612 Chronic respiratory failure with hypercapnia: Secondary | ICD-10-CM | POA: Diagnosis not present

## 2017-11-29 DIAGNOSIS — I110 Hypertensive heart disease with heart failure: Secondary | ICD-10-CM | POA: Diagnosis not present

## 2017-12-03 DIAGNOSIS — J449 Chronic obstructive pulmonary disease, unspecified: Secondary | ICD-10-CM | POA: Diagnosis not present

## 2017-12-03 DIAGNOSIS — I50810 Right heart failure, unspecified: Secondary | ICD-10-CM | POA: Diagnosis not present

## 2017-12-03 DIAGNOSIS — I272 Pulmonary hypertension, unspecified: Secondary | ICD-10-CM | POA: Diagnosis not present

## 2017-12-03 DIAGNOSIS — Z7982 Long term (current) use of aspirin: Secondary | ICD-10-CM | POA: Diagnosis not present

## 2017-12-03 DIAGNOSIS — Z9181 History of falling: Secondary | ICD-10-CM | POA: Diagnosis not present

## 2017-12-03 DIAGNOSIS — I110 Hypertensive heart disease with heart failure: Secondary | ICD-10-CM | POA: Diagnosis not present

## 2017-12-03 DIAGNOSIS — J9612 Chronic respiratory failure with hypercapnia: Secondary | ICD-10-CM | POA: Diagnosis not present

## 2017-12-03 DIAGNOSIS — J9611 Chronic respiratory failure with hypoxia: Secondary | ICD-10-CM | POA: Diagnosis not present

## 2017-12-05 DIAGNOSIS — J9611 Chronic respiratory failure with hypoxia: Secondary | ICD-10-CM | POA: Diagnosis not present

## 2017-12-05 DIAGNOSIS — I272 Pulmonary hypertension, unspecified: Secondary | ICD-10-CM | POA: Diagnosis not present

## 2017-12-05 DIAGNOSIS — I50810 Right heart failure, unspecified: Secondary | ICD-10-CM | POA: Diagnosis not present

## 2017-12-05 DIAGNOSIS — J449 Chronic obstructive pulmonary disease, unspecified: Secondary | ICD-10-CM | POA: Diagnosis not present

## 2017-12-05 DIAGNOSIS — Z7982 Long term (current) use of aspirin: Secondary | ICD-10-CM | POA: Diagnosis not present

## 2017-12-05 DIAGNOSIS — I110 Hypertensive heart disease with heart failure: Secondary | ICD-10-CM | POA: Diagnosis not present

## 2017-12-05 DIAGNOSIS — G4733 Obstructive sleep apnea (adult) (pediatric): Secondary | ICD-10-CM | POA: Diagnosis not present

## 2017-12-05 DIAGNOSIS — Z9181 History of falling: Secondary | ICD-10-CM | POA: Diagnosis not present

## 2017-12-05 DIAGNOSIS — J9612 Chronic respiratory failure with hypercapnia: Secondary | ICD-10-CM | POA: Diagnosis not present

## 2017-12-10 DIAGNOSIS — Z7982 Long term (current) use of aspirin: Secondary | ICD-10-CM | POA: Diagnosis not present

## 2017-12-10 DIAGNOSIS — I272 Pulmonary hypertension, unspecified: Secondary | ICD-10-CM | POA: Diagnosis not present

## 2017-12-10 DIAGNOSIS — Z9181 History of falling: Secondary | ICD-10-CM | POA: Diagnosis not present

## 2017-12-10 DIAGNOSIS — J9611 Chronic respiratory failure with hypoxia: Secondary | ICD-10-CM | POA: Diagnosis not present

## 2017-12-10 DIAGNOSIS — I110 Hypertensive heart disease with heart failure: Secondary | ICD-10-CM | POA: Diagnosis not present

## 2017-12-10 DIAGNOSIS — I50810 Right heart failure, unspecified: Secondary | ICD-10-CM | POA: Diagnosis not present

## 2017-12-10 DIAGNOSIS — J9612 Chronic respiratory failure with hypercapnia: Secondary | ICD-10-CM | POA: Diagnosis not present

## 2017-12-10 DIAGNOSIS — J449 Chronic obstructive pulmonary disease, unspecified: Secondary | ICD-10-CM | POA: Diagnosis not present

## 2017-12-12 DIAGNOSIS — I110 Hypertensive heart disease with heart failure: Secondary | ICD-10-CM | POA: Diagnosis not present

## 2017-12-12 DIAGNOSIS — J9612 Chronic respiratory failure with hypercapnia: Secondary | ICD-10-CM | POA: Diagnosis not present

## 2017-12-12 DIAGNOSIS — Z9181 History of falling: Secondary | ICD-10-CM | POA: Diagnosis not present

## 2017-12-12 DIAGNOSIS — I272 Pulmonary hypertension, unspecified: Secondary | ICD-10-CM | POA: Diagnosis not present

## 2017-12-12 DIAGNOSIS — I50810 Right heart failure, unspecified: Secondary | ICD-10-CM | POA: Diagnosis not present

## 2017-12-12 DIAGNOSIS — J9611 Chronic respiratory failure with hypoxia: Secondary | ICD-10-CM | POA: Diagnosis not present

## 2017-12-12 DIAGNOSIS — Z7982 Long term (current) use of aspirin: Secondary | ICD-10-CM | POA: Diagnosis not present

## 2017-12-12 DIAGNOSIS — J449 Chronic obstructive pulmonary disease, unspecified: Secondary | ICD-10-CM | POA: Diagnosis not present

## 2017-12-16 DIAGNOSIS — J449 Chronic obstructive pulmonary disease, unspecified: Secondary | ICD-10-CM | POA: Diagnosis not present

## 2017-12-17 DIAGNOSIS — I50810 Right heart failure, unspecified: Secondary | ICD-10-CM | POA: Diagnosis not present

## 2017-12-17 DIAGNOSIS — J9612 Chronic respiratory failure with hypercapnia: Secondary | ICD-10-CM | POA: Diagnosis not present

## 2017-12-17 DIAGNOSIS — J449 Chronic obstructive pulmonary disease, unspecified: Secondary | ICD-10-CM | POA: Diagnosis not present

## 2017-12-17 DIAGNOSIS — Z9181 History of falling: Secondary | ICD-10-CM | POA: Diagnosis not present

## 2017-12-17 DIAGNOSIS — J9611 Chronic respiratory failure with hypoxia: Secondary | ICD-10-CM | POA: Diagnosis not present

## 2017-12-17 DIAGNOSIS — I272 Pulmonary hypertension, unspecified: Secondary | ICD-10-CM | POA: Diagnosis not present

## 2017-12-17 DIAGNOSIS — I110 Hypertensive heart disease with heart failure: Secondary | ICD-10-CM | POA: Diagnosis not present

## 2017-12-17 DIAGNOSIS — Z7982 Long term (current) use of aspirin: Secondary | ICD-10-CM | POA: Diagnosis not present

## 2017-12-20 DIAGNOSIS — J449 Chronic obstructive pulmonary disease, unspecified: Secondary | ICD-10-CM | POA: Diagnosis not present

## 2017-12-22 DIAGNOSIS — I270 Primary pulmonary hypertension: Secondary | ICD-10-CM | POA: Diagnosis not present

## 2017-12-27 DIAGNOSIS — M8589 Other specified disorders of bone density and structure, multiple sites: Secondary | ICD-10-CM | POA: Diagnosis not present

## 2017-12-27 DIAGNOSIS — Z1231 Encounter for screening mammogram for malignant neoplasm of breast: Secondary | ICD-10-CM | POA: Diagnosis not present

## 2018-01-05 DIAGNOSIS — J449 Chronic obstructive pulmonary disease, unspecified: Secondary | ICD-10-CM | POA: Diagnosis not present

## 2018-01-05 DIAGNOSIS — G4733 Obstructive sleep apnea (adult) (pediatric): Secondary | ICD-10-CM | POA: Diagnosis not present

## 2018-01-06 DIAGNOSIS — R0902 Hypoxemia: Secondary | ICD-10-CM | POA: Diagnosis not present

## 2018-01-06 DIAGNOSIS — G4733 Obstructive sleep apnea (adult) (pediatric): Secondary | ICD-10-CM | POA: Diagnosis not present

## 2018-01-09 DIAGNOSIS — M8580 Other specified disorders of bone density and structure, unspecified site: Secondary | ICD-10-CM | POA: Diagnosis not present

## 2018-01-15 DIAGNOSIS — J449 Chronic obstructive pulmonary disease, unspecified: Secondary | ICD-10-CM | POA: Diagnosis not present

## 2018-01-19 DIAGNOSIS — I270 Primary pulmonary hypertension: Secondary | ICD-10-CM | POA: Diagnosis not present

## 2018-01-19 DIAGNOSIS — J449 Chronic obstructive pulmonary disease, unspecified: Secondary | ICD-10-CM | POA: Diagnosis not present

## 2018-01-27 DIAGNOSIS — G4733 Obstructive sleep apnea (adult) (pediatric): Secondary | ICD-10-CM | POA: Diagnosis not present

## 2018-02-04 DIAGNOSIS — G4733 Obstructive sleep apnea (adult) (pediatric): Secondary | ICD-10-CM | POA: Diagnosis not present

## 2018-02-04 DIAGNOSIS — J449 Chronic obstructive pulmonary disease, unspecified: Secondary | ICD-10-CM | POA: Diagnosis not present

## 2018-02-15 DIAGNOSIS — J449 Chronic obstructive pulmonary disease, unspecified: Secondary | ICD-10-CM | POA: Diagnosis not present

## 2018-02-16 DIAGNOSIS — I270 Primary pulmonary hypertension: Secondary | ICD-10-CM | POA: Diagnosis not present

## 2018-02-19 DIAGNOSIS — J449 Chronic obstructive pulmonary disease, unspecified: Secondary | ICD-10-CM | POA: Diagnosis not present

## 2018-02-26 DIAGNOSIS — J449 Chronic obstructive pulmonary disease, unspecified: Secondary | ICD-10-CM | POA: Diagnosis not present

## 2018-02-26 DIAGNOSIS — I270 Primary pulmonary hypertension: Secondary | ICD-10-CM | POA: Diagnosis not present

## 2018-02-26 DIAGNOSIS — Z9981 Dependence on supplemental oxygen: Secondary | ICD-10-CM | POA: Diagnosis not present

## 2018-02-26 DIAGNOSIS — J9610 Chronic respiratory failure, unspecified whether with hypoxia or hypercapnia: Secondary | ICD-10-CM | POA: Diagnosis not present

## 2018-02-28 DIAGNOSIS — Z87891 Personal history of nicotine dependence: Secondary | ICD-10-CM | POA: Diagnosis not present

## 2018-02-28 DIAGNOSIS — R0902 Hypoxemia: Secondary | ICD-10-CM | POA: Diagnosis not present

## 2018-02-28 DIAGNOSIS — Z7951 Long term (current) use of inhaled steroids: Secondary | ICD-10-CM | POA: Diagnosis not present

## 2018-02-28 DIAGNOSIS — I471 Supraventricular tachycardia: Secondary | ICD-10-CM | POA: Diagnosis not present

## 2018-02-28 DIAGNOSIS — R0602 Shortness of breath: Secondary | ICD-10-CM | POA: Diagnosis not present

## 2018-02-28 DIAGNOSIS — G4733 Obstructive sleep apnea (adult) (pediatric): Secondary | ICD-10-CM | POA: Diagnosis not present

## 2018-02-28 DIAGNOSIS — J449 Chronic obstructive pulmonary disease, unspecified: Secondary | ICD-10-CM | POA: Diagnosis not present

## 2018-02-28 DIAGNOSIS — I2721 Secondary pulmonary arterial hypertension: Secondary | ICD-10-CM | POA: Diagnosis not present

## 2018-02-28 DIAGNOSIS — R600 Localized edema: Secondary | ICD-10-CM | POA: Diagnosis not present

## 2018-02-28 DIAGNOSIS — I10 Essential (primary) hypertension: Secondary | ICD-10-CM | POA: Diagnosis not present

## 2018-02-28 DIAGNOSIS — Z7982 Long term (current) use of aspirin: Secondary | ICD-10-CM | POA: Diagnosis not present

## 2018-02-28 DIAGNOSIS — M25559 Pain in unspecified hip: Secondary | ICD-10-CM | POA: Diagnosis not present

## 2018-03-16 DIAGNOSIS — I270 Primary pulmonary hypertension: Secondary | ICD-10-CM | POA: Diagnosis not present

## 2018-03-18 DIAGNOSIS — J449 Chronic obstructive pulmonary disease, unspecified: Secondary | ICD-10-CM | POA: Diagnosis not present

## 2018-03-22 DIAGNOSIS — J449 Chronic obstructive pulmonary disease, unspecified: Secondary | ICD-10-CM | POA: Diagnosis not present

## 2018-03-24 DIAGNOSIS — I270 Primary pulmonary hypertension: Secondary | ICD-10-CM | POA: Diagnosis not present

## 2018-03-24 DIAGNOSIS — E782 Mixed hyperlipidemia: Secondary | ICD-10-CM | POA: Diagnosis not present

## 2018-03-31 DIAGNOSIS — I50810 Right heart failure, unspecified: Secondary | ICD-10-CM | POA: Diagnosis not present

## 2018-03-31 DIAGNOSIS — Z23 Encounter for immunization: Secondary | ICD-10-CM | POA: Diagnosis not present

## 2018-03-31 DIAGNOSIS — J449 Chronic obstructive pulmonary disease, unspecified: Secondary | ICD-10-CM | POA: Diagnosis not present

## 2018-03-31 DIAGNOSIS — I270 Primary pulmonary hypertension: Secondary | ICD-10-CM | POA: Diagnosis not present

## 2018-03-31 DIAGNOSIS — E782 Mixed hyperlipidemia: Secondary | ICD-10-CM | POA: Diagnosis not present

## 2018-04-13 DIAGNOSIS — I270 Primary pulmonary hypertension: Secondary | ICD-10-CM | POA: Diagnosis not present

## 2018-04-17 DIAGNOSIS — J449 Chronic obstructive pulmonary disease, unspecified: Secondary | ICD-10-CM | POA: Diagnosis not present

## 2018-04-21 DIAGNOSIS — J449 Chronic obstructive pulmonary disease, unspecified: Secondary | ICD-10-CM | POA: Diagnosis not present

## 2018-05-11 DIAGNOSIS — I270 Primary pulmonary hypertension: Secondary | ICD-10-CM | POA: Diagnosis not present

## 2018-05-22 DIAGNOSIS — J449 Chronic obstructive pulmonary disease, unspecified: Secondary | ICD-10-CM | POA: Diagnosis not present

## 2018-05-30 DIAGNOSIS — I2721 Secondary pulmonary arterial hypertension: Secondary | ICD-10-CM | POA: Diagnosis not present

## 2018-05-30 DIAGNOSIS — R0602 Shortness of breath: Secondary | ICD-10-CM | POA: Diagnosis not present

## 2018-05-30 DIAGNOSIS — G4733 Obstructive sleep apnea (adult) (pediatric): Secondary | ICD-10-CM | POA: Diagnosis not present

## 2018-05-30 DIAGNOSIS — J449 Chronic obstructive pulmonary disease, unspecified: Secondary | ICD-10-CM | POA: Diagnosis not present

## 2018-06-08 DIAGNOSIS — I270 Primary pulmonary hypertension: Secondary | ICD-10-CM | POA: Diagnosis not present

## 2018-06-21 DIAGNOSIS — J449 Chronic obstructive pulmonary disease, unspecified: Secondary | ICD-10-CM | POA: Diagnosis not present

## 2018-07-06 DIAGNOSIS — I270 Primary pulmonary hypertension: Secondary | ICD-10-CM | POA: Diagnosis not present

## 2018-07-22 DIAGNOSIS — J449 Chronic obstructive pulmonary disease, unspecified: Secondary | ICD-10-CM | POA: Diagnosis not present

## 2018-08-03 DIAGNOSIS — I270 Primary pulmonary hypertension: Secondary | ICD-10-CM | POA: Diagnosis not present

## 2018-08-22 DIAGNOSIS — J449 Chronic obstructive pulmonary disease, unspecified: Secondary | ICD-10-CM | POA: Diagnosis not present

## 2018-08-25 DIAGNOSIS — E782 Mixed hyperlipidemia: Secondary | ICD-10-CM | POA: Diagnosis not present

## 2018-08-25 DIAGNOSIS — I270 Primary pulmonary hypertension: Secondary | ICD-10-CM | POA: Diagnosis not present

## 2018-08-31 DIAGNOSIS — I270 Primary pulmonary hypertension: Secondary | ICD-10-CM | POA: Diagnosis not present

## 2018-09-01 DIAGNOSIS — Z139 Encounter for screening, unspecified: Secondary | ICD-10-CM | POA: Diagnosis not present

## 2018-09-01 DIAGNOSIS — I270 Primary pulmonary hypertension: Secondary | ICD-10-CM | POA: Diagnosis not present

## 2018-09-01 DIAGNOSIS — E782 Mixed hyperlipidemia: Secondary | ICD-10-CM | POA: Diagnosis not present

## 2018-09-01 DIAGNOSIS — J449 Chronic obstructive pulmonary disease, unspecified: Secondary | ICD-10-CM | POA: Diagnosis not present

## 2018-09-01 DIAGNOSIS — J9610 Chronic respiratory failure, unspecified whether with hypoxia or hypercapnia: Secondary | ICD-10-CM | POA: Diagnosis not present

## 2018-09-12 DIAGNOSIS — G4733 Obstructive sleep apnea (adult) (pediatric): Secondary | ICD-10-CM | POA: Diagnosis not present

## 2018-09-16 DIAGNOSIS — H52223 Regular astigmatism, bilateral: Secondary | ICD-10-CM | POA: Diagnosis not present

## 2018-09-16 DIAGNOSIS — H43399 Other vitreous opacities, unspecified eye: Secondary | ICD-10-CM | POA: Diagnosis not present

## 2018-09-20 DIAGNOSIS — J449 Chronic obstructive pulmonary disease, unspecified: Secondary | ICD-10-CM | POA: Diagnosis not present

## 2018-09-28 DIAGNOSIS — I270 Primary pulmonary hypertension: Secondary | ICD-10-CM | POA: Diagnosis not present

## 2018-10-21 DIAGNOSIS — J449 Chronic obstructive pulmonary disease, unspecified: Secondary | ICD-10-CM | POA: Diagnosis not present

## 2018-10-21 DIAGNOSIS — M79669 Pain in unspecified lower leg: Secondary | ICD-10-CM | POA: Diagnosis not present

## 2018-10-23 DIAGNOSIS — Z139 Encounter for screening, unspecified: Secondary | ICD-10-CM | POA: Diagnosis not present

## 2018-10-23 DIAGNOSIS — Z0000 Encounter for general adult medical examination without abnormal findings: Secondary | ICD-10-CM | POA: Diagnosis not present

## 2018-10-26 DIAGNOSIS — I270 Primary pulmonary hypertension: Secondary | ICD-10-CM | POA: Diagnosis not present

## 2018-10-31 DIAGNOSIS — I2721 Secondary pulmonary arterial hypertension: Secondary | ICD-10-CM | POA: Diagnosis not present

## 2018-11-20 DIAGNOSIS — J449 Chronic obstructive pulmonary disease, unspecified: Secondary | ICD-10-CM | POA: Diagnosis not present

## 2018-11-21 DIAGNOSIS — G629 Polyneuropathy, unspecified: Secondary | ICD-10-CM | POA: Diagnosis not present

## 2018-11-21 DIAGNOSIS — Z7189 Other specified counseling: Secondary | ICD-10-CM | POA: Diagnosis not present

## 2018-11-21 DIAGNOSIS — Z139 Encounter for screening, unspecified: Secondary | ICD-10-CM | POA: Diagnosis not present

## 2018-11-23 DIAGNOSIS — I270 Primary pulmonary hypertension: Secondary | ICD-10-CM | POA: Diagnosis not present

## 2018-11-27 DIAGNOSIS — E782 Mixed hyperlipidemia: Secondary | ICD-10-CM | POA: Diagnosis not present

## 2018-11-27 DIAGNOSIS — I270 Primary pulmonary hypertension: Secondary | ICD-10-CM | POA: Diagnosis not present

## 2018-12-02 DIAGNOSIS — J449 Chronic obstructive pulmonary disease, unspecified: Secondary | ICD-10-CM | POA: Diagnosis not present

## 2018-12-02 DIAGNOSIS — G4733 Obstructive sleep apnea (adult) (pediatric): Secondary | ICD-10-CM | POA: Diagnosis not present

## 2018-12-02 DIAGNOSIS — I2721 Secondary pulmonary arterial hypertension: Secondary | ICD-10-CM | POA: Diagnosis not present

## 2018-12-02 DIAGNOSIS — R0602 Shortness of breath: Secondary | ICD-10-CM | POA: Diagnosis not present

## 2018-12-04 DIAGNOSIS — J9610 Chronic respiratory failure, unspecified whether with hypoxia or hypercapnia: Secondary | ICD-10-CM | POA: Diagnosis not present

## 2018-12-04 DIAGNOSIS — E782 Mixed hyperlipidemia: Secondary | ICD-10-CM | POA: Diagnosis not present

## 2018-12-04 DIAGNOSIS — I270 Primary pulmonary hypertension: Secondary | ICD-10-CM | POA: Diagnosis not present

## 2018-12-05 DIAGNOSIS — R0602 Shortness of breath: Secondary | ICD-10-CM | POA: Diagnosis not present

## 2018-12-05 DIAGNOSIS — I2721 Secondary pulmonary arterial hypertension: Secondary | ICD-10-CM | POA: Diagnosis not present

## 2018-12-05 DIAGNOSIS — J449 Chronic obstructive pulmonary disease, unspecified: Secondary | ICD-10-CM | POA: Diagnosis not present

## 2018-12-05 DIAGNOSIS — G4733 Obstructive sleep apnea (adult) (pediatric): Secondary | ICD-10-CM | POA: Diagnosis not present

## 2018-12-21 DIAGNOSIS — I270 Primary pulmonary hypertension: Secondary | ICD-10-CM | POA: Diagnosis not present

## 2018-12-21 DIAGNOSIS — J449 Chronic obstructive pulmonary disease, unspecified: Secondary | ICD-10-CM | POA: Diagnosis not present

## 2018-12-26 DIAGNOSIS — I358 Other nonrheumatic aortic valve disorders: Secondary | ICD-10-CM | POA: Diagnosis not present

## 2018-12-26 DIAGNOSIS — I378 Other nonrheumatic pulmonary valve disorders: Secondary | ICD-10-CM | POA: Diagnosis not present

## 2018-12-26 DIAGNOSIS — G4733 Obstructive sleep apnea (adult) (pediatric): Secondary | ICD-10-CM | POA: Diagnosis not present

## 2018-12-26 DIAGNOSIS — I071 Rheumatic tricuspid insufficiency: Secondary | ICD-10-CM | POA: Diagnosis not present

## 2018-12-26 DIAGNOSIS — I371 Nonrheumatic pulmonary valve insufficiency: Secondary | ICD-10-CM | POA: Diagnosis not present

## 2018-12-26 DIAGNOSIS — I2721 Secondary pulmonary arterial hypertension: Secondary | ICD-10-CM | POA: Diagnosis not present

## 2018-12-26 DIAGNOSIS — J449 Chronic obstructive pulmonary disease, unspecified: Secondary | ICD-10-CM | POA: Diagnosis not present

## 2018-12-26 DIAGNOSIS — R0602 Shortness of breath: Secondary | ICD-10-CM | POA: Diagnosis not present

## 2018-12-26 DIAGNOSIS — I361 Nonrheumatic tricuspid (valve) insufficiency: Secondary | ICD-10-CM | POA: Diagnosis not present

## 2019-01-18 DIAGNOSIS — I270 Primary pulmonary hypertension: Secondary | ICD-10-CM | POA: Diagnosis not present

## 2019-01-20 DIAGNOSIS — J449 Chronic obstructive pulmonary disease, unspecified: Secondary | ICD-10-CM | POA: Diagnosis not present

## 2019-01-24 DIAGNOSIS — G250 Essential tremor: Secondary | ICD-10-CM | POA: Diagnosis not present

## 2019-01-24 DIAGNOSIS — J9811 Atelectasis: Secondary | ICD-10-CM | POA: Diagnosis not present

## 2019-01-24 DIAGNOSIS — I361 Nonrheumatic tricuspid (valve) insufficiency: Secondary | ICD-10-CM | POA: Diagnosis not present

## 2019-01-24 DIAGNOSIS — Z96641 Presence of right artificial hip joint: Secondary | ICD-10-CM | POA: Diagnosis not present

## 2019-01-24 DIAGNOSIS — I272 Pulmonary hypertension, unspecified: Secondary | ICD-10-CM | POA: Diagnosis not present

## 2019-01-24 DIAGNOSIS — R0602 Shortness of breath: Secondary | ICD-10-CM | POA: Diagnosis not present

## 2019-01-24 DIAGNOSIS — E785 Hyperlipidemia, unspecified: Secondary | ICD-10-CM | POA: Diagnosis not present

## 2019-01-24 DIAGNOSIS — G4733 Obstructive sleep apnea (adult) (pediatric): Secondary | ICD-10-CM | POA: Diagnosis not present

## 2019-01-24 DIAGNOSIS — R918 Other nonspecific abnormal finding of lung field: Secondary | ICD-10-CM | POA: Diagnosis not present

## 2019-01-24 DIAGNOSIS — J810 Acute pulmonary edema: Secondary | ICD-10-CM | POA: Diagnosis not present

## 2019-01-24 DIAGNOSIS — J441 Chronic obstructive pulmonary disease with (acute) exacerbation: Secondary | ICD-10-CM | POA: Diagnosis not present

## 2019-01-24 DIAGNOSIS — G629 Polyneuropathy, unspecified: Secondary | ICD-10-CM | POA: Diagnosis not present

## 2019-01-24 DIAGNOSIS — I471 Supraventricular tachycardia: Secondary | ICD-10-CM | POA: Diagnosis not present

## 2019-01-24 DIAGNOSIS — J9622 Acute and chronic respiratory failure with hypercapnia: Secondary | ICD-10-CM | POA: Diagnosis not present

## 2019-01-24 DIAGNOSIS — I270 Primary pulmonary hypertension: Secondary | ICD-10-CM | POA: Diagnosis not present

## 2019-01-24 DIAGNOSIS — Z7982 Long term (current) use of aspirin: Secondary | ICD-10-CM | POA: Diagnosis not present

## 2019-01-24 DIAGNOSIS — R05 Cough: Secondary | ICD-10-CM | POA: Diagnosis not present

## 2019-01-24 DIAGNOSIS — N179 Acute kidney failure, unspecified: Secondary | ICD-10-CM | POA: Diagnosis not present

## 2019-01-24 DIAGNOSIS — I5189 Other ill-defined heart diseases: Secondary | ICD-10-CM | POA: Diagnosis not present

## 2019-01-24 DIAGNOSIS — E875 Hyperkalemia: Secondary | ICD-10-CM | POA: Diagnosis not present

## 2019-01-24 DIAGNOSIS — J811 Chronic pulmonary edema: Secondary | ICD-10-CM | POA: Diagnosis not present

## 2019-01-24 DIAGNOSIS — E8770 Fluid overload, unspecified: Secondary | ICD-10-CM | POA: Diagnosis not present

## 2019-01-24 DIAGNOSIS — K219 Gastro-esophageal reflux disease without esophagitis: Secondary | ICD-10-CM | POA: Diagnosis not present

## 2019-01-24 DIAGNOSIS — Z7951 Long term (current) use of inhaled steroids: Secondary | ICD-10-CM | POA: Diagnosis not present

## 2019-01-24 DIAGNOSIS — Z87891 Personal history of nicotine dependence: Secondary | ICD-10-CM | POA: Diagnosis not present

## 2019-01-24 DIAGNOSIS — J9602 Acute respiratory failure with hypercapnia: Secondary | ICD-10-CM | POA: Diagnosis not present

## 2019-01-26 DIAGNOSIS — I272 Pulmonary hypertension, unspecified: Secondary | ICD-10-CM | POA: Diagnosis not present

## 2019-01-26 DIAGNOSIS — N179 Acute kidney failure, unspecified: Secondary | ICD-10-CM | POA: Diagnosis not present

## 2019-01-26 DIAGNOSIS — J9602 Acute respiratory failure with hypercapnia: Secondary | ICD-10-CM | POA: Diagnosis not present

## 2019-01-26 DIAGNOSIS — J441 Chronic obstructive pulmonary disease with (acute) exacerbation: Secondary | ICD-10-CM | POA: Diagnosis not present

## 2019-01-27 DIAGNOSIS — N179 Acute kidney failure, unspecified: Secondary | ICD-10-CM | POA: Diagnosis not present

## 2019-01-27 DIAGNOSIS — I272 Pulmonary hypertension, unspecified: Secondary | ICD-10-CM | POA: Diagnosis not present

## 2019-01-27 DIAGNOSIS — E875 Hyperkalemia: Secondary | ICD-10-CM | POA: Diagnosis not present

## 2019-01-27 DIAGNOSIS — J9622 Acute and chronic respiratory failure with hypercapnia: Secondary | ICD-10-CM | POA: Diagnosis not present

## 2019-01-27 DIAGNOSIS — J441 Chronic obstructive pulmonary disease with (acute) exacerbation: Secondary | ICD-10-CM | POA: Diagnosis not present

## 2019-01-28 DIAGNOSIS — I361 Nonrheumatic tricuspid (valve) insufficiency: Secondary | ICD-10-CM | POA: Diagnosis not present

## 2019-02-03 DIAGNOSIS — N183 Chronic kidney disease, stage 3 (moderate): Secondary | ICD-10-CM | POA: Diagnosis not present

## 2019-02-03 DIAGNOSIS — I50810 Right heart failure, unspecified: Secondary | ICD-10-CM | POA: Diagnosis not present

## 2019-02-03 DIAGNOSIS — I129 Hypertensive chronic kidney disease with stage 1 through stage 4 chronic kidney disease, or unspecified chronic kidney disease: Secondary | ICD-10-CM | POA: Diagnosis not present

## 2019-02-03 DIAGNOSIS — Z7689 Persons encountering health services in other specified circumstances: Secondary | ICD-10-CM | POA: Diagnosis not present

## 2019-02-10 DIAGNOSIS — I270 Primary pulmonary hypertension: Secondary | ICD-10-CM | POA: Diagnosis not present

## 2019-02-10 DIAGNOSIS — J9610 Chronic respiratory failure, unspecified whether with hypoxia or hypercapnia: Secondary | ICD-10-CM | POA: Diagnosis not present

## 2019-02-10 DIAGNOSIS — I50812 Chronic right heart failure: Secondary | ICD-10-CM | POA: Diagnosis not present

## 2019-02-10 DIAGNOSIS — N183 Chronic kidney disease, stage 3 (moderate): Secondary | ICD-10-CM | POA: Diagnosis not present

## 2019-02-15 DIAGNOSIS — I270 Primary pulmonary hypertension: Secondary | ICD-10-CM | POA: Diagnosis not present

## 2019-02-20 DIAGNOSIS — J449 Chronic obstructive pulmonary disease, unspecified: Secondary | ICD-10-CM | POA: Diagnosis not present

## 2019-02-24 DIAGNOSIS — I2721 Secondary pulmonary arterial hypertension: Secondary | ICD-10-CM | POA: Diagnosis not present

## 2019-02-24 DIAGNOSIS — J449 Chronic obstructive pulmonary disease, unspecified: Secondary | ICD-10-CM | POA: Diagnosis not present

## 2019-02-27 DIAGNOSIS — E782 Mixed hyperlipidemia: Secondary | ICD-10-CM | POA: Diagnosis not present

## 2019-02-27 DIAGNOSIS — I270 Primary pulmonary hypertension: Secondary | ICD-10-CM | POA: Diagnosis not present

## 2019-03-05 DIAGNOSIS — G4733 Obstructive sleep apnea (adult) (pediatric): Secondary | ICD-10-CM | POA: Diagnosis not present

## 2019-03-06 DIAGNOSIS — J449 Chronic obstructive pulmonary disease, unspecified: Secondary | ICD-10-CM | POA: Diagnosis not present

## 2019-03-06 DIAGNOSIS — I270 Primary pulmonary hypertension: Secondary | ICD-10-CM | POA: Diagnosis not present

## 2019-03-06 DIAGNOSIS — Z7189 Other specified counseling: Secondary | ICD-10-CM | POA: Diagnosis not present

## 2019-03-15 DIAGNOSIS — I270 Primary pulmonary hypertension: Secondary | ICD-10-CM | POA: Diagnosis not present

## 2019-03-23 DIAGNOSIS — J449 Chronic obstructive pulmonary disease, unspecified: Secondary | ICD-10-CM | POA: Diagnosis not present

## 2019-03-27 ENCOUNTER — Telehealth: Payer: Self-pay | Admitting: Adult Health Nurse Practitioner

## 2019-03-27 NOTE — Telephone Encounter (Signed)
Spoke with patient regarding Palliative services and she was in agreement with this.  I have scheduled a Telephone Consult for 04/02/19 @ 1 PM

## 2019-04-02 ENCOUNTER — Other Ambulatory Visit: Payer: Self-pay

## 2019-04-02 ENCOUNTER — Other Ambulatory Visit: Payer: Medicare Other | Admitting: Adult Health Nurse Practitioner

## 2019-04-02 DIAGNOSIS — Z515 Encounter for palliative care: Secondary | ICD-10-CM

## 2019-04-02 NOTE — Progress Notes (Signed)
[facility] Consult Note Telephone: [phone]  Fax: [phone]  PATIENT NAME: Kristen Montgomery DOB: [birth year] MRN: [id number]  PRIMARY CARE PROVIDER:   No primary care provider on file.  REFERRING PROVIDER:  Marco Collie, MD [place],  [place] [id number]  RESPONSIBLE PARTY:   Self [phone] Olam Idler Saki Legore [phone]  Due to the COVID-19 crisis, this visit was done via telemedicine and it was initiated and consent by this patient and or family. Video-audio (telehealth) contact was unable to be done due to technical barriers from the patient's side.    RECOMMENDATIONS and PLAN:  1.  Advanced care planning.  States that she has already filled out paperwork for this.  She states that she wants everything done that can be done to prolong life as long as she is able to get off of it.  States that if it looks like she is going not going to be able to survive without the machines or if she is brain dead she does not want to stay on life prolonging machines.  Does want a feeding tube for a trial period.  Have appointment in one month in person and will evaluate readiness to discuss further and introduce the MOST form if she does not have one in the home.  2.  Pulmonary HTN/COPD.  Patient is O2 dependent and at rest uses 2LPM.  States that at night she uses 4LPM along with her BiPAP machine.  She will have it as high as 4 1/2 LPM with activity.  Denies any increased SOB but does has DOE.  Is able to walk independently for short distances.  Use a walker for longer distances.  Uses a wheelchair when leaving the home.    Does require assistance with some ADLs such as bathing. States that 2 months ago started having desaturation at night when she lays down. Does have a hospital bed and sleeps with it elevated. States that her O2 sats are fine when she is sitting on edge of bed.  Have instructed that she may need to raise the head  of the bed more while lying down.  She is being followed by pulmonology.  Continue current plan of care recommended by pulmonology  3. CHF.  Was in hospital in July this year of fluid overload.  States that she has not had any increased edema since her hospitalization.  Is currently on 40 mg lasix daily.  Denies any cough, increased SOB, chest pain.  Continue current dosage of lasix and encouraged to weigh daily  4.  Support.  Patient has good family support.  Her daughter comes to give her a bath twice a week.  Her husband will go out to get them something to eat and help with meal prep.  Her sister comes once a week to help prepare meals.    Currently patient seems stable.  Do have in person visit set up in one month to evaluate for fluid overload and continue discussion of ACP.   I spent 30 minutes providing this consultation,  from 1:00 to 1:30. More than 50% of the time in this consultation was spent coordinating communication.   HISTORY OF PRESENT ILLNESS:  Kristen Montgomery is a 68 y.o. year old female with multiple medical problems including pulmonary HTN, COPD, CHF. Palliative Care was asked to help address goals of care.   CODE STATUS: Full Code  PPS: 60% HOSPICE ELIGIBILITY/DIAGNOSIS: TBD  PAST MEDICAL HISTORY: No  past medical history on file.  SOCIAL HX:  Social History   Tobacco Use  . Smoking status: Not on file  Substance Use Topics  . Alcohol use: Not on file    ALLERGIES: No Known Allergies   PERTINENT MEDICATIONS:  Outpatient Encounter Medications as of 04/02/2019  Medication Sig  . albuterol (ACCUNEB) 0.63 MG/3ML nebulizer solution Inhale 1 ampule by nebulization every six (6) hours as needed for wheezing.  Marland Kitchen albuterol (VENTOLIN HFA) 108 (90 Base) MCG/ACT inhaler INHALE 2 PUFFS BY MOUTH EVERY 4 TO 6 HOURS AS NEEDED FOR SHORTNESS OF BREATH, COUGH OR WHEEZE  . aspirin EC 81 MG tablet Take by mouth.  . carvedilol (COREG) 3.125 MG tablet Take 3.125 mg by mouth.  .  docusate sodium (COLACE) 100 MG capsule Take 100 mg by mouth.  . furosemide (LASIX) 40 MG tablet Take 40 mg by mouth.  . losartan (COZAAR) 50 MG tablet Take 50 mg by mouth 1 day or 1 dose.  Marland Kitchen omeprazole (PRILOSEC) 20 MG capsule Take 20 mg by mouth.  . potassium chloride SA (K-DUR,KLOR-CON) 20 MEQ tablet Take 10 mEq by mouth.  . tiotropium (SPIRIVA HANDIHALER) 18 MCG inhalation capsule INHALE 1 CAPSULE VIA HANDIHALER ONCE DAILY AT THE SAME TIME EVERY DAY  . traZODone (DESYREL) 50 MG tablet TAKE 1 TABLET BY MOUTH AT BEDTIME AS NEEDED  . Treprostinil (TYVASO) 0.6 MG/ML SOLN Inhale 3-9 breaths four times daily.   No facility-administered encounter medications on file as of 04/02/2019.       Yonna Alwin Jenetta Downer, NP

## 2019-04-12 DIAGNOSIS — I270 Primary pulmonary hypertension: Secondary | ICD-10-CM | POA: Diagnosis not present

## 2019-04-22 DIAGNOSIS — J449 Chronic obstructive pulmonary disease, unspecified: Secondary | ICD-10-CM | POA: Diagnosis not present

## 2019-05-05 ENCOUNTER — Telehealth: Payer: Self-pay | Admitting: Adult Health Nurse Practitioner

## 2019-05-05 NOTE — Telephone Encounter (Signed)
Called and changed in person visit on 05/07/2019 at 1pm to a telephone visit.  Patient in agreement Kristen Montgomery K. Olena Heckle NP

## 2019-05-07 ENCOUNTER — Other Ambulatory Visit: Payer: Self-pay

## 2019-05-07 ENCOUNTER — Other Ambulatory Visit: Payer: Medicare Other | Admitting: Adult Health Nurse Practitioner

## 2019-05-07 DIAGNOSIS — Z515 Encounter for palliative care: Secondary | ICD-10-CM

## 2019-05-07 NOTE — Progress Notes (Signed)
[facility] Consult Note Telephone: [phone]  Fax: [phone]  PATIENT NAME: Kristen Montgomery DOB: 08/03/1950 MRN: [id number]  PRIMARY CARE PROVIDER:   Marco Collie, MD  REFERRING PROVIDER:  No referring provider defined for this encounter.  RESPONSIBLE PARTY:   Self [phone] Husband, Lisaann Atha [phone]  Due to the COVID-19 crisis, this visit was done via telemedicine and it was initiated and consent by this patient and or family. Video-audio (telehealth) contact was unable to be done due to technical barriers from the patient's side.    RECOMMENDATIONS and PLAN:  1.  Advanced care planning. Patient is a full code.  No changes made today  2.  Pulmonary HTN/COPD.  Patient is O2 dependent and at rest uses 2LPM.  States that at night she uses 4LPM along with her BiPAP machine.  She will have it as high as 4 1/2 LPM with activity.  Denies any increased SOB but does has DOE.  Walks independently for short distances and uses walker for longer distances.  Requires some assistance with ADLs  Has a hospital bed but feels like she slides down to the middle of the bed when she sleeps.  Is wanting to find a recliner to see if that is more comfortable.  Have tried to encourage her to adjust the foot of the hospital to help with sliding down.  Continue follow up and recommendations by pulmonolgy  3.  Pain.  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  States that Tylenol helps with the pain when she takes it.  States that she has Voltaren get but has not tried this yet.  Have encouraged her to try this as it can help with the inflammation from her arthritis.  Have also encouraged using other OTC topical agents that can give her relief.    4.  CHF. Is currently on 40 mg lasix daily.  Denies any cough, increased SOB, chest pain.  Continue current dosage of lasix   Currently patient seems stable.  Patient  would like to be called back in 2-3 months to set up next appointment.  She is encouraged to call with any change, question, or concerns.   I spent 30 minutes providing this consultation,  from 1:00 to 1:30. More than 50% of the time in this consultation was spent coordinating communication.   HISTORY OF PRESENT ILLNESS:  [name] is a 68 y.o. year old female with multiple medical problems including pulmonary HTN, COPD, CHF. Palliative Care was asked to help address goals of care.   CODE STATUS: Full code  PPS: 60% HOSPICE ELIGIBILITY/DIAGNOSIS: TBD  PHYSICAL EXAM:   Deferred   PAST MEDICAL HISTORY: No past medical history on file.  SOCIAL HX:  Social History   Tobacco Use  . Smoking status: Not on file  Substance Use Topics  . Alcohol use: Not on file    ALLERGIES: No Known Allergies   PERTINENT MEDICATIONS:  Outpatient Encounter Medications as of 05/07/2019  Medication Sig  . albuterol (ACCUNEB) 0.63 MG/3ML nebulizer solution Inhale 1 ampule by nebulization every six (6) hours as needed for wheezing.  Marland Kitchen albuterol (VENTOLIN HFA) 108 (90 Base) MCG/ACT inhaler INHALE 2 PUFFS BY MOUTH EVERY 4 TO 6 HOURS AS NEEDED FOR SHORTNESS OF BREATH, COUGH OR WHEEZE  . aspirin EC 81 MG tablet Take by mouth.  . carvedilol (COREG) 3.125 MG tablet Take 3.125 mg by mouth.  . docusate  sodium (COLACE) 100 MG capsule Take 100 mg by mouth.  . furosemide (LASIX) 40 MG tablet Take 40 mg by mouth.  . losartan (COZAAR) 50 MG tablet Take 50 mg by mouth 1 day or 1 dose.  Marland Kitchen omeprazole (PRILOSEC) 20 MG capsule Take 20 mg by mouth.  . potassium chloride SA (K-DUR,KLOR-CON) 20 MEQ tablet Take 10 mEq by mouth.  . tiotropium (SPIRIVA HANDIHALER) 18 MCG inhalation capsule INHALE 1 CAPSULE VIA HANDIHALER ONCE DAILY AT THE SAME TIME EVERY DAY  . traZODone (DESYREL) 50 MG tablet TAKE 1 TABLET BY MOUTH AT BEDTIME AS NEEDED  . Treprostinil (TYVASO) 0.6 MG/ML SOLN Inhale 3-9 breaths four times daily.   No  facility-administered encounter medications on file as of 05/07/2019.       Darl Kuss Jenetta Downer, NP

## 2019-05-08 DIAGNOSIS — J449 Chronic obstructive pulmonary disease, unspecified: Secondary | ICD-10-CM | POA: Diagnosis not present

## 2019-05-10 DIAGNOSIS — I270 Primary pulmonary hypertension: Secondary | ICD-10-CM | POA: Diagnosis not present

## 2019-05-23 DIAGNOSIS — J449 Chronic obstructive pulmonary disease, unspecified: Secondary | ICD-10-CM | POA: Diagnosis not present

## 2019-06-02 DIAGNOSIS — G4733 Obstructive sleep apnea (adult) (pediatric): Secondary | ICD-10-CM | POA: Diagnosis not present

## 2019-06-07 DIAGNOSIS — I270 Primary pulmonary hypertension: Secondary | ICD-10-CM | POA: Diagnosis not present

## 2019-06-08 DIAGNOSIS — J449 Chronic obstructive pulmonary disease, unspecified: Secondary | ICD-10-CM | POA: Diagnosis not present

## 2019-06-22 DIAGNOSIS — J449 Chronic obstructive pulmonary disease, unspecified: Secondary | ICD-10-CM | POA: Diagnosis not present

## 2019-06-29 DIAGNOSIS — I10 Essential (primary) hypertension: Secondary | ICD-10-CM | POA: Diagnosis not present

## 2019-06-29 DIAGNOSIS — E782 Mixed hyperlipidemia: Secondary | ICD-10-CM | POA: Diagnosis not present

## 2019-07-05 DIAGNOSIS — I270 Primary pulmonary hypertension: Secondary | ICD-10-CM | POA: Diagnosis not present

## 2019-07-06 DIAGNOSIS — I50811 Acute right heart failure: Secondary | ICD-10-CM | POA: Diagnosis not present

## 2019-07-06 DIAGNOSIS — N183 Chronic kidney disease, stage 3 unspecified: Secondary | ICD-10-CM | POA: Diagnosis not present

## 2019-07-06 DIAGNOSIS — E782 Mixed hyperlipidemia: Secondary | ICD-10-CM | POA: Diagnosis not present

## 2019-07-06 DIAGNOSIS — I10 Essential (primary) hypertension: Secondary | ICD-10-CM | POA: Diagnosis not present

## 2019-07-08 DIAGNOSIS — J449 Chronic obstructive pulmonary disease, unspecified: Secondary | ICD-10-CM | POA: Diagnosis not present

## 2019-07-23 DIAGNOSIS — J449 Chronic obstructive pulmonary disease, unspecified: Secondary | ICD-10-CM | POA: Diagnosis not present

## 2019-08-02 DIAGNOSIS — I270 Primary pulmonary hypertension: Secondary | ICD-10-CM | POA: Diagnosis not present

## 2019-08-08 DIAGNOSIS — J449 Chronic obstructive pulmonary disease, unspecified: Secondary | ICD-10-CM | POA: Diagnosis not present

## 2019-08-18 DIAGNOSIS — Z7982 Long term (current) use of aspirin: Secondary | ICD-10-CM | POA: Diagnosis not present

## 2019-08-18 DIAGNOSIS — R0602 Shortness of breath: Secondary | ICD-10-CM | POA: Diagnosis not present

## 2019-08-18 DIAGNOSIS — Z20822 Contact with and (suspected) exposure to covid-19: Secondary | ICD-10-CM | POA: Diagnosis not present

## 2019-08-18 DIAGNOSIS — Z87891 Personal history of nicotine dependence: Secondary | ICD-10-CM | POA: Diagnosis not present

## 2019-08-18 DIAGNOSIS — J441 Chronic obstructive pulmonary disease with (acute) exacerbation: Secondary | ICD-10-CM | POA: Diagnosis not present

## 2019-08-18 DIAGNOSIS — Z9981 Dependence on supplemental oxygen: Secondary | ICD-10-CM | POA: Diagnosis not present

## 2019-08-18 DIAGNOSIS — J440 Chronic obstructive pulmonary disease with acute lower respiratory infection: Secondary | ICD-10-CM | POA: Diagnosis not present

## 2019-08-18 DIAGNOSIS — Z7952 Long term (current) use of systemic steroids: Secondary | ICD-10-CM | POA: Diagnosis not present

## 2019-08-23 DIAGNOSIS — J449 Chronic obstructive pulmonary disease, unspecified: Secondary | ICD-10-CM | POA: Diagnosis not present

## 2019-08-27 DIAGNOSIS — Z7689 Persons encountering health services in other specified circumstances: Secondary | ICD-10-CM | POA: Diagnosis not present

## 2019-08-27 DIAGNOSIS — I270 Primary pulmonary hypertension: Secondary | ICD-10-CM | POA: Diagnosis not present

## 2019-08-27 DIAGNOSIS — J9610 Chronic respiratory failure, unspecified whether with hypoxia or hypercapnia: Secondary | ICD-10-CM | POA: Diagnosis not present

## 2019-08-27 DIAGNOSIS — J449 Chronic obstructive pulmonary disease, unspecified: Secondary | ICD-10-CM | POA: Diagnosis not present

## 2019-08-30 DIAGNOSIS — I270 Primary pulmonary hypertension: Secondary | ICD-10-CM | POA: Diagnosis not present

## 2019-09-08 DIAGNOSIS — J449 Chronic obstructive pulmonary disease, unspecified: Secondary | ICD-10-CM | POA: Diagnosis not present

## 2019-09-12 DIAGNOSIS — Z87891 Personal history of nicotine dependence: Secondary | ICD-10-CM | POA: Diagnosis not present

## 2019-09-12 DIAGNOSIS — Z20822 Contact with and (suspected) exposure to covid-19: Secondary | ICD-10-CM | POA: Diagnosis not present

## 2019-09-12 DIAGNOSIS — I2723 Pulmonary hypertension due to lung diseases and hypoxia: Secondary | ICD-10-CM | POA: Diagnosis not present

## 2019-09-12 DIAGNOSIS — J441 Chronic obstructive pulmonary disease with (acute) exacerbation: Secondary | ICD-10-CM | POA: Diagnosis not present

## 2019-09-12 DIAGNOSIS — I10 Essential (primary) hypertension: Secondary | ICD-10-CM | POA: Diagnosis not present

## 2019-09-12 DIAGNOSIS — Z9981 Dependence on supplemental oxygen: Secondary | ICD-10-CM | POA: Diagnosis not present

## 2019-09-12 DIAGNOSIS — Z7951 Long term (current) use of inhaled steroids: Secondary | ICD-10-CM | POA: Diagnosis not present

## 2019-09-12 DIAGNOSIS — R0902 Hypoxemia: Secondary | ICD-10-CM | POA: Diagnosis not present

## 2019-09-12 DIAGNOSIS — I272 Pulmonary hypertension, unspecified: Secondary | ICD-10-CM | POA: Diagnosis not present

## 2019-09-12 DIAGNOSIS — I2721 Secondary pulmonary arterial hypertension: Secondary | ICD-10-CM | POA: Diagnosis not present

## 2019-09-12 DIAGNOSIS — G9341 Metabolic encephalopathy: Secondary | ICD-10-CM | POA: Diagnosis not present

## 2019-09-12 DIAGNOSIS — J439 Emphysema, unspecified: Secondary | ICD-10-CM | POA: Diagnosis not present

## 2019-09-12 DIAGNOSIS — R0600 Dyspnea, unspecified: Secondary | ICD-10-CM | POA: Diagnosis not present

## 2019-09-12 DIAGNOSIS — I5033 Acute on chronic diastolic (congestive) heart failure: Secondary | ICD-10-CM | POA: Diagnosis not present

## 2019-09-12 DIAGNOSIS — E8770 Fluid overload, unspecified: Secondary | ICD-10-CM | POA: Diagnosis not present

## 2019-09-12 DIAGNOSIS — I2722 Pulmonary hypertension due to left heart disease: Secondary | ICD-10-CM | POA: Diagnosis not present

## 2019-09-12 DIAGNOSIS — N179 Acute kidney failure, unspecified: Secondary | ICD-10-CM | POA: Diagnosis not present

## 2019-09-12 DIAGNOSIS — G4733 Obstructive sleep apnea (adult) (pediatric): Secondary | ICD-10-CM | POA: Diagnosis not present

## 2019-09-12 DIAGNOSIS — K219 Gastro-esophageal reflux disease without esophagitis: Secondary | ICD-10-CM | POA: Diagnosis not present

## 2019-09-12 DIAGNOSIS — J9622 Acute and chronic respiratory failure with hypercapnia: Secondary | ICD-10-CM | POA: Diagnosis not present

## 2019-09-12 DIAGNOSIS — J9601 Acute respiratory failure with hypoxia: Secondary | ICD-10-CM | POA: Diagnosis not present

## 2019-09-12 DIAGNOSIS — Z7982 Long term (current) use of aspirin: Secondary | ICD-10-CM | POA: Diagnosis not present

## 2019-09-12 DIAGNOSIS — Z96641 Presence of right artificial hip joint: Secondary | ICD-10-CM | POA: Diagnosis not present

## 2019-09-12 DIAGNOSIS — I110 Hypertensive heart disease with heart failure: Secondary | ICD-10-CM | POA: Diagnosis not present

## 2019-09-12 DIAGNOSIS — R0602 Shortness of breath: Secondary | ICD-10-CM | POA: Diagnosis not present

## 2019-09-12 DIAGNOSIS — J449 Chronic obstructive pulmonary disease, unspecified: Secondary | ICD-10-CM | POA: Diagnosis not present

## 2019-09-20 DIAGNOSIS — J449 Chronic obstructive pulmonary disease, unspecified: Secondary | ICD-10-CM | POA: Diagnosis not present

## 2019-09-24 DIAGNOSIS — Z7689 Persons encountering health services in other specified circumstances: Secondary | ICD-10-CM | POA: Diagnosis not present

## 2019-09-24 DIAGNOSIS — J9610 Chronic respiratory failure, unspecified whether with hypoxia or hypercapnia: Secondary | ICD-10-CM | POA: Diagnosis not present

## 2019-09-24 DIAGNOSIS — I50812 Chronic right heart failure: Secondary | ICD-10-CM | POA: Diagnosis not present

## 2019-09-24 DIAGNOSIS — I270 Primary pulmonary hypertension: Secondary | ICD-10-CM | POA: Diagnosis not present

## 2019-09-27 DIAGNOSIS — I270 Primary pulmonary hypertension: Secondary | ICD-10-CM | POA: Diagnosis not present

## 2019-09-28 DIAGNOSIS — E782 Mixed hyperlipidemia: Secondary | ICD-10-CM | POA: Diagnosis not present

## 2019-09-28 DIAGNOSIS — Z13220 Encounter for screening for lipoid disorders: Secondary | ICD-10-CM | POA: Diagnosis not present

## 2019-09-28 DIAGNOSIS — I50812 Chronic right heart failure: Secondary | ICD-10-CM | POA: Diagnosis not present

## 2019-09-28 DIAGNOSIS — Z136 Encounter for screening for cardiovascular disorders: Secondary | ICD-10-CM | POA: Diagnosis not present

## 2019-09-29 DIAGNOSIS — R000 Tachycardia, unspecified: Secondary | ICD-10-CM | POA: Diagnosis not present

## 2019-09-29 DIAGNOSIS — I4891 Unspecified atrial fibrillation: Secondary | ICD-10-CM | POA: Diagnosis not present

## 2019-09-30 DIAGNOSIS — G4733 Obstructive sleep apnea (adult) (pediatric): Secondary | ICD-10-CM | POA: Diagnosis not present

## 2019-10-06 DIAGNOSIS — J449 Chronic obstructive pulmonary disease, unspecified: Secondary | ICD-10-CM | POA: Diagnosis not present

## 2019-10-08 DIAGNOSIS — Z139 Encounter for screening, unspecified: Secondary | ICD-10-CM | POA: Diagnosis not present

## 2019-10-08 DIAGNOSIS — Z7189 Other specified counseling: Secondary | ICD-10-CM | POA: Diagnosis not present

## 2019-10-08 DIAGNOSIS — N183 Chronic kidney disease, stage 3 unspecified: Secondary | ICD-10-CM | POA: Diagnosis not present

## 2019-10-08 DIAGNOSIS — I50812 Chronic right heart failure: Secondary | ICD-10-CM | POA: Diagnosis not present

## 2019-10-08 DIAGNOSIS — Z0000 Encounter for general adult medical examination without abnormal findings: Secondary | ICD-10-CM | POA: Diagnosis not present

## 2019-10-08 DIAGNOSIS — I270 Primary pulmonary hypertension: Secondary | ICD-10-CM | POA: Diagnosis not present

## 2019-10-08 DIAGNOSIS — Z136 Encounter for screening for cardiovascular disorders: Secondary | ICD-10-CM | POA: Diagnosis not present

## 2019-10-09 DIAGNOSIS — R0602 Shortness of breath: Secondary | ICD-10-CM | POA: Diagnosis not present

## 2019-10-09 DIAGNOSIS — I2721 Secondary pulmonary arterial hypertension: Secondary | ICD-10-CM | POA: Diagnosis not present

## 2019-10-09 DIAGNOSIS — J449 Chronic obstructive pulmonary disease, unspecified: Secondary | ICD-10-CM | POA: Diagnosis not present

## 2019-10-09 DIAGNOSIS — G4733 Obstructive sleep apnea (adult) (pediatric): Secondary | ICD-10-CM | POA: Diagnosis not present

## 2019-10-15 DIAGNOSIS — N183 Chronic kidney disease, stage 3 unspecified: Secondary | ICD-10-CM | POA: Diagnosis not present

## 2019-10-21 DIAGNOSIS — J449 Chronic obstructive pulmonary disease, unspecified: Secondary | ICD-10-CM | POA: Diagnosis not present

## 2019-10-21 DIAGNOSIS — G4733 Obstructive sleep apnea (adult) (pediatric): Secondary | ICD-10-CM | POA: Diagnosis not present

## 2019-10-25 DIAGNOSIS — I270 Primary pulmonary hypertension: Secondary | ICD-10-CM | POA: Diagnosis not present

## 2019-10-28 DIAGNOSIS — G4734 Idiopathic sleep related nonobstructive alveolar hypoventilation: Secondary | ICD-10-CM | POA: Diagnosis not present

## 2019-11-02 ENCOUNTER — Telehealth: Payer: Self-pay | Admitting: Adult Health Nurse Practitioner

## 2019-11-02 NOTE — Telephone Encounter (Signed)
Spoke with patient and scheduled visit for 11/03/19 at 3:30 Kristen Montgomery K. Olena Heckle NP

## 2019-11-03 ENCOUNTER — Other Ambulatory Visit: Payer: Medicare Other | Admitting: Adult Health Nurse Practitioner

## 2019-11-03 ENCOUNTER — Other Ambulatory Visit: Payer: Self-pay

## 2019-11-03 DIAGNOSIS — I272 Pulmonary hypertension, unspecified: Secondary | ICD-10-CM

## 2019-11-03 DIAGNOSIS — Z515 Encounter for palliative care: Secondary | ICD-10-CM | POA: Diagnosis not present

## 2019-11-03 NOTE — Progress Notes (Signed)
[facility] Consult Note Telephone: [phone]  Fax: [phone]  PATIENT NAME: Kristen Montgomery DOB: 1950-10-23 MRN: [id number]  PRIMARY CARE PROVIDER:   Marco Collie, MD  REFERRING PROVIDER:  Marco Collie, MD [address] [place],  [place] [address]  [place]:   Self [phone] [place], [place] [phone]    RECOMMENDATIONS and PLAN:  1.  Advanced care planning. Patient is a full code.  Reviewed MOST form with patient.  Encouraged to discuss with her family.  She does indicate that she would like full hospital interventions but only for a short period of time.  2. Pulmonary HTN/COPD.  Patient now uses O2 at 4L via nasal canula and at night uses 8L on BiPAP.  She states that she does increase her O2 when she takes a bath.  She requires assistance with bathing but is able to dress herself. Her sister and a lady from church come once a month to do a thorough cleaning of her house and her daughter comes every other day to keep things tidy and to help bathe her. She is having increased DOE and takes frequent rest breaks.  She tries to do exercises while sitting and will do stretches while standing next to a wall.  She is on Tyvaso, spiriva, and albuterol nebulizer treatments.  Continue follow up and recommendations by pulmonology.  3.  CHF.  Patient currently on Lasix 40 mg daily.  Denies increased cough, orthopnea, PND, chest pain.  States that she is trying to cut reduce salt in her diet.  Has cut out chips, started using Mrs. Dash salt substitute.  Today has no noted edema. States that when she does get too much salt she will have increased edema that is reduced with propping up.  Continue diet changes and ways to decrease sodium  Patient is having disease progression.  Palliative will continue to monitor for symptom management/decline and make recommendations as needed.  Prefers a call in 6-8 weeks to  schedule follow up visit if needed.  Encouraged to call with any questions or concerns.  I spent 100 minutes providing this consultation,  from 3:30 to 5:10 including time spent with patient, chart review, provider coordination, documentation. More than 50% of the time in this consultation was spent coordinating communication.   HISTORY OF PRESENT ILLNESS:  Kristen Montgomery is a 69 y.o. year old female with multiple medical problems including pulmonary HTN, COPD, CHF. Palliative Care was asked to help address goals of care.   CODE STATUS: full code  PPS: 40% HOSPICE ELIGIBILITY/DIAGNOSIS: TBD  PHYSICAL EXAM:  BP 112/62  HR  86  O2 96% on 4L General: NAD, frail appearing Cardiovascular: regular rate and rhythm Pulmonary: lung sounds diminished with no abnormal sounds heard; normal respiratory effort Extremities: no edema, no joint deformities Skin: no rashes on exposed skin Neurological: Weakness but otherwise nonfocal  PAST MEDICAL HISTORY: No past medical history on file.  SOCIAL HX:  Social History   Tobacco Use  . Smoking status: Not on file  Substance Use Topics  . Alcohol use: Not on file    ALLERGIES: No Known Allergies   PERTINENT MEDICATIONS:  Outpatient Encounter Medications as of 11/03/2019  Medication Sig  . albuterol (ACCUNEB) 0.63 MG/3ML nebulizer solution Inhale 1 ampule by nebulization every six (6) hours as needed for wheezing.  Marland Kitchen albuterol (VENTOLIN HFA) 108 (90 Base) MCG/ACT inhaler INHALE 2 PUFFS BY MOUTH EVERY 4 TO 6 HOURS  AS NEEDED FOR SHORTNESS OF BREATH, COUGH OR WHEEZE  . aspirin EC 81 MG tablet Take by mouth.  . carvedilol (COREG) 3.125 MG tablet Take 3.125 mg by mouth.  . docusate sodium (COLACE) 100 MG capsule Take 100 mg by mouth.  . furosemide (LASIX) 40 MG tablet Take 40 mg by mouth.  . losartan (COZAAR) 50 MG tablet Take 50 mg by mouth 1 day or 1 dose.  Marland Kitchen omeprazole (PRILOSEC) 20 MG capsule Take 20 mg by mouth.  . potassium chloride SA  (K-DUR,KLOR-CON) 20 MEQ tablet Take 10 mEq by mouth.  . tiotropium (SPIRIVA HANDIHALER) 18 MCG inhalation capsule INHALE 1 CAPSULE VIA HANDIHALER ONCE DAILY AT THE SAME TIME EVERY DAY  . traZODone (DESYREL) 50 MG tablet TAKE 1 TABLET BY MOUTH AT BEDTIME AS NEEDED  . Treprostinil (TYVASO) 0.6 MG/ML SOLN Inhale 3-9 breaths four times daily.   No facility-administered encounter medications on file as of 11/03/2019.      Amram Maya Jenetta Downer, NP

## 2019-11-06 DIAGNOSIS — J449 Chronic obstructive pulmonary disease, unspecified: Secondary | ICD-10-CM | POA: Diagnosis not present

## 2019-11-20 DIAGNOSIS — J449 Chronic obstructive pulmonary disease, unspecified: Secondary | ICD-10-CM | POA: Diagnosis not present

## 2019-11-22 DIAGNOSIS — I270 Primary pulmonary hypertension: Secondary | ICD-10-CM | POA: Diagnosis not present

## 2019-12-01 DIAGNOSIS — H6593 Unspecified nonsuppurative otitis media, bilateral: Secondary | ICD-10-CM | POA: Diagnosis not present

## 2019-12-01 DIAGNOSIS — I50812 Chronic right heart failure: Secondary | ICD-10-CM | POA: Diagnosis not present

## 2019-12-02 ENCOUNTER — Telehealth: Payer: Self-pay

## 2019-12-02 NOTE — Telephone Encounter (Signed)
Volunteer support call for palliative care, message left

## 2019-12-06 DIAGNOSIS — J449 Chronic obstructive pulmonary disease, unspecified: Secondary | ICD-10-CM | POA: Diagnosis not present

## 2019-12-20 DIAGNOSIS — I270 Primary pulmonary hypertension: Secondary | ICD-10-CM | POA: Diagnosis not present

## 2019-12-21 DIAGNOSIS — J449 Chronic obstructive pulmonary disease, unspecified: Secondary | ICD-10-CM | POA: Diagnosis not present

## 2020-01-06 DIAGNOSIS — J449 Chronic obstructive pulmonary disease, unspecified: Secondary | ICD-10-CM | POA: Diagnosis not present

## 2020-01-07 DIAGNOSIS — G4733 Obstructive sleep apnea (adult) (pediatric): Secondary | ICD-10-CM | POA: Diagnosis not present

## 2020-01-17 DIAGNOSIS — I270 Primary pulmonary hypertension: Secondary | ICD-10-CM | POA: Diagnosis not present

## 2020-01-20 DIAGNOSIS — J449 Chronic obstructive pulmonary disease, unspecified: Secondary | ICD-10-CM | POA: Diagnosis not present

## 2020-01-28 DIAGNOSIS — E782 Mixed hyperlipidemia: Secondary | ICD-10-CM | POA: Diagnosis not present

## 2020-02-04 DIAGNOSIS — J449 Chronic obstructive pulmonary disease, unspecified: Secondary | ICD-10-CM | POA: Diagnosis not present

## 2020-02-04 DIAGNOSIS — E782 Mixed hyperlipidemia: Secondary | ICD-10-CM | POA: Diagnosis not present

## 2020-02-04 DIAGNOSIS — I50811 Acute right heart failure: Secondary | ICD-10-CM | POA: Diagnosis not present

## 2020-02-04 DIAGNOSIS — I50812 Chronic right heart failure: Secondary | ICD-10-CM | POA: Diagnosis not present

## 2020-02-05 DIAGNOSIS — J449 Chronic obstructive pulmonary disease, unspecified: Secondary | ICD-10-CM | POA: Diagnosis not present

## 2020-02-08 DIAGNOSIS — G4733 Obstructive sleep apnea (adult) (pediatric): Secondary | ICD-10-CM | POA: Diagnosis not present

## 2020-02-14 DIAGNOSIS — I270 Primary pulmonary hypertension: Secondary | ICD-10-CM | POA: Diagnosis not present

## 2020-02-20 DIAGNOSIS — J449 Chronic obstructive pulmonary disease, unspecified: Secondary | ICD-10-CM | POA: Diagnosis not present

## 2020-03-13 DIAGNOSIS — I270 Primary pulmonary hypertension: Secondary | ICD-10-CM | POA: Diagnosis not present

## 2020-03-22 DIAGNOSIS — J449 Chronic obstructive pulmonary disease, unspecified: Secondary | ICD-10-CM | POA: Diagnosis not present

## 2020-04-05 DIAGNOSIS — J449 Chronic obstructive pulmonary disease, unspecified: Secondary | ICD-10-CM | POA: Diagnosis not present

## 2020-04-05 DIAGNOSIS — R0602 Shortness of breath: Secondary | ICD-10-CM | POA: Diagnosis not present

## 2020-04-05 DIAGNOSIS — Z7951 Long term (current) use of inhaled steroids: Secondary | ICD-10-CM | POA: Diagnosis not present

## 2020-04-05 DIAGNOSIS — R609 Edema, unspecified: Secondary | ICD-10-CM | POA: Diagnosis not present

## 2020-04-05 DIAGNOSIS — Z9981 Dependence on supplemental oxygen: Secondary | ICD-10-CM | POA: Diagnosis not present

## 2020-04-05 DIAGNOSIS — I2721 Secondary pulmonary arterial hypertension: Secondary | ICD-10-CM | POA: Diagnosis not present

## 2020-04-05 DIAGNOSIS — Z23 Encounter for immunization: Secondary | ICD-10-CM | POA: Diagnosis not present

## 2020-04-05 DIAGNOSIS — R0902 Hypoxemia: Secondary | ICD-10-CM | POA: Diagnosis not present

## 2020-04-05 DIAGNOSIS — M25559 Pain in unspecified hip: Secondary | ICD-10-CM | POA: Diagnosis not present

## 2020-04-05 DIAGNOSIS — I10 Essential (primary) hypertension: Secondary | ICD-10-CM | POA: Diagnosis not present

## 2020-04-05 DIAGNOSIS — Z96641 Presence of right artificial hip joint: Secondary | ICD-10-CM | POA: Diagnosis not present

## 2020-04-05 DIAGNOSIS — Z87891 Personal history of nicotine dependence: Secondary | ICD-10-CM | POA: Diagnosis not present

## 2020-04-05 DIAGNOSIS — I071 Rheumatic tricuspid insufficiency: Secondary | ICD-10-CM | POA: Diagnosis not present

## 2020-04-05 DIAGNOSIS — I471 Supraventricular tachycardia: Secondary | ICD-10-CM | POA: Diagnosis not present

## 2020-04-05 DIAGNOSIS — G4733 Obstructive sleep apnea (adult) (pediatric): Secondary | ICD-10-CM | POA: Diagnosis not present

## 2020-04-05 DIAGNOSIS — Z7982 Long term (current) use of aspirin: Secondary | ICD-10-CM | POA: Diagnosis not present

## 2020-04-10 DIAGNOSIS — I270 Primary pulmonary hypertension: Secondary | ICD-10-CM | POA: Diagnosis not present

## 2020-04-20 DIAGNOSIS — R0902 Hypoxemia: Secondary | ICD-10-CM | POA: Diagnosis not present

## 2020-04-20 DIAGNOSIS — R0602 Shortness of breath: Secondary | ICD-10-CM | POA: Diagnosis not present

## 2020-04-20 DIAGNOSIS — J449 Chronic obstructive pulmonary disease, unspecified: Secondary | ICD-10-CM | POA: Diagnosis not present

## 2020-04-20 DIAGNOSIS — I071 Rheumatic tricuspid insufficiency: Secondary | ICD-10-CM | POA: Diagnosis not present

## 2020-04-20 DIAGNOSIS — I272 Pulmonary hypertension, unspecified: Secondary | ICD-10-CM | POA: Diagnosis not present

## 2020-04-20 DIAGNOSIS — Z23 Encounter for immunization: Secondary | ICD-10-CM | POA: Diagnosis not present

## 2020-04-20 DIAGNOSIS — I2721 Secondary pulmonary arterial hypertension: Secondary | ICD-10-CM | POA: Diagnosis not present

## 2020-04-20 DIAGNOSIS — Z9981 Dependence on supplemental oxygen: Secondary | ICD-10-CM | POA: Diagnosis not present

## 2020-04-20 DIAGNOSIS — G4733 Obstructive sleep apnea (adult) (pediatric): Secondary | ICD-10-CM | POA: Diagnosis not present

## 2020-04-21 DIAGNOSIS — J449 Chronic obstructive pulmonary disease, unspecified: Secondary | ICD-10-CM | POA: Diagnosis not present

## 2020-05-08 DIAGNOSIS — I270 Primary pulmonary hypertension: Secondary | ICD-10-CM | POA: Diagnosis not present

## 2020-05-22 DIAGNOSIS — J449 Chronic obstructive pulmonary disease, unspecified: Secondary | ICD-10-CM | POA: Diagnosis not present

## 2020-05-31 DIAGNOSIS — E782 Mixed hyperlipidemia: Secondary | ICD-10-CM | POA: Diagnosis not present

## 2020-06-05 DIAGNOSIS — I270 Primary pulmonary hypertension: Secondary | ICD-10-CM | POA: Diagnosis not present

## 2020-06-07 DIAGNOSIS — E782 Mixed hyperlipidemia: Secondary | ICD-10-CM | POA: Diagnosis not present

## 2020-06-07 DIAGNOSIS — N183 Chronic kidney disease, stage 3 unspecified: Secondary | ICD-10-CM | POA: Diagnosis not present

## 2020-06-07 DIAGNOSIS — J449 Chronic obstructive pulmonary disease, unspecified: Secondary | ICD-10-CM | POA: Diagnosis not present

## 2020-06-07 DIAGNOSIS — I50810 Right heart failure, unspecified: Secondary | ICD-10-CM | POA: Diagnosis not present

## 2020-06-07 DIAGNOSIS — I10 Essential (primary) hypertension: Secondary | ICD-10-CM | POA: Diagnosis not present

## 2020-06-21 DIAGNOSIS — J449 Chronic obstructive pulmonary disease, unspecified: Secondary | ICD-10-CM | POA: Diagnosis not present

## 2020-06-21 DIAGNOSIS — H6593 Unspecified nonsuppurative otitis media, bilateral: Secondary | ICD-10-CM | POA: Diagnosis not present

## 2020-06-21 DIAGNOSIS — N1831 Chronic kidney disease, stage 3a: Secondary | ICD-10-CM | POA: Diagnosis not present

## 2020-06-21 DIAGNOSIS — I50810 Right heart failure, unspecified: Secondary | ICD-10-CM | POA: Diagnosis not present

## 2020-07-03 DIAGNOSIS — I270 Primary pulmonary hypertension: Secondary | ICD-10-CM | POA: Diagnosis not present

## 2020-07-12 DIAGNOSIS — R059 Cough, unspecified: Secondary | ICD-10-CM | POA: Diagnosis not present

## 2020-07-12 DIAGNOSIS — R609 Edema, unspecified: Secondary | ICD-10-CM | POA: Diagnosis not present

## 2020-07-12 DIAGNOSIS — Z87891 Personal history of nicotine dependence: Secondary | ICD-10-CM | POA: Diagnosis not present

## 2020-07-12 DIAGNOSIS — I272 Pulmonary hypertension, unspecified: Secondary | ICD-10-CM | POA: Diagnosis not present

## 2020-07-12 DIAGNOSIS — J439 Emphysema, unspecified: Secondary | ICD-10-CM | POA: Diagnosis not present

## 2020-07-12 DIAGNOSIS — R509 Fever, unspecified: Secondary | ICD-10-CM | POA: Diagnosis not present

## 2020-07-12 DIAGNOSIS — Z79899 Other long term (current) drug therapy: Secondary | ICD-10-CM | POA: Diagnosis not present

## 2020-07-12 DIAGNOSIS — Z7982 Long term (current) use of aspirin: Secondary | ICD-10-CM | POA: Diagnosis not present

## 2020-07-12 DIAGNOSIS — R519 Headache, unspecified: Secondary | ICD-10-CM | POA: Diagnosis not present

## 2020-07-12 DIAGNOSIS — U071 COVID-19: Secondary | ICD-10-CM | POA: Diagnosis not present

## 2020-07-12 DIAGNOSIS — R0602 Shortness of breath: Secondary | ICD-10-CM | POA: Diagnosis not present

## 2020-07-16 DIAGNOSIS — I270 Primary pulmonary hypertension: Secondary | ICD-10-CM | POA: Diagnosis not present

## 2020-07-18 DIAGNOSIS — R5383 Other fatigue: Secondary | ICD-10-CM | POA: Diagnosis not present

## 2020-07-18 DIAGNOSIS — K219 Gastro-esophageal reflux disease without esophagitis: Secondary | ICD-10-CM | POA: Diagnosis not present

## 2020-07-18 DIAGNOSIS — Z9981 Dependence on supplemental oxygen: Secondary | ICD-10-CM | POA: Diagnosis not present

## 2020-07-18 DIAGNOSIS — J9621 Acute and chronic respiratory failure with hypoxia: Secondary | ICD-10-CM | POA: Diagnosis not present

## 2020-07-18 DIAGNOSIS — E872 Acidosis: Secondary | ICD-10-CM | POA: Diagnosis not present

## 2020-07-18 DIAGNOSIS — R609 Edema, unspecified: Secondary | ICD-10-CM | POA: Diagnosis not present

## 2020-07-18 DIAGNOSIS — R531 Weakness: Secondary | ICD-10-CM | POA: Diagnosis not present

## 2020-07-18 DIAGNOSIS — J1282 Pneumonia due to coronavirus disease 2019: Secondary | ICD-10-CM | POA: Diagnosis not present

## 2020-07-18 DIAGNOSIS — I5032 Chronic diastolic (congestive) heart failure: Secondary | ICD-10-CM | POA: Diagnosis not present

## 2020-07-18 DIAGNOSIS — N183 Chronic kidney disease, stage 3 unspecified: Secondary | ICD-10-CM | POA: Diagnosis not present

## 2020-07-18 DIAGNOSIS — E876 Hypokalemia: Secondary | ICD-10-CM | POA: Diagnosis not present

## 2020-07-18 DIAGNOSIS — J9622 Acute and chronic respiratory failure with hypercapnia: Secondary | ICD-10-CM | POA: Diagnosis not present

## 2020-07-18 DIAGNOSIS — R0689 Other abnormalities of breathing: Secondary | ICD-10-CM | POA: Diagnosis not present

## 2020-07-18 DIAGNOSIS — R0602 Shortness of breath: Secondary | ICD-10-CM | POA: Diagnosis not present

## 2020-07-18 DIAGNOSIS — D649 Anemia, unspecified: Secondary | ICD-10-CM | POA: Diagnosis not present

## 2020-07-18 DIAGNOSIS — I130 Hypertensive heart and chronic kidney disease with heart failure and stage 1 through stage 4 chronic kidney disease, or unspecified chronic kidney disease: Secondary | ICD-10-CM | POA: Diagnosis not present

## 2020-07-18 DIAGNOSIS — J440 Chronic obstructive pulmonary disease with acute lower respiratory infection: Secondary | ICD-10-CM | POA: Diagnosis not present

## 2020-07-18 DIAGNOSIS — R918 Other nonspecific abnormal finding of lung field: Secondary | ICD-10-CM | POA: Diagnosis not present

## 2020-07-18 DIAGNOSIS — R0902 Hypoxemia: Secondary | ICD-10-CM | POA: Diagnosis not present

## 2020-07-18 DIAGNOSIS — R0989 Other specified symptoms and signs involving the circulatory and respiratory systems: Secondary | ICD-10-CM | POA: Diagnosis not present

## 2020-07-18 DIAGNOSIS — J449 Chronic obstructive pulmonary disease, unspecified: Secondary | ICD-10-CM | POA: Diagnosis not present

## 2020-07-18 DIAGNOSIS — N179 Acute kidney failure, unspecified: Secondary | ICD-10-CM | POA: Diagnosis not present

## 2020-07-18 DIAGNOSIS — Z7982 Long term (current) use of aspirin: Secondary | ICD-10-CM | POA: Diagnosis not present

## 2020-07-18 DIAGNOSIS — J441 Chronic obstructive pulmonary disease with (acute) exacerbation: Secondary | ICD-10-CM | POA: Diagnosis not present

## 2020-07-18 DIAGNOSIS — U071 COVID-19: Secondary | ICD-10-CM | POA: Diagnosis not present

## 2020-07-18 DIAGNOSIS — Z87891 Personal history of nicotine dependence: Secondary | ICD-10-CM | POA: Diagnosis not present

## 2020-07-18 DIAGNOSIS — I272 Pulmonary hypertension, unspecified: Secondary | ICD-10-CM | POA: Diagnosis not present

## 2020-07-18 DIAGNOSIS — R109 Unspecified abdominal pain: Secondary | ICD-10-CM | POA: Diagnosis not present

## 2020-07-29 DIAGNOSIS — R531 Weakness: Secondary | ICD-10-CM | POA: Diagnosis not present

## 2020-07-29 DIAGNOSIS — Z96651 Presence of right artificial knee joint: Secondary | ICD-10-CM | POA: Diagnosis not present

## 2020-07-29 DIAGNOSIS — R2681 Unsteadiness on feet: Secondary | ICD-10-CM | POA: Diagnosis not present

## 2020-07-29 DIAGNOSIS — U071 COVID-19: Secondary | ICD-10-CM | POA: Diagnosis not present

## 2020-07-29 DIAGNOSIS — Z87891 Personal history of nicotine dependence: Secondary | ICD-10-CM | POA: Diagnosis not present

## 2020-07-29 DIAGNOSIS — J9622 Acute and chronic respiratory failure with hypercapnia: Secondary | ICD-10-CM | POA: Diagnosis not present

## 2020-07-29 DIAGNOSIS — D631 Anemia in chronic kidney disease: Secondary | ICD-10-CM | POA: Diagnosis not present

## 2020-07-29 DIAGNOSIS — N183 Chronic kidney disease, stage 3 unspecified: Secondary | ICD-10-CM | POA: Diagnosis not present

## 2020-07-29 DIAGNOSIS — I129 Hypertensive chronic kidney disease with stage 1 through stage 4 chronic kidney disease, or unspecified chronic kidney disease: Secondary | ICD-10-CM | POA: Diagnosis not present

## 2020-07-29 DIAGNOSIS — Z9981 Dependence on supplemental oxygen: Secondary | ICD-10-CM | POA: Diagnosis not present

## 2020-07-29 DIAGNOSIS — J9621 Acute and chronic respiratory failure with hypoxia: Secondary | ICD-10-CM | POA: Diagnosis not present

## 2020-07-29 DIAGNOSIS — I272 Pulmonary hypertension, unspecified: Secondary | ICD-10-CM | POA: Diagnosis not present

## 2020-07-29 DIAGNOSIS — N179 Acute kidney failure, unspecified: Secondary | ICD-10-CM | POA: Diagnosis not present

## 2020-07-29 DIAGNOSIS — J440 Chronic obstructive pulmonary disease with acute lower respiratory infection: Secondary | ICD-10-CM | POA: Diagnosis not present

## 2020-07-29 DIAGNOSIS — J1282 Pneumonia due to coronavirus disease 2019: Secondary | ICD-10-CM | POA: Diagnosis not present

## 2020-07-29 DIAGNOSIS — Z7951 Long term (current) use of inhaled steroids: Secondary | ICD-10-CM | POA: Diagnosis not present

## 2020-07-31 DIAGNOSIS — I270 Primary pulmonary hypertension: Secondary | ICD-10-CM | POA: Diagnosis not present

## 2020-08-02 DIAGNOSIS — I50811 Acute right heart failure: Secondary | ICD-10-CM | POA: Diagnosis not present

## 2020-08-02 DIAGNOSIS — Z7689 Persons encountering health services in other specified circumstances: Secondary | ICD-10-CM | POA: Diagnosis not present

## 2020-08-02 DIAGNOSIS — I270 Primary pulmonary hypertension: Secondary | ICD-10-CM | POA: Diagnosis not present

## 2020-08-02 DIAGNOSIS — I509 Heart failure, unspecified: Secondary | ICD-10-CM | POA: Diagnosis not present

## 2020-08-02 DIAGNOSIS — Z8616 Personal history of COVID-19: Secondary | ICD-10-CM | POA: Diagnosis not present

## 2020-08-03 DIAGNOSIS — N183 Chronic kidney disease, stage 3 unspecified: Secondary | ICD-10-CM | POA: Diagnosis not present

## 2020-08-03 DIAGNOSIS — J1282 Pneumonia due to coronavirus disease 2019: Secondary | ICD-10-CM | POA: Diagnosis not present

## 2020-08-03 DIAGNOSIS — Z9981 Dependence on supplemental oxygen: Secondary | ICD-10-CM | POA: Diagnosis not present

## 2020-08-03 DIAGNOSIS — N179 Acute kidney failure, unspecified: Secondary | ICD-10-CM | POA: Diagnosis not present

## 2020-08-03 DIAGNOSIS — Z96651 Presence of right artificial knee joint: Secondary | ICD-10-CM | POA: Diagnosis not present

## 2020-08-03 DIAGNOSIS — J9622 Acute and chronic respiratory failure with hypercapnia: Secondary | ICD-10-CM | POA: Diagnosis not present

## 2020-08-03 DIAGNOSIS — U071 COVID-19: Secondary | ICD-10-CM | POA: Diagnosis not present

## 2020-08-03 DIAGNOSIS — I272 Pulmonary hypertension, unspecified: Secondary | ICD-10-CM | POA: Diagnosis not present

## 2020-08-03 DIAGNOSIS — R531 Weakness: Secondary | ICD-10-CM | POA: Diagnosis not present

## 2020-08-03 DIAGNOSIS — Z87891 Personal history of nicotine dependence: Secondary | ICD-10-CM | POA: Diagnosis not present

## 2020-08-03 DIAGNOSIS — Z7951 Long term (current) use of inhaled steroids: Secondary | ICD-10-CM | POA: Diagnosis not present

## 2020-08-03 DIAGNOSIS — I129 Hypertensive chronic kidney disease with stage 1 through stage 4 chronic kidney disease, or unspecified chronic kidney disease: Secondary | ICD-10-CM | POA: Diagnosis not present

## 2020-08-03 DIAGNOSIS — J440 Chronic obstructive pulmonary disease with acute lower respiratory infection: Secondary | ICD-10-CM | POA: Diagnosis not present

## 2020-08-03 DIAGNOSIS — D631 Anemia in chronic kidney disease: Secondary | ICD-10-CM | POA: Diagnosis not present

## 2020-08-03 DIAGNOSIS — J9621 Acute and chronic respiratory failure with hypoxia: Secondary | ICD-10-CM | POA: Diagnosis not present

## 2020-08-03 DIAGNOSIS — R2681 Unsteadiness on feet: Secondary | ICD-10-CM | POA: Diagnosis not present

## 2020-08-04 DIAGNOSIS — R531 Weakness: Secondary | ICD-10-CM | POA: Diagnosis not present

## 2020-08-04 DIAGNOSIS — I272 Pulmonary hypertension, unspecified: Secondary | ICD-10-CM | POA: Diagnosis not present

## 2020-08-04 DIAGNOSIS — Z7951 Long term (current) use of inhaled steroids: Secondary | ICD-10-CM | POA: Diagnosis not present

## 2020-08-04 DIAGNOSIS — D631 Anemia in chronic kidney disease: Secondary | ICD-10-CM | POA: Diagnosis not present

## 2020-08-04 DIAGNOSIS — J1282 Pneumonia due to coronavirus disease 2019: Secondary | ICD-10-CM | POA: Diagnosis not present

## 2020-08-04 DIAGNOSIS — N183 Chronic kidney disease, stage 3 unspecified: Secondary | ICD-10-CM | POA: Diagnosis not present

## 2020-08-04 DIAGNOSIS — Z96651 Presence of right artificial knee joint: Secondary | ICD-10-CM | POA: Diagnosis not present

## 2020-08-04 DIAGNOSIS — J9622 Acute and chronic respiratory failure with hypercapnia: Secondary | ICD-10-CM | POA: Diagnosis not present

## 2020-08-04 DIAGNOSIS — J9621 Acute and chronic respiratory failure with hypoxia: Secondary | ICD-10-CM | POA: Diagnosis not present

## 2020-08-04 DIAGNOSIS — N179 Acute kidney failure, unspecified: Secondary | ICD-10-CM | POA: Diagnosis not present

## 2020-08-04 DIAGNOSIS — Z87891 Personal history of nicotine dependence: Secondary | ICD-10-CM | POA: Diagnosis not present

## 2020-08-04 DIAGNOSIS — Z9981 Dependence on supplemental oxygen: Secondary | ICD-10-CM | POA: Diagnosis not present

## 2020-08-04 DIAGNOSIS — I129 Hypertensive chronic kidney disease with stage 1 through stage 4 chronic kidney disease, or unspecified chronic kidney disease: Secondary | ICD-10-CM | POA: Diagnosis not present

## 2020-08-04 DIAGNOSIS — J440 Chronic obstructive pulmonary disease with acute lower respiratory infection: Secondary | ICD-10-CM | POA: Diagnosis not present

## 2020-08-04 DIAGNOSIS — U071 COVID-19: Secondary | ICD-10-CM | POA: Diagnosis not present

## 2020-08-04 DIAGNOSIS — R2681 Unsteadiness on feet: Secondary | ICD-10-CM | POA: Diagnosis not present

## 2020-08-05 DIAGNOSIS — R531 Weakness: Secondary | ICD-10-CM | POA: Diagnosis not present

## 2020-08-05 DIAGNOSIS — I272 Pulmonary hypertension, unspecified: Secondary | ICD-10-CM | POA: Diagnosis not present

## 2020-08-05 DIAGNOSIS — J9622 Acute and chronic respiratory failure with hypercapnia: Secondary | ICD-10-CM | POA: Diagnosis not present

## 2020-08-05 DIAGNOSIS — J9621 Acute and chronic respiratory failure with hypoxia: Secondary | ICD-10-CM | POA: Diagnosis not present

## 2020-08-05 DIAGNOSIS — J440 Chronic obstructive pulmonary disease with acute lower respiratory infection: Secondary | ICD-10-CM | POA: Diagnosis not present

## 2020-08-05 DIAGNOSIS — Z7951 Long term (current) use of inhaled steroids: Secondary | ICD-10-CM | POA: Diagnosis not present

## 2020-08-05 DIAGNOSIS — R2681 Unsteadiness on feet: Secondary | ICD-10-CM | POA: Diagnosis not present

## 2020-08-05 DIAGNOSIS — N179 Acute kidney failure, unspecified: Secondary | ICD-10-CM | POA: Diagnosis not present

## 2020-08-05 DIAGNOSIS — I129 Hypertensive chronic kidney disease with stage 1 through stage 4 chronic kidney disease, or unspecified chronic kidney disease: Secondary | ICD-10-CM | POA: Diagnosis not present

## 2020-08-05 DIAGNOSIS — Z9981 Dependence on supplemental oxygen: Secondary | ICD-10-CM | POA: Diagnosis not present

## 2020-08-05 DIAGNOSIS — D631 Anemia in chronic kidney disease: Secondary | ICD-10-CM | POA: Diagnosis not present

## 2020-08-05 DIAGNOSIS — J1282 Pneumonia due to coronavirus disease 2019: Secondary | ICD-10-CM | POA: Diagnosis not present

## 2020-08-05 DIAGNOSIS — Z87891 Personal history of nicotine dependence: Secondary | ICD-10-CM | POA: Diagnosis not present

## 2020-08-05 DIAGNOSIS — N183 Chronic kidney disease, stage 3 unspecified: Secondary | ICD-10-CM | POA: Diagnosis not present

## 2020-08-05 DIAGNOSIS — Z96651 Presence of right artificial knee joint: Secondary | ICD-10-CM | POA: Diagnosis not present

## 2020-08-05 DIAGNOSIS — U071 COVID-19: Secondary | ICD-10-CM | POA: Diagnosis not present

## 2020-08-09 DIAGNOSIS — Z7951 Long term (current) use of inhaled steroids: Secondary | ICD-10-CM | POA: Diagnosis not present

## 2020-08-09 DIAGNOSIS — J9622 Acute and chronic respiratory failure with hypercapnia: Secondary | ICD-10-CM | POA: Diagnosis not present

## 2020-08-09 DIAGNOSIS — I272 Pulmonary hypertension, unspecified: Secondary | ICD-10-CM | POA: Diagnosis not present

## 2020-08-09 DIAGNOSIS — J9621 Acute and chronic respiratory failure with hypoxia: Secondary | ICD-10-CM | POA: Diagnosis not present

## 2020-08-09 DIAGNOSIS — R531 Weakness: Secondary | ICD-10-CM | POA: Diagnosis not present

## 2020-08-09 DIAGNOSIS — I129 Hypertensive chronic kidney disease with stage 1 through stage 4 chronic kidney disease, or unspecified chronic kidney disease: Secondary | ICD-10-CM | POA: Diagnosis not present

## 2020-08-09 DIAGNOSIS — R2681 Unsteadiness on feet: Secondary | ICD-10-CM | POA: Diagnosis not present

## 2020-08-09 DIAGNOSIS — N179 Acute kidney failure, unspecified: Secondary | ICD-10-CM | POA: Diagnosis not present

## 2020-08-09 DIAGNOSIS — D631 Anemia in chronic kidney disease: Secondary | ICD-10-CM | POA: Diagnosis not present

## 2020-08-09 DIAGNOSIS — Z87891 Personal history of nicotine dependence: Secondary | ICD-10-CM | POA: Diagnosis not present

## 2020-08-09 DIAGNOSIS — J1282 Pneumonia due to coronavirus disease 2019: Secondary | ICD-10-CM | POA: Diagnosis not present

## 2020-08-09 DIAGNOSIS — Z9981 Dependence on supplemental oxygen: Secondary | ICD-10-CM | POA: Diagnosis not present

## 2020-08-09 DIAGNOSIS — U071 COVID-19: Secondary | ICD-10-CM | POA: Diagnosis not present

## 2020-08-09 DIAGNOSIS — J440 Chronic obstructive pulmonary disease with acute lower respiratory infection: Secondary | ICD-10-CM | POA: Diagnosis not present

## 2020-08-09 DIAGNOSIS — N183 Chronic kidney disease, stage 3 unspecified: Secondary | ICD-10-CM | POA: Diagnosis not present

## 2020-08-09 DIAGNOSIS — Z96651 Presence of right artificial knee joint: Secondary | ICD-10-CM | POA: Diagnosis not present

## 2020-08-11 DIAGNOSIS — D631 Anemia in chronic kidney disease: Secondary | ICD-10-CM | POA: Diagnosis not present

## 2020-08-11 DIAGNOSIS — N179 Acute kidney failure, unspecified: Secondary | ICD-10-CM | POA: Diagnosis not present

## 2020-08-11 DIAGNOSIS — Z87891 Personal history of nicotine dependence: Secondary | ICD-10-CM | POA: Diagnosis not present

## 2020-08-11 DIAGNOSIS — R2681 Unsteadiness on feet: Secondary | ICD-10-CM | POA: Diagnosis not present

## 2020-08-11 DIAGNOSIS — N183 Chronic kidney disease, stage 3 unspecified: Secondary | ICD-10-CM | POA: Diagnosis not present

## 2020-08-11 DIAGNOSIS — U071 COVID-19: Secondary | ICD-10-CM | POA: Diagnosis not present

## 2020-08-11 DIAGNOSIS — I129 Hypertensive chronic kidney disease with stage 1 through stage 4 chronic kidney disease, or unspecified chronic kidney disease: Secondary | ICD-10-CM | POA: Diagnosis not present

## 2020-08-11 DIAGNOSIS — Z7951 Long term (current) use of inhaled steroids: Secondary | ICD-10-CM | POA: Diagnosis not present

## 2020-08-11 DIAGNOSIS — I272 Pulmonary hypertension, unspecified: Secondary | ICD-10-CM | POA: Diagnosis not present

## 2020-08-11 DIAGNOSIS — J440 Chronic obstructive pulmonary disease with acute lower respiratory infection: Secondary | ICD-10-CM | POA: Diagnosis not present

## 2020-08-11 DIAGNOSIS — J1282 Pneumonia due to coronavirus disease 2019: Secondary | ICD-10-CM | POA: Diagnosis not present

## 2020-08-11 DIAGNOSIS — Z9981 Dependence on supplemental oxygen: Secondary | ICD-10-CM | POA: Diagnosis not present

## 2020-08-11 DIAGNOSIS — J9622 Acute and chronic respiratory failure with hypercapnia: Secondary | ICD-10-CM | POA: Diagnosis not present

## 2020-08-11 DIAGNOSIS — R531 Weakness: Secondary | ICD-10-CM | POA: Diagnosis not present

## 2020-08-11 DIAGNOSIS — Z96651 Presence of right artificial knee joint: Secondary | ICD-10-CM | POA: Diagnosis not present

## 2020-08-11 DIAGNOSIS — J9621 Acute and chronic respiratory failure with hypoxia: Secondary | ICD-10-CM | POA: Diagnosis not present

## 2020-08-16 DIAGNOSIS — J1282 Pneumonia due to coronavirus disease 2019: Secondary | ICD-10-CM | POA: Diagnosis not present

## 2020-08-16 DIAGNOSIS — N183 Chronic kidney disease, stage 3 unspecified: Secondary | ICD-10-CM | POA: Diagnosis not present

## 2020-08-16 DIAGNOSIS — R2681 Unsteadiness on feet: Secondary | ICD-10-CM | POA: Diagnosis not present

## 2020-08-16 DIAGNOSIS — Z9981 Dependence on supplemental oxygen: Secondary | ICD-10-CM | POA: Diagnosis not present

## 2020-08-16 DIAGNOSIS — Z87891 Personal history of nicotine dependence: Secondary | ICD-10-CM | POA: Diagnosis not present

## 2020-08-16 DIAGNOSIS — R531 Weakness: Secondary | ICD-10-CM | POA: Diagnosis not present

## 2020-08-16 DIAGNOSIS — I272 Pulmonary hypertension, unspecified: Secondary | ICD-10-CM | POA: Diagnosis not present

## 2020-08-16 DIAGNOSIS — Z96651 Presence of right artificial knee joint: Secondary | ICD-10-CM | POA: Diagnosis not present

## 2020-08-16 DIAGNOSIS — I129 Hypertensive chronic kidney disease with stage 1 through stage 4 chronic kidney disease, or unspecified chronic kidney disease: Secondary | ICD-10-CM | POA: Diagnosis not present

## 2020-08-16 DIAGNOSIS — J440 Chronic obstructive pulmonary disease with acute lower respiratory infection: Secondary | ICD-10-CM | POA: Diagnosis not present

## 2020-08-16 DIAGNOSIS — U071 COVID-19: Secondary | ICD-10-CM | POA: Diagnosis not present

## 2020-08-16 DIAGNOSIS — J9621 Acute and chronic respiratory failure with hypoxia: Secondary | ICD-10-CM | POA: Diagnosis not present

## 2020-08-16 DIAGNOSIS — D631 Anemia in chronic kidney disease: Secondary | ICD-10-CM | POA: Diagnosis not present

## 2020-08-16 DIAGNOSIS — J9622 Acute and chronic respiratory failure with hypercapnia: Secondary | ICD-10-CM | POA: Diagnosis not present

## 2020-08-16 DIAGNOSIS — N179 Acute kidney failure, unspecified: Secondary | ICD-10-CM | POA: Diagnosis not present

## 2020-08-16 DIAGNOSIS — Z7951 Long term (current) use of inhaled steroids: Secondary | ICD-10-CM | POA: Diagnosis not present

## 2020-08-18 DIAGNOSIS — I272 Pulmonary hypertension, unspecified: Secondary | ICD-10-CM | POA: Diagnosis not present

## 2020-08-18 DIAGNOSIS — Z7951 Long term (current) use of inhaled steroids: Secondary | ICD-10-CM | POA: Diagnosis not present

## 2020-08-18 DIAGNOSIS — J440 Chronic obstructive pulmonary disease with acute lower respiratory infection: Secondary | ICD-10-CM | POA: Diagnosis not present

## 2020-08-18 DIAGNOSIS — Z9981 Dependence on supplemental oxygen: Secondary | ICD-10-CM | POA: Diagnosis not present

## 2020-08-18 DIAGNOSIS — Z96651 Presence of right artificial knee joint: Secondary | ICD-10-CM | POA: Diagnosis not present

## 2020-08-18 DIAGNOSIS — R2681 Unsteadiness on feet: Secondary | ICD-10-CM | POA: Diagnosis not present

## 2020-08-18 DIAGNOSIS — I129 Hypertensive chronic kidney disease with stage 1 through stage 4 chronic kidney disease, or unspecified chronic kidney disease: Secondary | ICD-10-CM | POA: Diagnosis not present

## 2020-08-18 DIAGNOSIS — D631 Anemia in chronic kidney disease: Secondary | ICD-10-CM | POA: Diagnosis not present

## 2020-08-18 DIAGNOSIS — N179 Acute kidney failure, unspecified: Secondary | ICD-10-CM | POA: Diagnosis not present

## 2020-08-18 DIAGNOSIS — J9622 Acute and chronic respiratory failure with hypercapnia: Secondary | ICD-10-CM | POA: Diagnosis not present

## 2020-08-18 DIAGNOSIS — N183 Chronic kidney disease, stage 3 unspecified: Secondary | ICD-10-CM | POA: Diagnosis not present

## 2020-08-18 DIAGNOSIS — R531 Weakness: Secondary | ICD-10-CM | POA: Diagnosis not present

## 2020-08-18 DIAGNOSIS — Z87891 Personal history of nicotine dependence: Secondary | ICD-10-CM | POA: Diagnosis not present

## 2020-08-18 DIAGNOSIS — J1282 Pneumonia due to coronavirus disease 2019: Secondary | ICD-10-CM | POA: Diagnosis not present

## 2020-08-18 DIAGNOSIS — U071 COVID-19: Secondary | ICD-10-CM | POA: Diagnosis not present

## 2020-08-18 DIAGNOSIS — J9621 Acute and chronic respiratory failure with hypoxia: Secondary | ICD-10-CM | POA: Diagnosis not present

## 2020-08-22 DIAGNOSIS — J449 Chronic obstructive pulmonary disease, unspecified: Secondary | ICD-10-CM | POA: Diagnosis not present

## 2020-08-23 DIAGNOSIS — R531 Weakness: Secondary | ICD-10-CM | POA: Diagnosis not present

## 2020-08-23 DIAGNOSIS — Z96651 Presence of right artificial knee joint: Secondary | ICD-10-CM | POA: Diagnosis not present

## 2020-08-23 DIAGNOSIS — I272 Pulmonary hypertension, unspecified: Secondary | ICD-10-CM | POA: Diagnosis not present

## 2020-08-23 DIAGNOSIS — J9621 Acute and chronic respiratory failure with hypoxia: Secondary | ICD-10-CM | POA: Diagnosis not present

## 2020-08-23 DIAGNOSIS — I129 Hypertensive chronic kidney disease with stage 1 through stage 4 chronic kidney disease, or unspecified chronic kidney disease: Secondary | ICD-10-CM | POA: Diagnosis not present

## 2020-08-23 DIAGNOSIS — Z87891 Personal history of nicotine dependence: Secondary | ICD-10-CM | POA: Diagnosis not present

## 2020-08-23 DIAGNOSIS — J1282 Pneumonia due to coronavirus disease 2019: Secondary | ICD-10-CM | POA: Diagnosis not present

## 2020-08-23 DIAGNOSIS — Z9981 Dependence on supplemental oxygen: Secondary | ICD-10-CM | POA: Diagnosis not present

## 2020-08-23 DIAGNOSIS — N183 Chronic kidney disease, stage 3 unspecified: Secondary | ICD-10-CM | POA: Diagnosis not present

## 2020-08-23 DIAGNOSIS — N179 Acute kidney failure, unspecified: Secondary | ICD-10-CM | POA: Diagnosis not present

## 2020-08-23 DIAGNOSIS — J440 Chronic obstructive pulmonary disease with acute lower respiratory infection: Secondary | ICD-10-CM | POA: Diagnosis not present

## 2020-08-23 DIAGNOSIS — U071 COVID-19: Secondary | ICD-10-CM | POA: Diagnosis not present

## 2020-08-23 DIAGNOSIS — D631 Anemia in chronic kidney disease: Secondary | ICD-10-CM | POA: Diagnosis not present

## 2020-08-23 DIAGNOSIS — J9622 Acute and chronic respiratory failure with hypercapnia: Secondary | ICD-10-CM | POA: Diagnosis not present

## 2020-08-23 DIAGNOSIS — R2681 Unsteadiness on feet: Secondary | ICD-10-CM | POA: Diagnosis not present

## 2020-08-23 DIAGNOSIS — Z7951 Long term (current) use of inhaled steroids: Secondary | ICD-10-CM | POA: Diagnosis not present

## 2020-08-26 DIAGNOSIS — Z7951 Long term (current) use of inhaled steroids: Secondary | ICD-10-CM | POA: Diagnosis not present

## 2020-08-26 DIAGNOSIS — R0902 Hypoxemia: Secondary | ICD-10-CM | POA: Diagnosis not present

## 2020-08-26 DIAGNOSIS — Z87891 Personal history of nicotine dependence: Secondary | ICD-10-CM | POA: Diagnosis not present

## 2020-08-26 DIAGNOSIS — R0600 Dyspnea, unspecified: Secondary | ICD-10-CM | POA: Diagnosis not present

## 2020-08-26 DIAGNOSIS — I272 Pulmonary hypertension, unspecified: Secondary | ICD-10-CM | POA: Diagnosis not present

## 2020-08-26 DIAGNOSIS — I10 Essential (primary) hypertension: Secondary | ICD-10-CM | POA: Diagnosis not present

## 2020-08-26 DIAGNOSIS — G4733 Obstructive sleep apnea (adult) (pediatric): Secondary | ICD-10-CM | POA: Diagnosis not present

## 2020-08-26 DIAGNOSIS — I071 Rheumatic tricuspid insufficiency: Secondary | ICD-10-CM | POA: Diagnosis not present

## 2020-08-26 DIAGNOSIS — Z7982 Long term (current) use of aspirin: Secondary | ICD-10-CM | POA: Diagnosis not present

## 2020-08-26 DIAGNOSIS — J449 Chronic obstructive pulmonary disease, unspecified: Secondary | ICD-10-CM | POA: Diagnosis not present

## 2020-08-26 DIAGNOSIS — R0602 Shortness of breath: Secondary | ICD-10-CM | POA: Diagnosis not present

## 2020-08-26 DIAGNOSIS — Z8616 Personal history of COVID-19: Secondary | ICD-10-CM | POA: Diagnosis not present

## 2020-08-30 DIAGNOSIS — H5203 Hypermetropia, bilateral: Secondary | ICD-10-CM | POA: Diagnosis not present

## 2020-08-30 DIAGNOSIS — H26493 Other secondary cataract, bilateral: Secondary | ICD-10-CM | POA: Diagnosis not present

## 2020-08-31 DIAGNOSIS — J9622 Acute and chronic respiratory failure with hypercapnia: Secondary | ICD-10-CM | POA: Diagnosis not present

## 2020-08-31 DIAGNOSIS — J9621 Acute and chronic respiratory failure with hypoxia: Secondary | ICD-10-CM | POA: Diagnosis not present

## 2020-08-31 DIAGNOSIS — Z9981 Dependence on supplemental oxygen: Secondary | ICD-10-CM | POA: Diagnosis not present

## 2020-08-31 DIAGNOSIS — Z96651 Presence of right artificial knee joint: Secondary | ICD-10-CM | POA: Diagnosis not present

## 2020-08-31 DIAGNOSIS — R531 Weakness: Secondary | ICD-10-CM | POA: Diagnosis not present

## 2020-08-31 DIAGNOSIS — Z87891 Personal history of nicotine dependence: Secondary | ICD-10-CM | POA: Diagnosis not present

## 2020-08-31 DIAGNOSIS — N179 Acute kidney failure, unspecified: Secondary | ICD-10-CM | POA: Diagnosis not present

## 2020-08-31 DIAGNOSIS — I272 Pulmonary hypertension, unspecified: Secondary | ICD-10-CM | POA: Diagnosis not present

## 2020-08-31 DIAGNOSIS — I129 Hypertensive chronic kidney disease with stage 1 through stage 4 chronic kidney disease, or unspecified chronic kidney disease: Secondary | ICD-10-CM | POA: Diagnosis not present

## 2020-08-31 DIAGNOSIS — Z7951 Long term (current) use of inhaled steroids: Secondary | ICD-10-CM | POA: Diagnosis not present

## 2020-08-31 DIAGNOSIS — J440 Chronic obstructive pulmonary disease with acute lower respiratory infection: Secondary | ICD-10-CM | POA: Diagnosis not present

## 2020-08-31 DIAGNOSIS — N183 Chronic kidney disease, stage 3 unspecified: Secondary | ICD-10-CM | POA: Diagnosis not present

## 2020-08-31 DIAGNOSIS — J1282 Pneumonia due to coronavirus disease 2019: Secondary | ICD-10-CM | POA: Diagnosis not present

## 2020-08-31 DIAGNOSIS — D631 Anemia in chronic kidney disease: Secondary | ICD-10-CM | POA: Diagnosis not present

## 2020-08-31 DIAGNOSIS — U071 COVID-19: Secondary | ICD-10-CM | POA: Diagnosis not present

## 2020-08-31 DIAGNOSIS — R2681 Unsteadiness on feet: Secondary | ICD-10-CM | POA: Diagnosis not present

## 2020-09-06 ENCOUNTER — Telehealth: Payer: Self-pay

## 2020-09-06 NOTE — Telephone Encounter (Signed)
Volunteer called patient on behalf of Palliative Care. Patient is doing well at this time.

## 2020-09-07 DIAGNOSIS — R2681 Unsteadiness on feet: Secondary | ICD-10-CM | POA: Diagnosis not present

## 2020-09-07 DIAGNOSIS — Z87891 Personal history of nicotine dependence: Secondary | ICD-10-CM | POA: Diagnosis not present

## 2020-09-07 DIAGNOSIS — J1282 Pneumonia due to coronavirus disease 2019: Secondary | ICD-10-CM | POA: Diagnosis not present

## 2020-09-07 DIAGNOSIS — U071 COVID-19: Secondary | ICD-10-CM | POA: Diagnosis not present

## 2020-09-07 DIAGNOSIS — Z9981 Dependence on supplemental oxygen: Secondary | ICD-10-CM | POA: Diagnosis not present

## 2020-09-07 DIAGNOSIS — D631 Anemia in chronic kidney disease: Secondary | ICD-10-CM | POA: Diagnosis not present

## 2020-09-07 DIAGNOSIS — N183 Chronic kidney disease, stage 3 unspecified: Secondary | ICD-10-CM | POA: Diagnosis not present

## 2020-09-07 DIAGNOSIS — J440 Chronic obstructive pulmonary disease with acute lower respiratory infection: Secondary | ICD-10-CM | POA: Diagnosis not present

## 2020-09-07 DIAGNOSIS — J9622 Acute and chronic respiratory failure with hypercapnia: Secondary | ICD-10-CM | POA: Diagnosis not present

## 2020-09-07 DIAGNOSIS — R531 Weakness: Secondary | ICD-10-CM | POA: Diagnosis not present

## 2020-09-07 DIAGNOSIS — Z96651 Presence of right artificial knee joint: Secondary | ICD-10-CM | POA: Diagnosis not present

## 2020-09-07 DIAGNOSIS — J9621 Acute and chronic respiratory failure with hypoxia: Secondary | ICD-10-CM | POA: Diagnosis not present

## 2020-09-07 DIAGNOSIS — Z7951 Long term (current) use of inhaled steroids: Secondary | ICD-10-CM | POA: Diagnosis not present

## 2020-09-07 DIAGNOSIS — I129 Hypertensive chronic kidney disease with stage 1 through stage 4 chronic kidney disease, or unspecified chronic kidney disease: Secondary | ICD-10-CM | POA: Diagnosis not present

## 2020-09-07 DIAGNOSIS — I272 Pulmonary hypertension, unspecified: Secondary | ICD-10-CM | POA: Diagnosis not present

## 2020-09-07 DIAGNOSIS — N179 Acute kidney failure, unspecified: Secondary | ICD-10-CM | POA: Diagnosis not present

## 2020-09-14 DIAGNOSIS — J440 Chronic obstructive pulmonary disease with acute lower respiratory infection: Secondary | ICD-10-CM | POA: Diagnosis not present

## 2020-09-14 DIAGNOSIS — J1282 Pneumonia due to coronavirus disease 2019: Secondary | ICD-10-CM | POA: Diagnosis not present

## 2020-09-14 DIAGNOSIS — Z87891 Personal history of nicotine dependence: Secondary | ICD-10-CM | POA: Diagnosis not present

## 2020-09-14 DIAGNOSIS — Z96651 Presence of right artificial knee joint: Secondary | ICD-10-CM | POA: Diagnosis not present

## 2020-09-14 DIAGNOSIS — D631 Anemia in chronic kidney disease: Secondary | ICD-10-CM | POA: Diagnosis not present

## 2020-09-14 DIAGNOSIS — Z7951 Long term (current) use of inhaled steroids: Secondary | ICD-10-CM | POA: Diagnosis not present

## 2020-09-14 DIAGNOSIS — Z9981 Dependence on supplemental oxygen: Secondary | ICD-10-CM | POA: Diagnosis not present

## 2020-09-14 DIAGNOSIS — N179 Acute kidney failure, unspecified: Secondary | ICD-10-CM | POA: Diagnosis not present

## 2020-09-14 DIAGNOSIS — U071 COVID-19: Secondary | ICD-10-CM | POA: Diagnosis not present

## 2020-09-14 DIAGNOSIS — I272 Pulmonary hypertension, unspecified: Secondary | ICD-10-CM | POA: Diagnosis not present

## 2020-09-14 DIAGNOSIS — R2681 Unsteadiness on feet: Secondary | ICD-10-CM | POA: Diagnosis not present

## 2020-09-14 DIAGNOSIS — R531 Weakness: Secondary | ICD-10-CM | POA: Diagnosis not present

## 2020-09-14 DIAGNOSIS — J9621 Acute and chronic respiratory failure with hypoxia: Secondary | ICD-10-CM | POA: Diagnosis not present

## 2020-09-14 DIAGNOSIS — I129 Hypertensive chronic kidney disease with stage 1 through stage 4 chronic kidney disease, or unspecified chronic kidney disease: Secondary | ICD-10-CM | POA: Diagnosis not present

## 2020-09-14 DIAGNOSIS — J9622 Acute and chronic respiratory failure with hypercapnia: Secondary | ICD-10-CM | POA: Diagnosis not present

## 2020-09-14 DIAGNOSIS — N183 Chronic kidney disease, stage 3 unspecified: Secondary | ICD-10-CM | POA: Diagnosis not present

## 2020-09-19 DIAGNOSIS — J449 Chronic obstructive pulmonary disease, unspecified: Secondary | ICD-10-CM | POA: Diagnosis not present

## 2020-09-23 DIAGNOSIS — J9622 Acute and chronic respiratory failure with hypercapnia: Secondary | ICD-10-CM | POA: Diagnosis not present

## 2020-09-23 DIAGNOSIS — I272 Pulmonary hypertension, unspecified: Secondary | ICD-10-CM | POA: Diagnosis not present

## 2020-09-23 DIAGNOSIS — I129 Hypertensive chronic kidney disease with stage 1 through stage 4 chronic kidney disease, or unspecified chronic kidney disease: Secondary | ICD-10-CM | POA: Diagnosis not present

## 2020-09-23 DIAGNOSIS — U071 COVID-19: Secondary | ICD-10-CM | POA: Diagnosis not present

## 2020-09-23 DIAGNOSIS — D631 Anemia in chronic kidney disease: Secondary | ICD-10-CM | POA: Diagnosis not present

## 2020-09-23 DIAGNOSIS — J9621 Acute and chronic respiratory failure with hypoxia: Secondary | ICD-10-CM | POA: Diagnosis not present

## 2020-09-23 DIAGNOSIS — Z96651 Presence of right artificial knee joint: Secondary | ICD-10-CM | POA: Diagnosis not present

## 2020-09-23 DIAGNOSIS — N183 Chronic kidney disease, stage 3 unspecified: Secondary | ICD-10-CM | POA: Diagnosis not present

## 2020-09-23 DIAGNOSIS — Z7951 Long term (current) use of inhaled steroids: Secondary | ICD-10-CM | POA: Diagnosis not present

## 2020-09-23 DIAGNOSIS — N179 Acute kidney failure, unspecified: Secondary | ICD-10-CM | POA: Diagnosis not present

## 2020-09-23 DIAGNOSIS — Z87891 Personal history of nicotine dependence: Secondary | ICD-10-CM | POA: Diagnosis not present

## 2020-09-23 DIAGNOSIS — J1282 Pneumonia due to coronavirus disease 2019: Secondary | ICD-10-CM | POA: Diagnosis not present

## 2020-09-23 DIAGNOSIS — J440 Chronic obstructive pulmonary disease with acute lower respiratory infection: Secondary | ICD-10-CM | POA: Diagnosis not present

## 2020-09-23 DIAGNOSIS — Z9981 Dependence on supplemental oxygen: Secondary | ICD-10-CM | POA: Diagnosis not present

## 2020-09-23 DIAGNOSIS — R2681 Unsteadiness on feet: Secondary | ICD-10-CM | POA: Diagnosis not present

## 2020-09-23 DIAGNOSIS — R531 Weakness: Secondary | ICD-10-CM | POA: Diagnosis not present

## 2020-10-05 DIAGNOSIS — G4733 Obstructive sleep apnea (adult) (pediatric): Secondary | ICD-10-CM | POA: Diagnosis not present

## 2020-10-11 DIAGNOSIS — I2721 Secondary pulmonary arterial hypertension: Secondary | ICD-10-CM | POA: Diagnosis not present

## 2020-10-11 DIAGNOSIS — J449 Chronic obstructive pulmonary disease, unspecified: Secondary | ICD-10-CM | POA: Diagnosis not present

## 2020-10-11 DIAGNOSIS — R0602 Shortness of breath: Secondary | ICD-10-CM | POA: Diagnosis not present

## 2020-10-11 DIAGNOSIS — Z9981 Dependence on supplemental oxygen: Secondary | ICD-10-CM | POA: Diagnosis not present

## 2020-10-11 DIAGNOSIS — U071 COVID-19: Secondary | ICD-10-CM | POA: Diagnosis not present

## 2020-10-11 DIAGNOSIS — G4733 Obstructive sleep apnea (adult) (pediatric): Secondary | ICD-10-CM | POA: Diagnosis not present

## 2020-10-11 DIAGNOSIS — R0902 Hypoxemia: Secondary | ICD-10-CM | POA: Diagnosis not present

## 2020-10-11 DIAGNOSIS — I272 Pulmonary hypertension, unspecified: Secondary | ICD-10-CM | POA: Diagnosis not present

## 2020-10-18 DIAGNOSIS — E782 Mixed hyperlipidemia: Secondary | ICD-10-CM | POA: Diagnosis not present

## 2020-10-20 DIAGNOSIS — J449 Chronic obstructive pulmonary disease, unspecified: Secondary | ICD-10-CM | POA: Diagnosis not present

## 2020-10-24 DIAGNOSIS — Z139 Encounter for screening, unspecified: Secondary | ICD-10-CM | POA: Diagnosis not present

## 2020-10-24 DIAGNOSIS — J449 Chronic obstructive pulmonary disease, unspecified: Secondary | ICD-10-CM | POA: Diagnosis not present

## 2020-10-24 DIAGNOSIS — Z136 Encounter for screening for cardiovascular disorders: Secondary | ICD-10-CM | POA: Diagnosis not present

## 2020-10-24 DIAGNOSIS — I50811 Acute right heart failure: Secondary | ICD-10-CM | POA: Diagnosis not present

## 2020-10-24 DIAGNOSIS — Z0000 Encounter for general adult medical examination without abnormal findings: Secondary | ICD-10-CM | POA: Diagnosis not present

## 2020-10-24 DIAGNOSIS — E782 Mixed hyperlipidemia: Secondary | ICD-10-CM | POA: Diagnosis not present

## 2020-10-24 DIAGNOSIS — N1831 Chronic kidney disease, stage 3a: Secondary | ICD-10-CM | POA: Diagnosis not present

## 2020-11-19 DIAGNOSIS — J449 Chronic obstructive pulmonary disease, unspecified: Secondary | ICD-10-CM | POA: Diagnosis not present

## 2020-11-30 ENCOUNTER — Telehealth: Payer: Self-pay

## 2020-11-30 NOTE — Telephone Encounter (Signed)
Volunteer called patient on behalf of [facility] and did not get a answer from patient/family.

## 2020-12-02 DIAGNOSIS — I2721 Secondary pulmonary arterial hypertension: Secondary | ICD-10-CM | POA: Diagnosis not present

## 2020-12-02 DIAGNOSIS — J449 Chronic obstructive pulmonary disease, unspecified: Secondary | ICD-10-CM | POA: Diagnosis not present

## 2020-12-02 DIAGNOSIS — R0902 Hypoxemia: Secondary | ICD-10-CM | POA: Diagnosis not present

## 2020-12-02 DIAGNOSIS — Z9981 Dependence on supplemental oxygen: Secondary | ICD-10-CM | POA: Diagnosis not present

## 2020-12-02 DIAGNOSIS — I272 Pulmonary hypertension, unspecified: Secondary | ICD-10-CM | POA: Diagnosis not present

## 2020-12-02 DIAGNOSIS — G4733 Obstructive sleep apnea (adult) (pediatric): Secondary | ICD-10-CM | POA: Diagnosis not present

## 2020-12-02 DIAGNOSIS — R0602 Shortness of breath: Secondary | ICD-10-CM | POA: Diagnosis not present

## 2020-12-20 DIAGNOSIS — J449 Chronic obstructive pulmonary disease, unspecified: Secondary | ICD-10-CM | POA: Diagnosis not present

## 2021-01-01 DIAGNOSIS — N179 Acute kidney failure, unspecified: Secondary | ICD-10-CM | POA: Diagnosis not present

## 2021-01-01 DIAGNOSIS — Z9989 Dependence on other enabling machines and devices: Secondary | ICD-10-CM | POA: Diagnosis not present

## 2021-01-01 DIAGNOSIS — E876 Hypokalemia: Secondary | ICD-10-CM | POA: Diagnosis not present

## 2021-01-01 DIAGNOSIS — Z87891 Personal history of nicotine dependence: Secondary | ICD-10-CM | POA: Diagnosis not present

## 2021-01-01 DIAGNOSIS — I10 Essential (primary) hypertension: Secondary | ICD-10-CM | POA: Diagnosis not present

## 2021-01-01 DIAGNOSIS — R0602 Shortness of breath: Secondary | ICD-10-CM | POA: Diagnosis not present

## 2021-01-01 DIAGNOSIS — I270 Primary pulmonary hypertension: Secondary | ICD-10-CM | POA: Diagnosis not present

## 2021-01-01 DIAGNOSIS — G4733 Obstructive sleep apnea (adult) (pediatric): Secondary | ICD-10-CM | POA: Diagnosis not present

## 2021-01-01 DIAGNOSIS — D631 Anemia in chronic kidney disease: Secondary | ICD-10-CM | POA: Diagnosis not present

## 2021-01-01 DIAGNOSIS — E872 Acidosis: Secondary | ICD-10-CM | POA: Diagnosis not present

## 2021-01-01 DIAGNOSIS — J811 Chronic pulmonary edema: Secondary | ICD-10-CM | POA: Diagnosis not present

## 2021-01-01 DIAGNOSIS — J9611 Chronic respiratory failure with hypoxia: Secondary | ICD-10-CM | POA: Diagnosis not present

## 2021-01-01 DIAGNOSIS — N183 Chronic kidney disease, stage 3 unspecified: Secondary | ICD-10-CM | POA: Diagnosis not present

## 2021-01-01 DIAGNOSIS — J9622 Acute and chronic respiratory failure with hypercapnia: Secondary | ICD-10-CM | POA: Diagnosis not present

## 2021-01-01 DIAGNOSIS — D500 Iron deficiency anemia secondary to blood loss (chronic): Secondary | ICD-10-CM | POA: Diagnosis not present

## 2021-01-01 DIAGNOSIS — J441 Chronic obstructive pulmonary disease with (acute) exacerbation: Secondary | ICD-10-CM | POA: Diagnosis not present

## 2021-01-01 DIAGNOSIS — J9610 Chronic respiratory failure, unspecified whether with hypoxia or hypercapnia: Secondary | ICD-10-CM | POA: Diagnosis not present

## 2021-01-01 DIAGNOSIS — I272 Pulmonary hypertension, unspecified: Secondary | ICD-10-CM | POA: Diagnosis not present

## 2021-01-01 DIAGNOSIS — Z7982 Long term (current) use of aspirin: Secondary | ICD-10-CM | POA: Diagnosis not present

## 2021-01-01 DIAGNOSIS — Z96641 Presence of right artificial hip joint: Secondary | ICD-10-CM | POA: Diagnosis not present

## 2021-01-01 DIAGNOSIS — Z09 Encounter for follow-up examination after completed treatment for conditions other than malignant neoplasm: Secondary | ICD-10-CM | POA: Diagnosis not present

## 2021-01-01 DIAGNOSIS — J810 Acute pulmonary edema: Secondary | ICD-10-CM | POA: Diagnosis not present

## 2021-01-01 DIAGNOSIS — N1831 Chronic kidney disease, stage 3a: Secondary | ICD-10-CM | POA: Diagnosis not present

## 2021-01-01 DIAGNOSIS — Z20822 Contact with and (suspected) exposure to covid-19: Secondary | ICD-10-CM | POA: Diagnosis not present

## 2021-01-01 DIAGNOSIS — R0600 Dyspnea, unspecified: Secondary | ICD-10-CM | POA: Diagnosis not present

## 2021-01-01 DIAGNOSIS — K5900 Constipation, unspecified: Secondary | ICD-10-CM | POA: Diagnosis not present

## 2021-01-01 DIAGNOSIS — E1122 Type 2 diabetes mellitus with diabetic chronic kidney disease: Secondary | ICD-10-CM | POA: Diagnosis not present

## 2021-01-01 DIAGNOSIS — D649 Anemia, unspecified: Secondary | ICD-10-CM | POA: Diagnosis not present

## 2021-01-01 DIAGNOSIS — R1013 Epigastric pain: Secondary | ICD-10-CM | POA: Diagnosis not present

## 2021-01-01 DIAGNOSIS — Z79899 Other long term (current) drug therapy: Secondary | ICD-10-CM | POA: Diagnosis not present

## 2021-01-01 DIAGNOSIS — Z8616 Personal history of COVID-19: Secondary | ICD-10-CM | POA: Diagnosis not present

## 2021-01-01 DIAGNOSIS — Z9981 Dependence on supplemental oxygen: Secondary | ICD-10-CM | POA: Diagnosis not present

## 2021-01-01 DIAGNOSIS — J449 Chronic obstructive pulmonary disease, unspecified: Secondary | ICD-10-CM | POA: Diagnosis not present

## 2021-01-01 DIAGNOSIS — I129 Hypertensive chronic kidney disease with stage 1 through stage 4 chronic kidney disease, or unspecified chronic kidney disease: Secondary | ICD-10-CM | POA: Diagnosis not present

## 2021-01-01 DIAGNOSIS — J439 Emphysema, unspecified: Secondary | ICD-10-CM | POA: Diagnosis not present

## 2021-01-01 DIAGNOSIS — I471 Supraventricular tachycardia: Secondary | ICD-10-CM | POA: Diagnosis not present

## 2021-01-01 DIAGNOSIS — J9612 Chronic respiratory failure with hypercapnia: Secondary | ICD-10-CM | POA: Diagnosis not present

## 2021-01-01 DIAGNOSIS — K219 Gastro-esophageal reflux disease without esophagitis: Secondary | ICD-10-CM | POA: Diagnosis not present

## 2021-01-02 DIAGNOSIS — R0600 Dyspnea, unspecified: Secondary | ICD-10-CM | POA: Diagnosis not present

## 2021-01-02 DIAGNOSIS — I10 Essential (primary) hypertension: Secondary | ICD-10-CM | POA: Diagnosis not present

## 2021-01-02 DIAGNOSIS — J449 Chronic obstructive pulmonary disease, unspecified: Secondary | ICD-10-CM | POA: Diagnosis not present

## 2021-01-02 DIAGNOSIS — I272 Pulmonary hypertension, unspecified: Secondary | ICD-10-CM | POA: Diagnosis not present

## 2021-01-02 DIAGNOSIS — J9622 Acute and chronic respiratory failure with hypercapnia: Secondary | ICD-10-CM | POA: Diagnosis not present

## 2021-01-03 DIAGNOSIS — D649 Anemia, unspecified: Secondary | ICD-10-CM | POA: Diagnosis not present

## 2021-01-03 DIAGNOSIS — I272 Pulmonary hypertension, unspecified: Secondary | ICD-10-CM | POA: Diagnosis not present

## 2021-01-03 DIAGNOSIS — J9611 Chronic respiratory failure with hypoxia: Secondary | ICD-10-CM | POA: Diagnosis not present

## 2021-01-03 DIAGNOSIS — J9622 Acute and chronic respiratory failure with hypercapnia: Secondary | ICD-10-CM | POA: Diagnosis not present

## 2021-01-03 DIAGNOSIS — J449 Chronic obstructive pulmonary disease, unspecified: Secondary | ICD-10-CM | POA: Diagnosis not present

## 2021-01-03 DIAGNOSIS — N1831 Chronic kidney disease, stage 3a: Secondary | ICD-10-CM | POA: Diagnosis not present

## 2021-01-03 DIAGNOSIS — G4733 Obstructive sleep apnea (adult) (pediatric): Secondary | ICD-10-CM | POA: Diagnosis not present

## 2021-01-04 DIAGNOSIS — J9622 Acute and chronic respiratory failure with hypercapnia: Secondary | ICD-10-CM | POA: Diagnosis not present

## 2021-01-04 DIAGNOSIS — I272 Pulmonary hypertension, unspecified: Secondary | ICD-10-CM | POA: Diagnosis not present

## 2021-01-04 DIAGNOSIS — J449 Chronic obstructive pulmonary disease, unspecified: Secondary | ICD-10-CM | POA: Diagnosis not present

## 2021-01-04 DIAGNOSIS — N1831 Chronic kidney disease, stage 3a: Secondary | ICD-10-CM | POA: Diagnosis not present

## 2021-01-04 DIAGNOSIS — D649 Anemia, unspecified: Secondary | ICD-10-CM | POA: Diagnosis not present

## 2021-01-05 DIAGNOSIS — I272 Pulmonary hypertension, unspecified: Secondary | ICD-10-CM | POA: Diagnosis not present

## 2021-01-05 DIAGNOSIS — J9622 Acute and chronic respiratory failure with hypercapnia: Secondary | ICD-10-CM | POA: Diagnosis not present

## 2021-01-05 DIAGNOSIS — G4733 Obstructive sleep apnea (adult) (pediatric): Secondary | ICD-10-CM | POA: Diagnosis not present

## 2021-01-05 DIAGNOSIS — K219 Gastro-esophageal reflux disease without esophagitis: Secondary | ICD-10-CM | POA: Diagnosis not present

## 2021-01-05 DIAGNOSIS — J449 Chronic obstructive pulmonary disease, unspecified: Secondary | ICD-10-CM | POA: Diagnosis not present

## 2021-01-05 DIAGNOSIS — I10 Essential (primary) hypertension: Secondary | ICD-10-CM | POA: Diagnosis not present

## 2021-01-05 DIAGNOSIS — J9611 Chronic respiratory failure with hypoxia: Secondary | ICD-10-CM | POA: Diagnosis not present

## 2021-01-06 DIAGNOSIS — E876 Hypokalemia: Secondary | ICD-10-CM | POA: Diagnosis not present

## 2021-01-06 DIAGNOSIS — J449 Chronic obstructive pulmonary disease, unspecified: Secondary | ICD-10-CM | POA: Diagnosis not present

## 2021-01-06 DIAGNOSIS — I272 Pulmonary hypertension, unspecified: Secondary | ICD-10-CM | POA: Diagnosis not present

## 2021-01-06 DIAGNOSIS — J9622 Acute and chronic respiratory failure with hypercapnia: Secondary | ICD-10-CM | POA: Diagnosis not present

## 2021-01-06 DIAGNOSIS — N179 Acute kidney failure, unspecified: Secondary | ICD-10-CM | POA: Diagnosis not present

## 2021-01-07 DIAGNOSIS — I272 Pulmonary hypertension, unspecified: Secondary | ICD-10-CM | POA: Diagnosis not present

## 2021-01-07 DIAGNOSIS — N1831 Chronic kidney disease, stage 3a: Secondary | ICD-10-CM | POA: Diagnosis not present

## 2021-01-07 DIAGNOSIS — N179 Acute kidney failure, unspecified: Secondary | ICD-10-CM | POA: Diagnosis not present

## 2021-01-07 DIAGNOSIS — J9622 Acute and chronic respiratory failure with hypercapnia: Secondary | ICD-10-CM | POA: Diagnosis not present

## 2021-01-07 DIAGNOSIS — J449 Chronic obstructive pulmonary disease, unspecified: Secondary | ICD-10-CM | POA: Diagnosis not present

## 2021-01-08 DIAGNOSIS — R0600 Dyspnea, unspecified: Secondary | ICD-10-CM | POA: Diagnosis not present

## 2021-01-08 DIAGNOSIS — N183 Chronic kidney disease, stage 3 unspecified: Secondary | ICD-10-CM | POA: Diagnosis not present

## 2021-01-08 DIAGNOSIS — J9622 Acute and chronic respiratory failure with hypercapnia: Secondary | ICD-10-CM | POA: Diagnosis not present

## 2021-01-08 DIAGNOSIS — N179 Acute kidney failure, unspecified: Secondary | ICD-10-CM | POA: Diagnosis not present

## 2021-01-08 DIAGNOSIS — J449 Chronic obstructive pulmonary disease, unspecified: Secondary | ICD-10-CM | POA: Diagnosis not present

## 2021-01-09 DIAGNOSIS — J9622 Acute and chronic respiratory failure with hypercapnia: Secondary | ICD-10-CM | POA: Diagnosis not present

## 2021-01-09 DIAGNOSIS — I272 Pulmonary hypertension, unspecified: Secondary | ICD-10-CM | POA: Diagnosis not present

## 2021-01-09 DIAGNOSIS — N183 Chronic kidney disease, stage 3 unspecified: Secondary | ICD-10-CM | POA: Diagnosis not present

## 2021-01-09 DIAGNOSIS — J449 Chronic obstructive pulmonary disease, unspecified: Secondary | ICD-10-CM | POA: Diagnosis not present

## 2021-01-09 DIAGNOSIS — N179 Acute kidney failure, unspecified: Secondary | ICD-10-CM | POA: Diagnosis not present

## 2021-01-10 DIAGNOSIS — I270 Primary pulmonary hypertension: Secondary | ICD-10-CM | POA: Diagnosis not present

## 2021-01-10 DIAGNOSIS — J9622 Acute and chronic respiratory failure with hypercapnia: Secondary | ICD-10-CM | POA: Diagnosis not present

## 2021-01-10 DIAGNOSIS — J449 Chronic obstructive pulmonary disease, unspecified: Secondary | ICD-10-CM | POA: Diagnosis not present

## 2021-01-10 DIAGNOSIS — N183 Chronic kidney disease, stage 3 unspecified: Secondary | ICD-10-CM | POA: Diagnosis not present

## 2021-01-10 DIAGNOSIS — D631 Anemia in chronic kidney disease: Secondary | ICD-10-CM | POA: Diagnosis not present

## 2021-01-30 DIAGNOSIS — Z9981 Dependence on supplemental oxygen: Secondary | ICD-10-CM | POA: Diagnosis not present

## 2021-01-30 DIAGNOSIS — J439 Emphysema, unspecified: Secondary | ICD-10-CM | POA: Diagnosis not present

## 2021-01-30 DIAGNOSIS — D631 Anemia in chronic kidney disease: Secondary | ICD-10-CM | POA: Diagnosis not present

## 2021-01-30 DIAGNOSIS — Z87891 Personal history of nicotine dependence: Secondary | ICD-10-CM | POA: Diagnosis not present

## 2021-01-30 DIAGNOSIS — G4733 Obstructive sleep apnea (adult) (pediatric): Secondary | ICD-10-CM | POA: Diagnosis not present

## 2021-01-30 DIAGNOSIS — K219 Gastro-esophageal reflux disease without esophagitis: Secondary | ICD-10-CM | POA: Diagnosis not present

## 2021-01-30 DIAGNOSIS — I456 Pre-excitation syndrome: Secondary | ICD-10-CM | POA: Diagnosis not present

## 2021-01-30 DIAGNOSIS — Z8616 Personal history of COVID-19: Secondary | ICD-10-CM | POA: Diagnosis not present

## 2021-01-30 DIAGNOSIS — N183 Chronic kidney disease, stage 3 unspecified: Secondary | ICD-10-CM | POA: Diagnosis not present

## 2021-01-30 DIAGNOSIS — I272 Pulmonary hypertension, unspecified: Secondary | ICD-10-CM | POA: Diagnosis not present

## 2021-01-30 DIAGNOSIS — I129 Hypertensive chronic kidney disease with stage 1 through stage 4 chronic kidney disease, or unspecified chronic kidney disease: Secondary | ICD-10-CM | POA: Diagnosis not present

## 2021-01-30 DIAGNOSIS — I471 Supraventricular tachycardia: Secondary | ICD-10-CM | POA: Diagnosis not present

## 2021-02-02 DIAGNOSIS — I270 Primary pulmonary hypertension: Secondary | ICD-10-CM | POA: Diagnosis not present

## 2021-02-02 DIAGNOSIS — J449 Chronic obstructive pulmonary disease, unspecified: Secondary | ICD-10-CM | POA: Diagnosis not present

## 2021-02-02 DIAGNOSIS — I50811 Acute right heart failure: Secondary | ICD-10-CM | POA: Diagnosis not present

## 2021-02-02 DIAGNOSIS — J9610 Chronic respiratory failure, unspecified whether with hypoxia or hypercapnia: Secondary | ICD-10-CM | POA: Diagnosis not present

## 2021-02-02 DIAGNOSIS — Z7189 Other specified counseling: Secondary | ICD-10-CM | POA: Diagnosis not present

## 2021-02-03 DIAGNOSIS — J439 Emphysema, unspecified: Secondary | ICD-10-CM | POA: Diagnosis not present

## 2021-02-03 DIAGNOSIS — I129 Hypertensive chronic kidney disease with stage 1 through stage 4 chronic kidney disease, or unspecified chronic kidney disease: Secondary | ICD-10-CM | POA: Diagnosis not present

## 2021-02-03 DIAGNOSIS — Z8616 Personal history of COVID-19: Secondary | ICD-10-CM | POA: Diagnosis not present

## 2021-02-03 DIAGNOSIS — I456 Pre-excitation syndrome: Secondary | ICD-10-CM | POA: Diagnosis not present

## 2021-02-03 DIAGNOSIS — K219 Gastro-esophageal reflux disease without esophagitis: Secondary | ICD-10-CM | POA: Diagnosis not present

## 2021-02-03 DIAGNOSIS — N183 Chronic kidney disease, stage 3 unspecified: Secondary | ICD-10-CM | POA: Diagnosis not present

## 2021-02-03 DIAGNOSIS — I471 Supraventricular tachycardia: Secondary | ICD-10-CM | POA: Diagnosis not present

## 2021-02-03 DIAGNOSIS — D631 Anemia in chronic kidney disease: Secondary | ICD-10-CM | POA: Diagnosis not present

## 2021-02-03 DIAGNOSIS — I272 Pulmonary hypertension, unspecified: Secondary | ICD-10-CM | POA: Diagnosis not present

## 2021-02-03 DIAGNOSIS — G4733 Obstructive sleep apnea (adult) (pediatric): Secondary | ICD-10-CM | POA: Diagnosis not present

## 2021-02-03 DIAGNOSIS — Z9981 Dependence on supplemental oxygen: Secondary | ICD-10-CM | POA: Diagnosis not present

## 2021-02-03 DIAGNOSIS — Z87891 Personal history of nicotine dependence: Secondary | ICD-10-CM | POA: Diagnosis not present

## 2021-02-08 DIAGNOSIS — R079 Chest pain, unspecified: Secondary | ICD-10-CM | POA: Diagnosis not present

## 2021-02-08 DIAGNOSIS — R6889 Other general symptoms and signs: Secondary | ICD-10-CM | POA: Diagnosis not present

## 2021-02-08 DIAGNOSIS — T50995A Adverse effect of other drugs, medicaments and biological substances, initial encounter: Secondary | ICD-10-CM | POA: Diagnosis not present

## 2021-02-08 DIAGNOSIS — Z7951 Long term (current) use of inhaled steroids: Secondary | ICD-10-CM | POA: Diagnosis not present

## 2021-02-08 DIAGNOSIS — M879 Osteonecrosis, unspecified: Secondary | ICD-10-CM | POA: Diagnosis not present

## 2021-02-08 DIAGNOSIS — T887XXA Unspecified adverse effect of drug or medicament, initial encounter: Secondary | ICD-10-CM | POA: Diagnosis not present

## 2021-02-08 DIAGNOSIS — R0602 Shortness of breath: Secondary | ICD-10-CM | POA: Diagnosis not present

## 2021-02-08 DIAGNOSIS — Z79899 Other long term (current) drug therapy: Secondary | ICD-10-CM | POA: Diagnosis not present

## 2021-02-08 DIAGNOSIS — I272 Pulmonary hypertension, unspecified: Secondary | ICD-10-CM | POA: Diagnosis not present

## 2021-02-08 DIAGNOSIS — Z87891 Personal history of nicotine dependence: Secondary | ICD-10-CM | POA: Diagnosis not present

## 2021-02-08 DIAGNOSIS — I10 Essential (primary) hypertension: Secondary | ICD-10-CM | POA: Diagnosis not present

## 2021-02-08 DIAGNOSIS — R55 Syncope and collapse: Secondary | ICD-10-CM | POA: Diagnosis not present

## 2021-02-08 DIAGNOSIS — Z20822 Contact with and (suspected) exposure to covid-19: Secondary | ICD-10-CM | POA: Diagnosis not present

## 2021-02-08 DIAGNOSIS — J9612 Chronic respiratory failure with hypercapnia: Secondary | ICD-10-CM | POA: Diagnosis not present

## 2021-02-08 DIAGNOSIS — J439 Emphysema, unspecified: Secondary | ICD-10-CM | POA: Diagnosis not present

## 2021-02-08 DIAGNOSIS — Z9981 Dependence on supplemental oxygen: Secondary | ICD-10-CM | POA: Diagnosis not present

## 2021-02-08 DIAGNOSIS — Z743 Need for continuous supervision: Secondary | ICD-10-CM | POA: Diagnosis not present

## 2021-02-08 DIAGNOSIS — T50905A Adverse effect of unspecified drugs, medicaments and biological substances, initial encounter: Secondary | ICD-10-CM | POA: Diagnosis not present

## 2021-02-10 DIAGNOSIS — I071 Rheumatic tricuspid insufficiency: Secondary | ICD-10-CM | POA: Diagnosis not present

## 2021-02-10 DIAGNOSIS — R0600 Dyspnea, unspecified: Secondary | ICD-10-CM | POA: Diagnosis not present

## 2021-02-10 DIAGNOSIS — I272 Pulmonary hypertension, unspecified: Secondary | ICD-10-CM | POA: Diagnosis not present

## 2021-02-10 DIAGNOSIS — R079 Chest pain, unspecified: Secondary | ICD-10-CM | POA: Diagnosis not present

## 2021-02-19 DIAGNOSIS — J449 Chronic obstructive pulmonary disease, unspecified: Secondary | ICD-10-CM | POA: Diagnosis not present

## 2021-02-20 DIAGNOSIS — D631 Anemia in chronic kidney disease: Secondary | ICD-10-CM | POA: Diagnosis not present

## 2021-02-20 DIAGNOSIS — Z87891 Personal history of nicotine dependence: Secondary | ICD-10-CM | POA: Diagnosis not present

## 2021-02-20 DIAGNOSIS — I129 Hypertensive chronic kidney disease with stage 1 through stage 4 chronic kidney disease, or unspecified chronic kidney disease: Secondary | ICD-10-CM | POA: Diagnosis not present

## 2021-02-20 DIAGNOSIS — Z9981 Dependence on supplemental oxygen: Secondary | ICD-10-CM | POA: Diagnosis not present

## 2021-02-20 DIAGNOSIS — I272 Pulmonary hypertension, unspecified: Secondary | ICD-10-CM | POA: Diagnosis not present

## 2021-02-20 DIAGNOSIS — J439 Emphysema, unspecified: Secondary | ICD-10-CM | POA: Diagnosis not present

## 2021-02-20 DIAGNOSIS — G4733 Obstructive sleep apnea (adult) (pediatric): Secondary | ICD-10-CM | POA: Diagnosis not present

## 2021-02-20 DIAGNOSIS — Z8616 Personal history of COVID-19: Secondary | ICD-10-CM | POA: Diagnosis not present

## 2021-02-20 DIAGNOSIS — N183 Chronic kidney disease, stage 3 unspecified: Secondary | ICD-10-CM | POA: Diagnosis not present

## 2021-02-20 DIAGNOSIS — I471 Supraventricular tachycardia: Secondary | ICD-10-CM | POA: Diagnosis not present

## 2021-02-20 DIAGNOSIS — K219 Gastro-esophageal reflux disease without esophagitis: Secondary | ICD-10-CM | POA: Diagnosis not present

## 2021-02-20 DIAGNOSIS — I456 Pre-excitation syndrome: Secondary | ICD-10-CM | POA: Diagnosis not present

## 2021-02-25 DIAGNOSIS — J449 Chronic obstructive pulmonary disease, unspecified: Secondary | ICD-10-CM | POA: Diagnosis not present

## 2021-02-25 DIAGNOSIS — J9610 Chronic respiratory failure, unspecified whether with hypoxia or hypercapnia: Secondary | ICD-10-CM | POA: Diagnosis not present

## 2021-03-03 DIAGNOSIS — R0600 Dyspnea, unspecified: Secondary | ICD-10-CM | POA: Diagnosis not present

## 2021-03-03 DIAGNOSIS — J449 Chronic obstructive pulmonary disease, unspecified: Secondary | ICD-10-CM | POA: Diagnosis not present

## 2021-03-03 DIAGNOSIS — I2721 Secondary pulmonary arterial hypertension: Secondary | ICD-10-CM | POA: Diagnosis not present

## 2021-03-03 DIAGNOSIS — G4733 Obstructive sleep apnea (adult) (pediatric): Secondary | ICD-10-CM | POA: Diagnosis not present

## 2021-03-03 DIAGNOSIS — R0902 Hypoxemia: Secondary | ICD-10-CM | POA: Diagnosis not present

## 2021-03-03 DIAGNOSIS — I272 Pulmonary hypertension, unspecified: Secondary | ICD-10-CM | POA: Diagnosis not present

## 2021-03-03 DIAGNOSIS — Z9981 Dependence on supplemental oxygen: Secondary | ICD-10-CM | POA: Diagnosis not present

## 2021-03-14 DIAGNOSIS — R079 Chest pain, unspecified: Secondary | ICD-10-CM | POA: Diagnosis not present

## 2021-03-14 DIAGNOSIS — E785 Hyperlipidemia, unspecified: Secondary | ICD-10-CM | POA: Diagnosis not present

## 2021-03-22 DIAGNOSIS — J449 Chronic obstructive pulmonary disease, unspecified: Secondary | ICD-10-CM | POA: Diagnosis not present

## 2021-03-28 DIAGNOSIS — J449 Chronic obstructive pulmonary disease, unspecified: Secondary | ICD-10-CM | POA: Diagnosis not present

## 2021-03-28 DIAGNOSIS — J9610 Chronic respiratory failure, unspecified whether with hypoxia or hypercapnia: Secondary | ICD-10-CM | POA: Diagnosis not present

## 2021-03-31 DIAGNOSIS — J449 Chronic obstructive pulmonary disease, unspecified: Secondary | ICD-10-CM | POA: Diagnosis not present

## 2021-04-04 DIAGNOSIS — E782 Mixed hyperlipidemia: Secondary | ICD-10-CM | POA: Diagnosis not present

## 2021-04-13 DIAGNOSIS — J9610 Chronic respiratory failure, unspecified whether with hypoxia or hypercapnia: Secondary | ICD-10-CM | POA: Diagnosis not present

## 2021-04-13 DIAGNOSIS — J449 Chronic obstructive pulmonary disease, unspecified: Secondary | ICD-10-CM | POA: Diagnosis not present

## 2021-04-13 DIAGNOSIS — E782 Mixed hyperlipidemia: Secondary | ICD-10-CM | POA: Diagnosis not present

## 2021-04-13 DIAGNOSIS — R7301 Impaired fasting glucose: Secondary | ICD-10-CM | POA: Diagnosis not present

## 2021-04-21 DIAGNOSIS — J449 Chronic obstructive pulmonary disease, unspecified: Secondary | ICD-10-CM | POA: Diagnosis not present

## 2021-04-27 DIAGNOSIS — J9610 Chronic respiratory failure, unspecified whether with hypoxia or hypercapnia: Secondary | ICD-10-CM | POA: Diagnosis not present

## 2021-04-27 DIAGNOSIS — J449 Chronic obstructive pulmonary disease, unspecified: Secondary | ICD-10-CM | POA: Diagnosis not present

## 2021-05-22 DIAGNOSIS — J449 Chronic obstructive pulmonary disease, unspecified: Secondary | ICD-10-CM | POA: Diagnosis not present

## 2021-05-28 DIAGNOSIS — J9610 Chronic respiratory failure, unspecified whether with hypoxia or hypercapnia: Secondary | ICD-10-CM | POA: Diagnosis not present

## 2021-05-28 DIAGNOSIS — J449 Chronic obstructive pulmonary disease, unspecified: Secondary | ICD-10-CM | POA: Diagnosis not present

## 2021-06-21 DIAGNOSIS — J449 Chronic obstructive pulmonary disease, unspecified: Secondary | ICD-10-CM | POA: Diagnosis not present

## 2021-06-27 DIAGNOSIS — J449 Chronic obstructive pulmonary disease, unspecified: Secondary | ICD-10-CM | POA: Diagnosis not present

## 2021-06-27 DIAGNOSIS — J9610 Chronic respiratory failure, unspecified whether with hypoxia or hypercapnia: Secondary | ICD-10-CM | POA: Diagnosis not present

## 2021-07-21 DIAGNOSIS — Z23 Encounter for immunization: Secondary | ICD-10-CM | POA: Diagnosis not present

## 2021-07-21 DIAGNOSIS — R0902 Hypoxemia: Secondary | ICD-10-CM | POA: Diagnosis not present

## 2021-07-21 DIAGNOSIS — Z9981 Dependence on supplemental oxygen: Secondary | ICD-10-CM | POA: Diagnosis not present

## 2021-07-21 DIAGNOSIS — G4733 Obstructive sleep apnea (adult) (pediatric): Secondary | ICD-10-CM | POA: Diagnosis not present

## 2021-07-21 DIAGNOSIS — R0600 Dyspnea, unspecified: Secondary | ICD-10-CM | POA: Diagnosis not present

## 2021-07-21 DIAGNOSIS — I2721 Secondary pulmonary arterial hypertension: Secondary | ICD-10-CM | POA: Diagnosis not present

## 2021-07-21 DIAGNOSIS — I272 Pulmonary hypertension, unspecified: Secondary | ICD-10-CM | POA: Diagnosis not present

## 2021-07-21 DIAGNOSIS — J449 Chronic obstructive pulmonary disease, unspecified: Secondary | ICD-10-CM | POA: Diagnosis not present

## 2021-07-22 DIAGNOSIS — J449 Chronic obstructive pulmonary disease, unspecified: Secondary | ICD-10-CM | POA: Diagnosis not present

## 2021-07-25 DIAGNOSIS — I272 Pulmonary hypertension, unspecified: Secondary | ICD-10-CM | POA: Diagnosis not present

## 2021-07-25 DIAGNOSIS — Z7951 Long term (current) use of inhaled steroids: Secondary | ICD-10-CM | POA: Diagnosis not present

## 2021-07-25 DIAGNOSIS — R0600 Dyspnea, unspecified: Secondary | ICD-10-CM | POA: Diagnosis not present

## 2021-07-25 DIAGNOSIS — I10 Essential (primary) hypertension: Secondary | ICD-10-CM | POA: Diagnosis not present

## 2021-07-25 DIAGNOSIS — R7981 Abnormal blood-gas level: Secondary | ICD-10-CM | POA: Diagnosis not present

## 2021-07-25 DIAGNOSIS — R0689 Other abnormalities of breathing: Secondary | ICD-10-CM | POA: Diagnosis not present

## 2021-07-25 DIAGNOSIS — Z20822 Contact with and (suspected) exposure to covid-19: Secondary | ICD-10-CM | POA: Diagnosis not present

## 2021-07-25 DIAGNOSIS — Z87891 Personal history of nicotine dependence: Secondary | ICD-10-CM | POA: Diagnosis not present

## 2021-07-25 DIAGNOSIS — J439 Emphysema, unspecified: Secondary | ICD-10-CM | POA: Diagnosis not present

## 2021-07-28 DIAGNOSIS — J9610 Chronic respiratory failure, unspecified whether with hypoxia or hypercapnia: Secondary | ICD-10-CM | POA: Diagnosis not present

## 2021-07-28 DIAGNOSIS — J449 Chronic obstructive pulmonary disease, unspecified: Secondary | ICD-10-CM | POA: Diagnosis not present

## 2021-07-30 DIAGNOSIS — R0689 Other abnormalities of breathing: Secondary | ICD-10-CM | POA: Diagnosis not present

## 2021-07-31 DIAGNOSIS — E782 Mixed hyperlipidemia: Secondary | ICD-10-CM | POA: Diagnosis not present

## 2021-08-04 DIAGNOSIS — Z7951 Long term (current) use of inhaled steroids: Secondary | ICD-10-CM | POA: Diagnosis not present

## 2021-08-04 DIAGNOSIS — Z87891 Personal history of nicotine dependence: Secondary | ICD-10-CM | POA: Diagnosis not present

## 2021-08-04 DIAGNOSIS — I10 Essential (primary) hypertension: Secondary | ICD-10-CM | POA: Diagnosis not present

## 2021-08-04 DIAGNOSIS — R0689 Other abnormalities of breathing: Secondary | ICD-10-CM | POA: Diagnosis not present

## 2021-08-04 DIAGNOSIS — R0600 Dyspnea, unspecified: Secondary | ICD-10-CM | POA: Diagnosis not present

## 2021-08-04 DIAGNOSIS — R42 Dizziness and giddiness: Secondary | ICD-10-CM | POA: Diagnosis not present

## 2021-08-04 DIAGNOSIS — R110 Nausea: Secondary | ICD-10-CM | POA: Diagnosis not present

## 2021-08-04 DIAGNOSIS — Z609 Problem related to social environment, unspecified: Secondary | ICD-10-CM | POA: Diagnosis not present

## 2021-08-04 DIAGNOSIS — Z20822 Contact with and (suspected) exposure to covid-19: Secondary | ICD-10-CM | POA: Diagnosis not present

## 2021-08-04 DIAGNOSIS — R112 Nausea with vomiting, unspecified: Secondary | ICD-10-CM | POA: Diagnosis not present

## 2021-08-05 DIAGNOSIS — R42 Dizziness and giddiness: Secondary | ICD-10-CM | POA: Diagnosis not present

## 2021-08-05 DIAGNOSIS — R0600 Dyspnea, unspecified: Secondary | ICD-10-CM | POA: Diagnosis not present

## 2021-08-08 DIAGNOSIS — I50811 Acute right heart failure: Secondary | ICD-10-CM | POA: Diagnosis not present

## 2021-08-08 DIAGNOSIS — E782 Mixed hyperlipidemia: Secondary | ICD-10-CM | POA: Diagnosis not present

## 2021-08-08 DIAGNOSIS — N1831 Chronic kidney disease, stage 3a: Secondary | ICD-10-CM | POA: Diagnosis not present

## 2021-08-08 DIAGNOSIS — J449 Chronic obstructive pulmonary disease, unspecified: Secondary | ICD-10-CM | POA: Diagnosis not present

## 2021-08-14 DIAGNOSIS — R079 Chest pain, unspecified: Secondary | ICD-10-CM | POA: Diagnosis not present

## 2021-08-14 DIAGNOSIS — I10 Essential (primary) hypertension: Secondary | ICD-10-CM | POA: Diagnosis not present

## 2021-08-14 DIAGNOSIS — J439 Emphysema, unspecified: Secondary | ICD-10-CM | POA: Diagnosis not present

## 2021-08-14 DIAGNOSIS — R6889 Other general symptoms and signs: Secondary | ICD-10-CM | POA: Diagnosis not present

## 2021-08-14 DIAGNOSIS — R4020 Unspecified coma: Secondary | ICD-10-CM | POA: Diagnosis not present

## 2021-08-14 DIAGNOSIS — Z743 Need for continuous supervision: Secondary | ICD-10-CM | POA: Diagnosis not present

## 2021-08-14 DIAGNOSIS — I272 Pulmonary hypertension, unspecified: Secondary | ICD-10-CM | POA: Diagnosis not present

## 2021-08-14 DIAGNOSIS — R404 Transient alteration of awareness: Secondary | ICD-10-CM | POA: Diagnosis not present

## 2021-08-14 DIAGNOSIS — R55 Syncope and collapse: Secondary | ICD-10-CM | POA: Diagnosis not present

## 2021-08-15 DIAGNOSIS — N1831 Chronic kidney disease, stage 3a: Secondary | ICD-10-CM | POA: Diagnosis not present

## 2021-08-15 DIAGNOSIS — R55 Syncope and collapse: Secondary | ICD-10-CM | POA: Diagnosis not present

## 2021-08-17 DIAGNOSIS — K219 Gastro-esophageal reflux disease without esophagitis: Secondary | ICD-10-CM | POA: Diagnosis not present

## 2021-08-17 DIAGNOSIS — R404 Transient alteration of awareness: Secondary | ICD-10-CM | POA: Diagnosis not present

## 2021-08-17 DIAGNOSIS — Z743 Need for continuous supervision: Secondary | ICD-10-CM | POA: Diagnosis not present

## 2021-08-17 DIAGNOSIS — Z96641 Presence of right artificial hip joint: Secondary | ICD-10-CM | POA: Diagnosis not present

## 2021-08-17 DIAGNOSIS — N189 Chronic kidney disease, unspecified: Secondary | ICD-10-CM | POA: Diagnosis not present

## 2021-08-17 DIAGNOSIS — J441 Chronic obstructive pulmonary disease with (acute) exacerbation: Secondary | ICD-10-CM | POA: Diagnosis not present

## 2021-08-17 DIAGNOSIS — Z87891 Personal history of nicotine dependence: Secondary | ICD-10-CM | POA: Diagnosis not present

## 2021-08-17 DIAGNOSIS — R6889 Other general symptoms and signs: Secondary | ICD-10-CM | POA: Diagnosis not present

## 2021-08-17 DIAGNOSIS — J9612 Chronic respiratory failure with hypercapnia: Secondary | ICD-10-CM | POA: Diagnosis not present

## 2021-08-17 DIAGNOSIS — I499 Cardiac arrhythmia, unspecified: Secondary | ICD-10-CM | POA: Diagnosis not present

## 2021-08-17 DIAGNOSIS — J9622 Acute and chronic respiratory failure with hypercapnia: Secondary | ICD-10-CM | POA: Diagnosis not present

## 2021-08-17 DIAGNOSIS — Z66 Do not resuscitate: Secondary | ICD-10-CM | POA: Diagnosis not present

## 2021-08-17 DIAGNOSIS — R079 Chest pain, unspecified: Secondary | ICD-10-CM | POA: Diagnosis not present

## 2021-08-17 DIAGNOSIS — I272 Pulmonary hypertension, unspecified: Secondary | ICD-10-CM | POA: Diagnosis not present

## 2021-08-17 DIAGNOSIS — I129 Hypertensive chronic kidney disease with stage 1 through stage 4 chronic kidney disease, or unspecified chronic kidney disease: Secondary | ICD-10-CM | POA: Diagnosis not present

## 2021-08-17 DIAGNOSIS — N179 Acute kidney failure, unspecified: Secondary | ICD-10-CM | POA: Diagnosis not present

## 2021-08-17 DIAGNOSIS — Z7951 Long term (current) use of inhaled steroids: Secondary | ICD-10-CM | POA: Diagnosis not present

## 2021-08-17 DIAGNOSIS — I440 Atrioventricular block, first degree: Secondary | ICD-10-CM | POA: Diagnosis not present

## 2021-08-17 DIAGNOSIS — D649 Anemia, unspecified: Secondary | ICD-10-CM | POA: Diagnosis not present

## 2021-08-17 DIAGNOSIS — J9611 Chronic respiratory failure with hypoxia: Secondary | ICD-10-CM | POA: Diagnosis not present

## 2021-08-17 DIAGNOSIS — Z79899 Other long term (current) drug therapy: Secondary | ICD-10-CM | POA: Diagnosis not present

## 2021-08-17 DIAGNOSIS — Z9981 Dependence on supplemental oxygen: Secondary | ICD-10-CM | POA: Diagnosis not present

## 2021-08-17 DIAGNOSIS — I471 Supraventricular tachycardia: Secondary | ICD-10-CM | POA: Diagnosis not present

## 2021-08-17 DIAGNOSIS — J9621 Acute and chronic respiratory failure with hypoxia: Secondary | ICD-10-CM | POA: Diagnosis not present

## 2021-08-17 DIAGNOSIS — J432 Centrilobular emphysema: Secondary | ICD-10-CM | POA: Diagnosis not present

## 2021-08-17 DIAGNOSIS — Z20822 Contact with and (suspected) exposure to covid-19: Secondary | ICD-10-CM | POA: Diagnosis not present

## 2021-08-18 DIAGNOSIS — I272 Pulmonary hypertension, unspecified: Secondary | ICD-10-CM | POA: Diagnosis not present

## 2021-08-18 DIAGNOSIS — R791 Abnormal coagulation profile: Secondary | ICD-10-CM | POA: Diagnosis not present

## 2021-08-18 DIAGNOSIS — R079 Chest pain, unspecified: Secondary | ICD-10-CM | POA: Diagnosis not present

## 2021-08-18 DIAGNOSIS — I471 Supraventricular tachycardia: Secondary | ICD-10-CM | POA: Diagnosis not present

## 2021-08-18 DIAGNOSIS — R918 Other nonspecific abnormal finding of lung field: Secondary | ICD-10-CM | POA: Diagnosis not present

## 2021-08-18 DIAGNOSIS — R0600 Dyspnea, unspecified: Secondary | ICD-10-CM | POA: Diagnosis not present

## 2021-08-18 DIAGNOSIS — I456 Pre-excitation syndrome: Secondary | ICD-10-CM | POA: Diagnosis not present

## 2021-08-18 DIAGNOSIS — J432 Centrilobular emphysema: Secondary | ICD-10-CM | POA: Diagnosis not present

## 2021-08-20 DIAGNOSIS — J441 Chronic obstructive pulmonary disease with (acute) exacerbation: Secondary | ICD-10-CM | POA: Diagnosis not present

## 2021-08-21 DIAGNOSIS — R0902 Hypoxemia: Secondary | ICD-10-CM | POA: Diagnosis not present

## 2021-08-21 DIAGNOSIS — G4733 Obstructive sleep apnea (adult) (pediatric): Secondary | ICD-10-CM | POA: Diagnosis not present

## 2021-08-21 DIAGNOSIS — Z9981 Dependence on supplemental oxygen: Secondary | ICD-10-CM | POA: Diagnosis not present

## 2021-08-21 DIAGNOSIS — I272 Pulmonary hypertension, unspecified: Secondary | ICD-10-CM | POA: Diagnosis not present

## 2021-08-21 DIAGNOSIS — I3481 Nonrheumatic mitral (valve) annulus calcification: Secondary | ICD-10-CM | POA: Diagnosis not present

## 2021-08-22 DIAGNOSIS — J449 Chronic obstructive pulmonary disease, unspecified: Secondary | ICD-10-CM | POA: Diagnosis not present

## 2021-08-23 DIAGNOSIS — J9610 Chronic respiratory failure, unspecified whether with hypoxia or hypercapnia: Secondary | ICD-10-CM | POA: Diagnosis not present

## 2021-08-23 DIAGNOSIS — I270 Primary pulmonary hypertension: Secondary | ICD-10-CM | POA: Diagnosis not present

## 2021-08-23 DIAGNOSIS — Z7189 Other specified counseling: Secondary | ICD-10-CM | POA: Diagnosis not present

## 2021-08-23 DIAGNOSIS — Z7689 Persons encountering health services in other specified circumstances: Secondary | ICD-10-CM | POA: Diagnosis not present

## 2021-08-23 DIAGNOSIS — J449 Chronic obstructive pulmonary disease, unspecified: Secondary | ICD-10-CM | POA: Diagnosis not present

## 2021-08-25 DIAGNOSIS — J9612 Chronic respiratory failure with hypercapnia: Secondary | ICD-10-CM | POA: Diagnosis not present

## 2021-08-25 DIAGNOSIS — Z7984 Long term (current) use of oral hypoglycemic drugs: Secondary | ICD-10-CM | POA: Diagnosis not present

## 2021-08-25 DIAGNOSIS — D631 Anemia in chronic kidney disease: Secondary | ICD-10-CM | POA: Diagnosis not present

## 2021-08-25 DIAGNOSIS — Z87891 Personal history of nicotine dependence: Secondary | ICD-10-CM | POA: Diagnosis not present

## 2021-08-25 DIAGNOSIS — I471 Supraventricular tachycardia: Secondary | ICD-10-CM | POA: Diagnosis not present

## 2021-08-25 DIAGNOSIS — I129 Hypertensive chronic kidney disease with stage 1 through stage 4 chronic kidney disease, or unspecified chronic kidney disease: Secondary | ICD-10-CM | POA: Diagnosis not present

## 2021-08-25 DIAGNOSIS — N189 Chronic kidney disease, unspecified: Secondary | ICD-10-CM | POA: Diagnosis not present

## 2021-08-25 DIAGNOSIS — J439 Emphysema, unspecified: Secondary | ICD-10-CM | POA: Diagnosis not present

## 2021-08-25 DIAGNOSIS — Z9981 Dependence on supplemental oxygen: Secondary | ICD-10-CM | POA: Diagnosis not present

## 2021-08-25 DIAGNOSIS — Z96641 Presence of right artificial hip joint: Secondary | ICD-10-CM | POA: Diagnosis not present

## 2021-08-25 DIAGNOSIS — J9611 Chronic respiratory failure with hypoxia: Secondary | ICD-10-CM | POA: Diagnosis not present

## 2021-08-25 DIAGNOSIS — N179 Acute kidney failure, unspecified: Secondary | ICD-10-CM | POA: Diagnosis not present

## 2021-08-25 DIAGNOSIS — K219 Gastro-esophageal reflux disease without esophagitis: Secondary | ICD-10-CM | POA: Diagnosis not present

## 2021-08-25 DIAGNOSIS — I456 Pre-excitation syndrome: Secondary | ICD-10-CM | POA: Diagnosis not present

## 2021-08-25 DIAGNOSIS — I272 Pulmonary hypertension, unspecified: Secondary | ICD-10-CM | POA: Diagnosis not present

## 2021-08-28 DIAGNOSIS — J9610 Chronic respiratory failure, unspecified whether with hypoxia or hypercapnia: Secondary | ICD-10-CM | POA: Diagnosis not present

## 2021-08-28 DIAGNOSIS — J449 Chronic obstructive pulmonary disease, unspecified: Secondary | ICD-10-CM | POA: Diagnosis not present

## 2021-08-29 DIAGNOSIS — K219 Gastro-esophageal reflux disease without esophagitis: Secondary | ICD-10-CM | POA: Diagnosis not present

## 2021-08-29 DIAGNOSIS — Z87891 Personal history of nicotine dependence: Secondary | ICD-10-CM | POA: Diagnosis not present

## 2021-08-29 DIAGNOSIS — D631 Anemia in chronic kidney disease: Secondary | ICD-10-CM | POA: Diagnosis not present

## 2021-08-29 DIAGNOSIS — J439 Emphysema, unspecified: Secondary | ICD-10-CM | POA: Diagnosis not present

## 2021-08-29 DIAGNOSIS — I471 Supraventricular tachycardia: Secondary | ICD-10-CM | POA: Diagnosis not present

## 2021-08-29 DIAGNOSIS — N179 Acute kidney failure, unspecified: Secondary | ICD-10-CM | POA: Diagnosis not present

## 2021-08-29 DIAGNOSIS — J9612 Chronic respiratory failure with hypercapnia: Secondary | ICD-10-CM | POA: Diagnosis not present

## 2021-08-29 DIAGNOSIS — J9611 Chronic respiratory failure with hypoxia: Secondary | ICD-10-CM | POA: Diagnosis not present

## 2021-08-29 DIAGNOSIS — I129 Hypertensive chronic kidney disease with stage 1 through stage 4 chronic kidney disease, or unspecified chronic kidney disease: Secondary | ICD-10-CM | POA: Diagnosis not present

## 2021-08-29 DIAGNOSIS — I456 Pre-excitation syndrome: Secondary | ICD-10-CM | POA: Diagnosis not present

## 2021-08-29 DIAGNOSIS — Z7984 Long term (current) use of oral hypoglycemic drugs: Secondary | ICD-10-CM | POA: Diagnosis not present

## 2021-08-29 DIAGNOSIS — N189 Chronic kidney disease, unspecified: Secondary | ICD-10-CM | POA: Diagnosis not present

## 2021-08-29 DIAGNOSIS — Z9981 Dependence on supplemental oxygen: Secondary | ICD-10-CM | POA: Diagnosis not present

## 2021-08-29 DIAGNOSIS — Z96641 Presence of right artificial hip joint: Secondary | ICD-10-CM | POA: Diagnosis not present

## 2021-08-29 DIAGNOSIS — I272 Pulmonary hypertension, unspecified: Secondary | ICD-10-CM | POA: Diagnosis not present

## 2021-09-05 DIAGNOSIS — D631 Anemia in chronic kidney disease: Secondary | ICD-10-CM | POA: Diagnosis not present

## 2021-09-05 DIAGNOSIS — I129 Hypertensive chronic kidney disease with stage 1 through stage 4 chronic kidney disease, or unspecified chronic kidney disease: Secondary | ICD-10-CM | POA: Diagnosis not present

## 2021-09-05 DIAGNOSIS — J9612 Chronic respiratory failure with hypercapnia: Secondary | ICD-10-CM | POA: Diagnosis not present

## 2021-09-05 DIAGNOSIS — I456 Pre-excitation syndrome: Secondary | ICD-10-CM | POA: Diagnosis not present

## 2021-09-05 DIAGNOSIS — Z7984 Long term (current) use of oral hypoglycemic drugs: Secondary | ICD-10-CM | POA: Diagnosis not present

## 2021-09-05 DIAGNOSIS — I471 Supraventricular tachycardia: Secondary | ICD-10-CM | POA: Diagnosis not present

## 2021-09-05 DIAGNOSIS — N189 Chronic kidney disease, unspecified: Secondary | ICD-10-CM | POA: Diagnosis not present

## 2021-09-05 DIAGNOSIS — Z87891 Personal history of nicotine dependence: Secondary | ICD-10-CM | POA: Diagnosis not present

## 2021-09-05 DIAGNOSIS — J9611 Chronic respiratory failure with hypoxia: Secondary | ICD-10-CM | POA: Diagnosis not present

## 2021-09-05 DIAGNOSIS — K219 Gastro-esophageal reflux disease without esophagitis: Secondary | ICD-10-CM | POA: Diagnosis not present

## 2021-09-05 DIAGNOSIS — Z96641 Presence of right artificial hip joint: Secondary | ICD-10-CM | POA: Diagnosis not present

## 2021-09-05 DIAGNOSIS — J439 Emphysema, unspecified: Secondary | ICD-10-CM | POA: Diagnosis not present

## 2021-09-05 DIAGNOSIS — Z9981 Dependence on supplemental oxygen: Secondary | ICD-10-CM | POA: Diagnosis not present

## 2021-09-05 DIAGNOSIS — N179 Acute kidney failure, unspecified: Secondary | ICD-10-CM | POA: Diagnosis not present

## 2021-09-05 DIAGNOSIS — I272 Pulmonary hypertension, unspecified: Secondary | ICD-10-CM | POA: Diagnosis not present

## 2021-09-07 ENCOUNTER — Telehealth: Payer: Self-pay

## 2021-09-07 NOTE — Telephone Encounter (Signed)
Volunteer for palliative care call to patient. Pt doing well

## 2021-09-07 NOTE — Telephone Encounter (Signed)
Volunteer call to check on palliative patient, no answer

## 2021-09-11 DIAGNOSIS — J9611 Chronic respiratory failure with hypoxia: Secondary | ICD-10-CM | POA: Diagnosis not present

## 2021-09-11 DIAGNOSIS — I272 Pulmonary hypertension, unspecified: Secondary | ICD-10-CM | POA: Diagnosis not present

## 2021-09-11 DIAGNOSIS — N189 Chronic kidney disease, unspecified: Secondary | ICD-10-CM | POA: Diagnosis not present

## 2021-09-11 DIAGNOSIS — J9612 Chronic respiratory failure with hypercapnia: Secondary | ICD-10-CM | POA: Diagnosis not present

## 2021-09-11 DIAGNOSIS — Z87891 Personal history of nicotine dependence: Secondary | ICD-10-CM | POA: Diagnosis not present

## 2021-09-11 DIAGNOSIS — N179 Acute kidney failure, unspecified: Secondary | ICD-10-CM | POA: Diagnosis not present

## 2021-09-11 DIAGNOSIS — K219 Gastro-esophageal reflux disease without esophagitis: Secondary | ICD-10-CM | POA: Diagnosis not present

## 2021-09-11 DIAGNOSIS — I456 Pre-excitation syndrome: Secondary | ICD-10-CM | POA: Diagnosis not present

## 2021-09-11 DIAGNOSIS — I471 Supraventricular tachycardia: Secondary | ICD-10-CM | POA: Diagnosis not present

## 2021-09-11 DIAGNOSIS — J439 Emphysema, unspecified: Secondary | ICD-10-CM | POA: Diagnosis not present

## 2021-09-11 DIAGNOSIS — Z7984 Long term (current) use of oral hypoglycemic drugs: Secondary | ICD-10-CM | POA: Diagnosis not present

## 2021-09-11 DIAGNOSIS — D631 Anemia in chronic kidney disease: Secondary | ICD-10-CM | POA: Diagnosis not present

## 2021-09-11 DIAGNOSIS — Z96641 Presence of right artificial hip joint: Secondary | ICD-10-CM | POA: Diagnosis not present

## 2021-09-11 DIAGNOSIS — Z9981 Dependence on supplemental oxygen: Secondary | ICD-10-CM | POA: Diagnosis not present

## 2021-09-11 DIAGNOSIS — I129 Hypertensive chronic kidney disease with stage 1 through stage 4 chronic kidney disease, or unspecified chronic kidney disease: Secondary | ICD-10-CM | POA: Diagnosis not present

## 2021-09-12 ENCOUNTER — Telehealth: Payer: Self-pay | Admitting: Pharmacy Technician

## 2021-09-12 DIAGNOSIS — Z596 Low income: Secondary | ICD-10-CM

## 2021-09-12 NOTE — Progress Notes (Signed)
[name])                                            [place] Team    09/12/2021  Kristen Montgomery [birth year] [id number]                                      Medication Assistance Referral  Referral From: [place]  Medication/Company: Spiriva Respimat 2.61mg / BI Patient application portion:  Mailed Provider application portion: Faxed  to Dr. BMarco CollieProvider address/fax verified via: Office website  Medication/Company: Symbicort 163m / [place] Patient application portion:  Mailed Provider application portion: Faxed  to Dr. BeMarco Collierovider address/fax verified via: Office website  Medication/Company: FaWilder Glade072m [place] Patient application portion:  Mailed Provider application portion: Faxed  to Dr. BetMarco Collieovider address/fax verified via: Office website    Casten Floren P. Muhannad Bignell, [phone]

## 2021-09-18 DIAGNOSIS — J9611 Chronic respiratory failure with hypoxia: Secondary | ICD-10-CM | POA: Diagnosis not present

## 2021-09-18 DIAGNOSIS — Z7984 Long term (current) use of oral hypoglycemic drugs: Secondary | ICD-10-CM | POA: Diagnosis not present

## 2021-09-18 DIAGNOSIS — I456 Pre-excitation syndrome: Secondary | ICD-10-CM | POA: Diagnosis not present

## 2021-09-18 DIAGNOSIS — N179 Acute kidney failure, unspecified: Secondary | ICD-10-CM | POA: Diagnosis not present

## 2021-09-18 DIAGNOSIS — K219 Gastro-esophageal reflux disease without esophagitis: Secondary | ICD-10-CM | POA: Diagnosis not present

## 2021-09-18 DIAGNOSIS — Z96641 Presence of right artificial hip joint: Secondary | ICD-10-CM | POA: Diagnosis not present

## 2021-09-18 DIAGNOSIS — I129 Hypertensive chronic kidney disease with stage 1 through stage 4 chronic kidney disease, or unspecified chronic kidney disease: Secondary | ICD-10-CM | POA: Diagnosis not present

## 2021-09-18 DIAGNOSIS — J9612 Chronic respiratory failure with hypercapnia: Secondary | ICD-10-CM | POA: Diagnosis not present

## 2021-09-18 DIAGNOSIS — N189 Chronic kidney disease, unspecified: Secondary | ICD-10-CM | POA: Diagnosis not present

## 2021-09-18 DIAGNOSIS — D631 Anemia in chronic kidney disease: Secondary | ICD-10-CM | POA: Diagnosis not present

## 2021-09-18 DIAGNOSIS — Z9981 Dependence on supplemental oxygen: Secondary | ICD-10-CM | POA: Diagnosis not present

## 2021-09-18 DIAGNOSIS — Z87891 Personal history of nicotine dependence: Secondary | ICD-10-CM | POA: Diagnosis not present

## 2021-09-18 DIAGNOSIS — J439 Emphysema, unspecified: Secondary | ICD-10-CM | POA: Diagnosis not present

## 2021-09-18 DIAGNOSIS — I272 Pulmonary hypertension, unspecified: Secondary | ICD-10-CM | POA: Diagnosis not present

## 2021-09-18 DIAGNOSIS — I471 Supraventricular tachycardia: Secondary | ICD-10-CM | POA: Diagnosis not present

## 2021-09-19 DIAGNOSIS — J449 Chronic obstructive pulmonary disease, unspecified: Secondary | ICD-10-CM | POA: Diagnosis not present

## 2021-09-25 DIAGNOSIS — J9610 Chronic respiratory failure, unspecified whether with hypoxia or hypercapnia: Secondary | ICD-10-CM | POA: Diagnosis not present

## 2021-09-25 DIAGNOSIS — J449 Chronic obstructive pulmonary disease, unspecified: Secondary | ICD-10-CM | POA: Diagnosis not present

## 2021-09-29 ENCOUNTER — Telehealth: Payer: Self-pay | Admitting: Pharmacy Technician

## 2021-09-29 DIAGNOSIS — Z596 Low income: Secondary | ICD-10-CM

## 2021-09-29 NOTE — Progress Notes (Signed)
[facility])  ?                                          [facility] Team ?  ? ?09/29/2021 ? ?Kristen Montgomery ?[birth year] ?[id number] ? ?Closing case for patient assistance for Spiriva with BI and Symbicort and Farxiga with AZ&ME as patient has passed away per voicemail message left by patient's daughter [name]. ? ?Wlliam Grosso P. Cordelle Dahmen, CPhT ?[place]  ?[phone] ? ?

## 2021-10-14 DEATH — deceased
